# Patient Record
Sex: Female | Born: 1955 | Race: White | Hispanic: No | Marital: Single | Smoking: Never smoker
Health system: Southern US, Community
[De-identification: ages and names within clinical notes are randomized; demographics above are authoritative.]

## PROBLEM LIST (undated history)

## (undated) DIAGNOSIS — E785 Hyperlipidemia, unspecified: Secondary | ICD-10-CM

## (undated) DIAGNOSIS — F419 Anxiety disorder, unspecified: Secondary | ICD-10-CM

## (undated) DIAGNOSIS — E0590 Thyrotoxicosis, unspecified without thyrotoxic crisis or storm: Secondary | ICD-10-CM

## (undated) DIAGNOSIS — G4733 Obstructive sleep apnea (adult) (pediatric): Secondary | ICD-10-CM

## (undated) DIAGNOSIS — N183 Chronic kidney disease, stage 3 unspecified: Secondary | ICD-10-CM

## (undated) DIAGNOSIS — G4730 Sleep apnea, unspecified: Secondary | ICD-10-CM

## (undated) DIAGNOSIS — I4510 Unspecified right bundle-branch block: Secondary | ICD-10-CM

## (undated) DIAGNOSIS — F329 Major depressive disorder, single episode, unspecified: Secondary | ICD-10-CM

## (undated) DIAGNOSIS — E7800 Pure hypercholesterolemia, unspecified: Secondary | ICD-10-CM

## (undated) DIAGNOSIS — E079 Disorder of thyroid, unspecified: Secondary | ICD-10-CM

## (undated) DIAGNOSIS — Z9889 Other specified postprocedural states: Secondary | ICD-10-CM

## (undated) DIAGNOSIS — F32A Depression, unspecified: Secondary | ICD-10-CM

## (undated) DIAGNOSIS — J9621 Acute and chronic respiratory failure with hypoxia: Secondary | ICD-10-CM

## (undated) DIAGNOSIS — I517 Cardiomegaly: Secondary | ICD-10-CM

## (undated) DIAGNOSIS — J189 Pneumonia, unspecified organism: Secondary | ICD-10-CM

## (undated) DIAGNOSIS — I10 Essential (primary) hypertension: Secondary | ICD-10-CM

## (undated) DIAGNOSIS — K219 Gastro-esophageal reflux disease without esophagitis: Secondary | ICD-10-CM

## (undated) DIAGNOSIS — D860 Sarcoidosis of lung: Secondary | ICD-10-CM

## (undated) DIAGNOSIS — G4731 Primary central sleep apnea: Secondary | ICD-10-CM

## (undated) HISTORY — DX: Other specified postprocedural states: Z98.890

## (undated) HISTORY — PX: RHINOPLASTY: SUR1284

## (undated) HISTORY — DX: Hyperlipidemia, unspecified: E78.5

## (undated) HISTORY — PX: CARPAL TUNNEL RELEASE: SHX101

## (undated) HISTORY — PX: TONSILLECTOMY: SUR1361

## (undated) HISTORY — DX: Anxiety disorder, unspecified: F41.9

## (undated) HISTORY — DX: Major depressive disorder, single episode, unspecified: F32.9

## (undated) HISTORY — PX: APPENDECTOMY: SHX54

## (undated) HISTORY — PX: ELBOW FRACTURE SURGERY: SHX616

## (undated) HISTORY — DX: Unspecified right bundle-branch block: I45.10

## (undated) HISTORY — DX: Chronic kidney disease, stage 3 unspecified: N18.30

## (undated) HISTORY — DX: Essential (primary) hypertension: I10

## (undated) HISTORY — DX: Sleep apnea, unspecified: G47.30

## (undated) HISTORY — DX: Gastro-esophageal reflux disease without esophagitis: K21.9

## (undated) HISTORY — PX: RETROPERITONEAL LYMPH NODE EXCISION: SUR551

## (undated) HISTORY — DX: Disorder of thyroid, unspecified: E07.9

## (undated) HISTORY — DX: Pneumonia, unspecified organism: J18.9

## (undated) HISTORY — DX: Depression, unspecified: F32.A

## (undated) HISTORY — DX: Sarcoidosis of lung: D86.0

## (undated) HISTORY — DX: Chronic kidney disease, stage 3 (moderate): N18.3

## (undated) HISTORY — DX: Cardiomegaly: I51.7

## (undated) HISTORY — PX: CHOLECYSTECTOMY: SHX55

## (undated) HISTORY — DX: Obstructive sleep apnea (adult) (pediatric): G47.33

## (undated) HISTORY — DX: Pure hypercholesterolemia, unspecified: E78.00

## (undated) HISTORY — PX: OTHER SURGICAL HISTORY: SHX169

## (undated) HISTORY — DX: Acute and chronic respiratory failure with hypoxia: J96.21

## (undated) HISTORY — DX: Primary central sleep apnea: G47.31

## (undated) HISTORY — DX: Thyrotoxicosis, unspecified without thyrotoxic crisis or storm: E05.90

---
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
## 1983-08-02 HISTORY — PX: RHINOPLASTY: SUR1284

## 1984-08-01 HISTORY — PX: OTHER SURGICAL HISTORY: SHX169

## 1995-08-02 HISTORY — PX: OTHER SURGICAL HISTORY: SHX169

## 1996-08-01 HISTORY — PX: CHOLECYSTECTOMY: SHX55

## 1996-08-01 HISTORY — PX: DG GALL BLADDER: HXRAD326

## 1998-07-13 ENCOUNTER — Encounter: Admission: RE | Admit: 1998-07-13 | Discharge: 1998-09-11 | Payer: Self-pay | Admitting: Internal Medicine

## 1998-09-11 ENCOUNTER — Encounter: Admission: RE | Admit: 1998-09-11 | Discharge: 1998-10-15 | Payer: Self-pay

## 1999-01-04 ENCOUNTER — Other Ambulatory Visit: Admission: RE | Admit: 1999-01-04 | Discharge: 1999-01-04 | Payer: Self-pay | Admitting: Gynecology

## 1999-02-08 ENCOUNTER — Encounter: Admission: RE | Admit: 1999-02-08 | Discharge: 1999-02-08 | Payer: Self-pay | Admitting: Family Medicine

## 1999-04-20 ENCOUNTER — Encounter: Admission: RE | Admit: 1999-04-20 | Discharge: 1999-04-20 | Payer: Self-pay | Admitting: Family Medicine

## 1999-08-02 HISTORY — PX: OTHER SURGICAL HISTORY: SHX169

## 1999-10-11 ENCOUNTER — Ambulatory Visit (HOSPITAL_COMMUNITY): Admission: RE | Admit: 1999-10-11 | Discharge: 1999-10-11 | Payer: Self-pay | Admitting: Neurosurgery

## 1999-10-11 ENCOUNTER — Encounter: Payer: Self-pay | Admitting: Neurosurgery

## 1999-11-03 ENCOUNTER — Encounter: Payer: Self-pay | Admitting: Neurosurgery

## 1999-11-05 ENCOUNTER — Inpatient Hospital Stay (HOSPITAL_COMMUNITY): Admission: RE | Admit: 1999-11-05 | Discharge: 1999-11-06 | Payer: Self-pay | Admitting: Neurosurgery

## 1999-11-05 ENCOUNTER — Encounter: Payer: Self-pay | Admitting: Neurosurgery

## 1999-12-02 ENCOUNTER — Encounter: Admission: RE | Admit: 1999-12-02 | Discharge: 1999-12-02 | Payer: Self-pay | Admitting: Family Medicine

## 1999-12-06 ENCOUNTER — Encounter: Admission: RE | Admit: 1999-12-06 | Discharge: 1999-12-06 | Payer: Self-pay | Admitting: Neurosurgery

## 1999-12-06 ENCOUNTER — Encounter: Payer: Self-pay | Admitting: Neurosurgery

## 1999-12-16 ENCOUNTER — Encounter: Admission: RE | Admit: 1999-12-16 | Discharge: 1999-12-16 | Payer: Self-pay | Admitting: Family Medicine

## 2000-02-03 ENCOUNTER — Encounter: Admission: RE | Admit: 2000-02-03 | Discharge: 2000-02-03 | Payer: Self-pay | Admitting: Neurosurgery

## 2000-02-03 ENCOUNTER — Encounter: Payer: Self-pay | Admitting: Neurosurgery

## 2000-02-10 ENCOUNTER — Encounter: Admission: RE | Admit: 2000-02-10 | Discharge: 2000-02-10 | Payer: Self-pay | Admitting: Family Medicine

## 2000-03-17 ENCOUNTER — Other Ambulatory Visit: Admission: RE | Admit: 2000-03-17 | Discharge: 2000-03-17 | Payer: Self-pay | Admitting: Gynecology

## 2000-04-26 ENCOUNTER — Encounter: Admission: RE | Admit: 2000-04-26 | Discharge: 2000-04-26 | Payer: Self-pay | Admitting: Family Medicine

## 2000-09-07 ENCOUNTER — Encounter: Admission: RE | Admit: 2000-09-07 | Discharge: 2000-09-07 | Payer: Self-pay | Admitting: Neurosurgery

## 2000-09-07 ENCOUNTER — Encounter: Payer: Self-pay | Admitting: Neurosurgery

## 2000-10-24 ENCOUNTER — Encounter: Admission: RE | Admit: 2000-10-24 | Discharge: 2000-10-24 | Payer: Self-pay | Admitting: Family Medicine

## 2000-11-06 ENCOUNTER — Encounter: Admission: RE | Admit: 2000-11-06 | Discharge: 2000-11-06 | Payer: Self-pay | Admitting: Family Medicine

## 2001-01-15 ENCOUNTER — Encounter: Admission: RE | Admit: 2001-01-15 | Discharge: 2001-04-15 | Payer: Self-pay | Admitting: *Deleted

## 2001-01-18 ENCOUNTER — Encounter: Admission: RE | Admit: 2001-01-18 | Discharge: 2001-01-18 | Payer: Self-pay | Admitting: Family Medicine

## 2001-02-07 ENCOUNTER — Encounter: Admission: RE | Admit: 2001-02-07 | Discharge: 2001-02-07 | Payer: Self-pay | Admitting: Family Medicine

## 2001-03-08 ENCOUNTER — Encounter: Admission: RE | Admit: 2001-03-08 | Discharge: 2001-03-08 | Payer: Self-pay | Admitting: Family Medicine

## 2001-04-05 ENCOUNTER — Encounter: Admission: RE | Admit: 2001-04-05 | Discharge: 2001-04-05 | Payer: Self-pay | Admitting: Family Medicine

## 2001-08-22 ENCOUNTER — Encounter: Admission: RE | Admit: 2001-08-22 | Discharge: 2001-08-22 | Payer: Self-pay | Admitting: Family Medicine

## 2001-09-24 ENCOUNTER — Encounter: Admission: RE | Admit: 2001-09-24 | Discharge: 2001-09-24 | Payer: Self-pay | Admitting: Family Medicine

## 2002-01-02 ENCOUNTER — Encounter: Admission: RE | Admit: 2002-01-02 | Discharge: 2002-01-02 | Payer: Self-pay | Admitting: Family Medicine

## 2002-04-12 ENCOUNTER — Encounter: Admission: RE | Admit: 2002-04-12 | Discharge: 2002-04-12 | Payer: Self-pay | Admitting: Family Medicine

## 2002-04-13 ENCOUNTER — Encounter (INDEPENDENT_AMBULATORY_CARE_PROVIDER_SITE_OTHER): Payer: Self-pay | Admitting: *Deleted

## 2002-04-13 ENCOUNTER — Inpatient Hospital Stay (HOSPITAL_COMMUNITY): Admission: EM | Admit: 2002-04-13 | Discharge: 2002-04-19 | Payer: Self-pay | Admitting: Emergency Medicine

## 2002-04-14 ENCOUNTER — Encounter: Payer: Self-pay | Admitting: Sports Medicine

## 2002-04-15 ENCOUNTER — Encounter: Payer: Self-pay | Admitting: Sports Medicine

## 2002-04-16 ENCOUNTER — Encounter: Payer: Self-pay | Admitting: Sports Medicine

## 2002-04-23 ENCOUNTER — Encounter: Admission: RE | Admit: 2002-04-23 | Discharge: 2002-04-23 | Payer: Self-pay | Admitting: Family Medicine

## 2002-04-24 ENCOUNTER — Encounter: Admission: RE | Admit: 2002-04-24 | Discharge: 2002-04-24 | Payer: Self-pay | Admitting: Sports Medicine

## 2002-04-30 ENCOUNTER — Encounter: Admission: RE | Admit: 2002-04-30 | Discharge: 2002-04-30 | Payer: Self-pay | Admitting: Sports Medicine

## 2002-05-08 ENCOUNTER — Encounter: Admission: RE | Admit: 2002-05-08 | Discharge: 2002-05-08 | Payer: Self-pay | Admitting: Family Medicine

## 2002-05-24 ENCOUNTER — Encounter: Admission: RE | Admit: 2002-05-24 | Discharge: 2002-05-24 | Payer: Self-pay | Admitting: Family Medicine

## 2002-09-06 ENCOUNTER — Encounter: Admission: RE | Admit: 2002-09-06 | Discharge: 2002-09-06 | Payer: Self-pay | Admitting: Family Medicine

## 2002-09-09 ENCOUNTER — Encounter: Admission: RE | Admit: 2002-09-09 | Discharge: 2002-09-09 | Payer: Self-pay | Admitting: Family Medicine

## 2002-09-18 ENCOUNTER — Encounter: Admission: RE | Admit: 2002-09-18 | Discharge: 2002-09-18 | Payer: Self-pay | Admitting: Family Medicine

## 2002-10-02 ENCOUNTER — Encounter: Admission: RE | Admit: 2002-10-02 | Discharge: 2002-10-02 | Payer: Self-pay | Admitting: Family Medicine

## 2002-10-16 ENCOUNTER — Encounter: Payer: Self-pay | Admitting: Family Medicine

## 2002-10-16 ENCOUNTER — Encounter: Admission: RE | Admit: 2002-10-16 | Discharge: 2002-10-16 | Payer: Self-pay | Admitting: Family Medicine

## 2002-10-31 ENCOUNTER — Encounter (INDEPENDENT_AMBULATORY_CARE_PROVIDER_SITE_OTHER): Payer: Self-pay | Admitting: *Deleted

## 2002-10-31 DIAGNOSIS — J189 Pneumonia, unspecified organism: Secondary | ICD-10-CM

## 2002-10-31 HISTORY — DX: Pneumonia, unspecified organism: J18.9

## 2002-11-04 ENCOUNTER — Encounter: Admission: RE | Admit: 2002-11-04 | Discharge: 2002-11-04 | Payer: Self-pay | Admitting: Family Medicine

## 2002-11-04 ENCOUNTER — Encounter: Admission: RE | Admit: 2002-11-04 | Discharge: 2002-11-04 | Payer: Self-pay | Admitting: Sports Medicine

## 2002-11-04 ENCOUNTER — Encounter: Payer: Self-pay | Admitting: Sports Medicine

## 2002-11-08 ENCOUNTER — Emergency Department (HOSPITAL_COMMUNITY): Admission: EM | Admit: 2002-11-08 | Discharge: 2002-11-08 | Payer: Self-pay | Admitting: Emergency Medicine

## 2002-11-12 ENCOUNTER — Encounter (INDEPENDENT_AMBULATORY_CARE_PROVIDER_SITE_OTHER): Payer: Self-pay | Admitting: *Deleted

## 2002-11-12 ENCOUNTER — Encounter: Admission: RE | Admit: 2002-11-12 | Discharge: 2002-11-12 | Payer: Self-pay | Admitting: Sports Medicine

## 2004-02-25 ENCOUNTER — Encounter: Admission: RE | Admit: 2004-02-25 | Discharge: 2004-02-25 | Payer: Self-pay | Admitting: Family Medicine

## 2004-02-25 ENCOUNTER — Ambulatory Visit (HOSPITAL_COMMUNITY): Admission: RE | Admit: 2004-02-25 | Discharge: 2004-02-25 | Payer: Self-pay | Admitting: *Deleted

## 2004-11-12 ENCOUNTER — Encounter: Admission: RE | Admit: 2004-11-12 | Discharge: 2004-11-12 | Payer: Self-pay | Admitting: Orthopedic Surgery

## 2006-02-23 ENCOUNTER — Other Ambulatory Visit: Admission: RE | Admit: 2006-02-23 | Discharge: 2006-02-23 | Payer: Self-pay | Admitting: Obstetrics & Gynecology

## 2006-04-04 ENCOUNTER — Encounter: Admission: RE | Admit: 2006-04-04 | Discharge: 2006-04-04 | Payer: Self-pay | Admitting: Obstetrics & Gynecology

## 2006-07-01 HISTORY — PX: ABDOMINAL HYSTERECTOMY: SHX81

## 2006-07-10 ENCOUNTER — Encounter (INDEPENDENT_AMBULATORY_CARE_PROVIDER_SITE_OTHER): Payer: Self-pay | Admitting: Specialist

## 2006-07-10 ENCOUNTER — Inpatient Hospital Stay (HOSPITAL_COMMUNITY): Admission: RE | Admit: 2006-07-10 | Discharge: 2006-07-12 | Payer: Self-pay | Admitting: Obstetrics & Gynecology

## 2006-07-14 ENCOUNTER — Inpatient Hospital Stay (HOSPITAL_COMMUNITY): Admission: EM | Admit: 2006-07-14 | Discharge: 2006-07-15 | Payer: Self-pay | Admitting: Emergency Medicine

## 2006-08-16 ENCOUNTER — Emergency Department (HOSPITAL_COMMUNITY): Admission: EM | Admit: 2006-08-16 | Discharge: 2006-08-16 | Payer: Self-pay | Admitting: Emergency Medicine

## 2006-08-23 ENCOUNTER — Ambulatory Visit: Payer: Self-pay | Admitting: Gastroenterology

## 2006-08-24 ENCOUNTER — Encounter (INDEPENDENT_AMBULATORY_CARE_PROVIDER_SITE_OTHER): Payer: Self-pay | Admitting: Specialist

## 2006-08-24 ENCOUNTER — Ambulatory Visit: Payer: Self-pay | Admitting: Gastroenterology

## 2006-09-07 ENCOUNTER — Ambulatory Visit: Payer: Self-pay | Admitting: Gastroenterology

## 2006-09-28 DIAGNOSIS — J309 Allergic rhinitis, unspecified: Secondary | ICD-10-CM | POA: Insufficient documentation

## 2006-09-28 DIAGNOSIS — G4730 Sleep apnea, unspecified: Secondary | ICD-10-CM | POA: Insufficient documentation

## 2006-09-28 DIAGNOSIS — E782 Mixed hyperlipidemia: Secondary | ICD-10-CM | POA: Insufficient documentation

## 2006-09-28 DIAGNOSIS — L989 Disorder of the skin and subcutaneous tissue, unspecified: Secondary | ICD-10-CM | POA: Insufficient documentation

## 2006-09-28 DIAGNOSIS — M5382 Other specified dorsopathies, cervical region: Secondary | ICD-10-CM | POA: Insufficient documentation

## 2006-09-28 DIAGNOSIS — D869 Sarcoidosis, unspecified: Secondary | ICD-10-CM | POA: Insufficient documentation

## 2006-09-28 DIAGNOSIS — G4700 Insomnia, unspecified: Secondary | ICD-10-CM | POA: Insufficient documentation

## 2006-09-28 DIAGNOSIS — E118 Type 2 diabetes mellitus with unspecified complications: Secondary | ICD-10-CM | POA: Insufficient documentation

## 2006-09-28 DIAGNOSIS — E039 Hypothyroidism, unspecified: Secondary | ICD-10-CM | POA: Insufficient documentation

## 2006-09-28 DIAGNOSIS — I10 Essential (primary) hypertension: Secondary | ICD-10-CM | POA: Insufficient documentation

## 2006-09-28 DIAGNOSIS — F339 Major depressive disorder, recurrent, unspecified: Secondary | ICD-10-CM | POA: Insufficient documentation

## 2006-09-29 ENCOUNTER — Encounter (INDEPENDENT_AMBULATORY_CARE_PROVIDER_SITE_OTHER): Payer: Self-pay | Admitting: *Deleted

## 2009-02-03 ENCOUNTER — Encounter: Admission: RE | Admit: 2009-02-03 | Discharge: 2009-02-03 | Payer: Self-pay | Admitting: Family Medicine

## 2009-05-15 ENCOUNTER — Encounter: Admission: RE | Admit: 2009-05-15 | Discharge: 2009-05-15 | Payer: Self-pay | Admitting: Psychiatry

## 2009-05-16 ENCOUNTER — Encounter: Admission: RE | Admit: 2009-05-16 | Discharge: 2009-05-16 | Payer: Self-pay | Admitting: Psychiatry

## 2010-02-10 DIAGNOSIS — Z9889 Other specified postprocedural states: Secondary | ICD-10-CM

## 2010-02-10 HISTORY — DX: Other specified postprocedural states: Z98.890

## 2010-12-17 NOTE — Consult Note (Signed)
NAMEMELANNY, WIRE LU                          ACCOUNT NO.:  [id number]   MEDICAL RECORD NO.:  [id number]                   PATIENT TYPE:  INP   LOCATION:  3033                                 FACILITY:  [facility]   PHYSICIAN:  Oley Balm. Sung Amabile, M.D. [facility]          DATE OF BIRTH:  03-24-1956   DATE OF CONSULTATION:  04/16/2002  DATE OF DISCHARGE:                                   CONSULTATION   PULMONARY CONSULTATION   REQUESTING PHYSICIAN:  Dr. Garnette Scheuermann, [facility]  [facility].   REASON FOR CONSULTATION:  Mediastinal adenopathy and left lower lobe  infiltrate and nodules.   HISTORY OF PRESENT ILLNESS:  The patient is a 55 year old woman who presents  with a several-month history of profound fatigue, weight loss (almost 100  pounds over the past year), chronic cough (better after discontinuation of  ACE inhibitor), nausea, vomiting, anorexia.  She had routine labs done on  April 12, 2002.  These demonstrated severe hypercalcemia and renal  insufficiency.  Consequently, she was contacted on April 13, 2002 and  arrangements were made for admission.  Since admission she has been treated  for the hypercalcemia with some improvement.  Renal function is also  improving.  She was noted on admission chest x-ray to have what appeared to  be bulky mediastinal adenopathy.  This was followed up with a CT scan, the  results of are discussed below.  Based on the findings on chest radiographs,  I am asked to evaluate for diagnostic intervention.   PAST MEDICAL HISTORY:  1. Diabetes.  2. Hypothyroidism.  3. Hypercholesterolemia.  4. Obstructive sleep apnea.  5. Cervical spondylosis.   CURRENT MEDICATIONS:  Prior to admission these were aspirin, Moduretic,  Synthroid.   SOCIAL HISTORY:  She never smoked and has no history of significant  occupational exposures.   FAMILY HISTORY:  Noncontributory.  No history of malignancies.   PHYSICAL EXAMINATION:  GENERAL:  Very pleasant woman in no acute cardiac or  respiratory distress.  VITAL SIGNS: She is afebrile with normal vital signs.  Room air oxygen  saturation is 97%.  HEENT: No acute abnormalities.  NECK: Supple, without palpable adenopathy.  CHEST: Normal percussion throughout.  There are some faint left basilar  crackles otherwise, no other adventitious sounds.  CARDIAC: Regular rate and rhythm, with no murmurs.  ABDOMEN: Moderately obese and otherwise benign.  EXTREMITIES: Without clubbing, cyanosis, and edema.  NEUROLOGICAL: No focal deficits.  LYMPHATICS: Lymph node survey demonstrates no palpable lymphadenopathy.   DATA:  Chemistries have been reviewed and are normal except a BUN of 24 and  a creatinine of 2.6.  Ionized calcium is elevated at 1.86.  Her calcium  level today is coming down and is presently at 11.9.  On admission it was  13.6.  Erythrocyte sedimentation rate was modestly elevated at 32 mm/hr.   X-RAY DATA:  CT scan of the chest demonstrates bulky  mediastinal adenopathy.  There is airspace density in the left lower lobe medially at the base.  There are also nodular densities seen predominantly in the left lower lobe.  Mild splenomegaly is noted.   IMPRESSION:  1. Symptom complex of fatigue, weight loss, anorexia for several months.  2. Severe hypercalcemia of unclear etiology.  3. Acute renal failure - most likely secondary to the hypercalcemia.  4. Bulky mediastinal adenopathy with no palpable peripheral adenopathy.  5. Left lower lobe airspace disease and pulmonary nodules.   DISCUSSION:  Differential diagnosis is metastatic carcinoma versus lymphoma  versus sarcoidosis, versus any other granulomatous process including  tuberculosis.  I think sarcoidosis is the most likely explanation for her  constellation of symptoms and laboratory findings.   PLAN/RECOMMENDATIONS:  Bronchoscopy has been scheduled for this afternoon  and I intend to perform transbronchial needle  aspirations as well as left  lower lobe transbronchial biopsies.  If bronchoscopy is nondiagnostic,  mediastinoscopy would render a diagnosis with a biopsy of the mediastinal  lymph nodes.                                               Oley Balm Sung Amabile, M.D. [facility]    DBS/MEDQ  D:  04/16/2002  T:  04/17/2002  Job:  [id number]   cc:   Sibyl Parr. Fields, M.D.  [address] [place]  [place] [address]  Fax: [phone]

## 2010-12-17 NOTE — Discharge Summary (Signed)
Rachel Kim, Rachel Kim                 ACCOUNT NO.:  [id number]   MEDICAL RECORD NO.:  [id number]          PATIENT TYPE:  INP   LOCATION:  9317                          FACILITY:  [facility]   PHYSICIAN:  M. Leda Quail, MD  DATE OF BIRTH:  [birth year]   DATE OF ADMISSION:  07/10/2006  DATE OF DISCHARGE:  07/12/2006                               DISCHARGE SUMMARY   ADMISSION DIAGNOSIS:  15. 55 year old white female with menorrhagia.  2. Adenomyosis on ultrasound.  3. Failed conservative management with progestin therapy.  4. Obesity.  5. Diabetes.  6. Sleep apnea.  7. Hypertension.   DISCHARGE DIAGNOSIS:  88. 55 year old white female with menorrhagia.  2. Adenomyosis on ultrasound.  3. Failed conservative management with progestin therapy.  4. Obesity.  5. Diabetes.  6. Sleep apnea.  7. Hypertension.   PROCEDURE:  Total abdominal hysterectomy with bilateral salpingo-  oophorectomy.   HISTORY OF PRESENT ILLNESS:  Written H&P is in the chart.  Briefly, Ms.  Violante is a 55 year old white female with multiple medical problems  including hypertension, sleep apnea, diabetes, and obesity, who has had  significant menorrhagia with resulting anemia.  She has been worked up  with transvaginal ultrasound which has showed adenomyosis.  She did  undergo an endometrial biopsy and was started on simply progestin  therapy which did not improve her symptoms, at all.  We had talked about  conservative management but because of the findings of adenomyosis on  ultrasound, I was afraid that ablation may not work for her.  She has  decided to proceed with definitive management.  She has been cleared by  cardiology.   HOSPITAL COURSE:  The patient was admitted through same day surgery and  taken to the operating room where a TAH and BSO was performed.  The  surgery was difficult due to the patient's obesity, but was performed  without complications.  After the surgery, the patient was taken to the  recovery room and to the third floor for the remainder of her hospital  stay.  Her hospital course was uncomplicated.  On the morning of  postoperative day one, she was afebrile with stable vital signs.  She  had good pain control.  The Foley catheter was discontinued and she was  able to void without difficulty.  Postop hemoglobin was 11.2, white  count 12.7, electrolytes were normal with BUN 18 and creatinine 1.8.  This was a bump from her baseline of 18 and 1.3.  I did call the  pharmacy to call with them about any medication that she might be on  that could have contributed to this and discovered that Toradol was an  ACE inhibitor and can cause a bump in creatinine.  As a result, we  stopped her Toradol on the morning of postoperative day one.  She was on  Glucometer for 12 hours postop but had excellent blood sugar control.  From there, she was changed to an ADA diet and started on q.a.c. and  q.h.s. finger stick blood sugars with sliding scale insulin as  necessary.  She did have  good blood sugars throughout her  hospitalization.   By the evening of postoperative day one, she was voiding without  difficulty, ambulating well, tolerating a regular diet, and had  excellent pain control with oral pain medicines.  Her IV was completely  discontinued at this time.  She is off antibiotics and IV pain  medicines.  In the morning of postop day two, the patient was doing very  well.  Again, she was afebrile with stable vital signs.  Blood sugars  had been well controlled.  She had no trouble in the evening.  Repeat  BUN and creatinine were 19 and 1.6.  Because it was coming down, I felt  more reassured.  She was encouraged not to use Motrin at home and just  use a narcotic for pain medication.  I did talk with her primary care  physician.  We decided that we would recheck her BUN and creatinine in  one week to insure that it had normalized.   At this point, the patient had met all criteria  for discharge and was  discharged to home with friends.   Pathology showed proliferative endometrium with a benign endometrial  polyp, no hyperplasia or carcinoma identified, a submucosal leiomyoma  was noted, the cervix was unremarkable.  Right and left ovaries showed  hemorrhagic corpus luteal cyst without endometriosis or evidence of  malignancy and the fallopian tubes were unremarkable.      Lum Keas, MD  Electronically Signed     MSM/MEDQ  D:  08/09/2006  T:  08/09/2006  Job:  [id number]

## 2010-12-17 NOTE — Assessment & Plan Note (Signed)
[facility]                         GASTROENTEROLOGY OFFICE NOTE   Rachel Kim, MOLCHAN                        MRN:          [id number]  DATE:09/07/2006                            DOB:          [birth year]    This is a return office visit for a presumed infectious gastroenteritis.  Her symptoms resolved completely after a 7-day course of Cipro.  Gastric  biopsies of areas with mild erythema showed normal mucosa.  She has no  gastrointestinal complaints whatsoever, and feels well.  She has several  questions, which I attempted to answer for her.   CURRENT MEDICATIONS:  Listed on the chart - updated reviewed.   MEDIC ATON ALLERGIES:  CLEOCIN LEADING TO RASH AND ITCHING.   EXAMINATION:  Obese, white female, in no acute distress.  Weight 246 pounds.  Blood pressure is 120/78.  Pulse 72 and regular.  She is not reexamined.   ASSESSMENT AND PLAN:  1. Resolved infectious gastroenteritis.  2. Gastroesophageal reflux disease.  Symptoms well controlled on      Protonix.  May return to famotidine 20 mg daily once she completes      her current prescription of Protonix.  Maintain longterm and      antireflux measures.  3. Abnormal CT scan of jejunum.  Enteroscopy was negative.  No further      workup planned.  The jejunal findings were likely related to her      acute gastroenteritis.  4. Fatty infiltration of the liver.  She is advised to maintain      optimal control of her diabetes mellitus and begin a longterm      weight loss program with a low-fat diet to be supervised by her      primary physician.  Liver function tests obtained in January were      normal.  5. Colorectal cancer screening.  Colonoscopy is recommended in June      2008.     Venita Lick. Russella Dar, MD, [facility]  Electronically Signed    MTS/MedQ  DD: 09/07/2006  DT: 09/07/2006  Job #: [id number]   cc:   Brett Canales A. Cleta Alberts, M.D.

## 2010-12-17 NOTE — H&P (Signed)
Rachel Kim, Rachel Kim                          ACCOUNT NO.:  [id number]   MEDICAL RECORD NO.:  [id number]                   PATIENT TYPE:  INP   LOCATION:  3033                                 FACILITY:  [facility]   PHYSICIAN:  Sibyl Parr. Fields, M.D.                DATE OF BIRTH:  26-Mar-1956   DATE OF ADMISSION:  04/13/2002  DATE OF DISCHARGE:                                HISTORY & PHYSICAL   CHIEF COMPLAINT:  Hypercalcemia.   HISTORY OF PRESENT ILLNESS:  The patient is a 55 year-old white female with  hypothyroidism, diet controlled diabetes mellitus and obstructive sleep  apnea who presented with abdominal pain, vomiting and diarrhea for four  months to Dr. Rob Bunting at the [facility] on September 12.  Her  electrolytes were checked and calcium was reported at 14.1.  A call was  placed to the patient to come to the emergency room for evaluation.  In the  emergency room her calcium is 15.0 and her creatinine is 3.9.  The patient  seems quite accepting of her symptoms and does not appear overly concerned  at this point.  She states she only keeps down fluids.  She denies any  hematemesis or melena.   REVIEW OF SYMPTOMS:  No fevers.  No headaches.  No chest pain.  No shortness  of breath.  She does complain of pruritus.  No complaints of joint or muscle  pain.  No complaints of depression or hallucinations.  No dysuria or  hematuria.  She does complain of a weight loss of 80 pounds over the last  one and a half years and otherwise her review of systems is as above.   PAST MEDICAL HISTORY:  1. Hypothyroidism.  2. Diabetes mellitus.  3. Hypercholesterolemia.  4. Obesity.  5. History of depression.  6. Hypertension.  7. Allergic rhinitis.  8. Obstructive sleep apnea.  She uses CPAP at night.   MEDICATIONS:  1. Aspirin 81 q.d.  2. Moduretic 5/50 mg q.d.  3. Synthroid 150 mcg q.d.  4. Benadryl p.r.n.  5. Dramamine p.r.n. nausea.  6. Melatonin one q.h.s.  The patient  recently stopped that.   ALLERGIES:  CLEOCIN.   SOCIAL HISTORY:  She lives alone.  She is single.  She lives in [place]  and has two cats. She denies any alcohol, smoking or drugs.   FAMILY HISTORY:  Her father has coronary artery disease, hypothyroidism and  is status post a CABG and also has laryngeal cancer.  Mother has  hypothyroidism.  All of her siblings have hypothyroidism.   OBJECTIVE:  VITAL SIGNS:  Temperature 97.5, heart rate 117, blood pressure  115/81, respirations 18.  GENERAL:  She is alert, pleasant and in no acute distress.  HEENT:  Pupils equal, round and reactive to light.  Extraocular muscle are  intact.  Tympanic membranes are clear bilaterally.  Oropharynx has moist  mucous membranes and is clear.  Neck:  No lymphadenopathy. No JVD.  She does  have an enlarged thyroid bilaterally.  LUNGS: Lungs are clear to auscultation bilaterally with good breath sounds.  CARDIOVASCULAR:  Tachycardia but regular rhythm.  No murmurs, rubs or  gallops.  ABDOMINAL EXAM:  Soft with positive bowel sounds. There is some slight  tenderness to deep palpation suprapubically.  There is no hepatosplenomegaly  and no masses.  LOWER EXTREMITIES:  No cyanosis, clubbing or edema.  She has no joint  swelling or erythema.  NEUROLOGIC: Cranial nerves were grossly intact.  She has good strength  bilaterally and no clonus.   LABORATORY DATA:  Sodium 134, potassium 4.2, chloride 95, bicarbonate 26,  BUN 44, creatinine 3.9, glucose 87. Calcium 15.4.   EKG showed normal sinus rhythm with a right bundle branch block.  There are  no comparisons.   ASSESSMENT AND PLAN:  This is a 55 year-old with hypercalcemia and acute  renal failure.  1. Hypercalcemia.  The etiologies include increased ingestion of calcium,     hyperparathyroidism, malignancy, hydrochlorothiazide use, sarcoidosis,     hyperthyroidism, adrenal insufficiency and pheochromocytoma.  I suspect     either increase  hyperparathyroidism versus hydrochlorothiazide use versus     sarcoidosis.  I feel that malignancy is the least likely of the four.  I     also feel sarcoid is unlikely in an asymptomatic white female.  She does     have a history of hypothyroidism, so thyrotoxicosis is unlikely. Adrenal     insufficiency is a possibility in a patient with fatigue, vomiting and     low blood pressure, but I would expect significant hyponatremia and     hyperkalemia.  Pheochromocytoma would also cause unstable vital signs     which she does not have.  The plan is to check vitamin B level, ionized     calcium, magnesium, phosphorus and PTH.  We will check a sedimentation     rate to evaluate for malignancy.  We will stop her hydrochlorothiazide     and give IV fluid hydration.  When her creatinine trends downward we will     also add Lasix to help remove the calcium.  We will follow strict I's and     O's and monitor her BMP's and we will also check a chest x-ray to     evaluate for sarcoidosis.  2. Acute renal failure.  Her last creatinine was 1.5 in August.  Her     creatinine one year ago was 1.0.  It is now 3.9.  I do not expect even a     creatinine of 1.5 in this patient.  We will check a urinalysis for     protein and infection. We will check a renal ultrasound to rule out     stones.  We will check an FENA and hold her aspirin.  I hope hydration     will improve her creatinine because I suspect this is pre-renal azotemia,     however, she may have asymptomatic nephrolithiasis in which case the IV     fluids may not help bring the creatinine down. If she does have     proteinuria, I recommend a 24 hour urine in the setting of diabetes and     hypertension.  3. Diabetes mellitus.  This is diet controlled.  She does have a history of     Glucophage use.  4. Hypertension.  This is  currently controlled.  Since her blood pressure is    low we will hold her Moduretic in the face of hypercalcemia.  We will  not     cover her blood pressure for now since it is low.  5. Pruritus.  She does complain of pruritus for the last three days.  We     will cover her with Compazine since she did not tolerate Phenergan.  6. Hypercholesterolemia.  This is not being medically treated. We can follow     this up as an outpatient.     Lisa A. Rodman Pickle, MD                       Sibyl Parr Darrick Penna, M.D.   LAC/MEDQ  D:  04/17/2002  T:  04/17/2002  Job:  [id number]   cc:   Billey Gosling, M.D.

## 2010-12-17 NOTE — H&P (Signed)
[facility]  Patient:    Rachel Kim, Rachel Kim [facility]                       MRN: [id number] Adm. Date:  [id number] Attending:  Danella Penton                         History and Physical  HISTORY OF PRESENT ILLNESS:  Rachel Kim is a 55 year old right-handed lady who as seen in 1996 and had been complaining of neck pain with radiation to the left shoulder, going to the left arm, associated with numbness and tingling sensation. Patient underwent carpal tunnel surgery bilaterally.  She is having good days and bad days but now, the pain is getting worse.  She was seen by me about a month go and the diagnosis of cervical spondylosis producing the radiculopathy was done.  Patient was advised to have conservative treatment including epidural injections, but she declined.  PAST MEDICAL HISTORY:  Tonsillectomy, appendectomy, infected lymph node from the right axilla, broken elbow, rhinoplasty, L5-S1 laminectomy for a disc with numbness of the right leg and then cholecystectomy and surgery of the knee.  ALLERGIES:  She is allergic to CLEOCIN.  SOCIAL HISTORY:  Patient does not smoke and does not drink.  She is 5 feet 5 inches and she weighs 252 pounds.  MEDICATION:  Patient is taking thyroid medication.  FAMILY HISTORY:  Mother is 66 with high blood pressure and father is in good condition.  REVIEW OF SYSTEMS:  Positive for arm weakness, high blood pressure and thyroid disease.  PHYSICAL EXAMINATION:  HEENT:  Normal.  NECK:  There are no bruits.  She is able to flex but extension and lateralization produce discomfort.  LUNGS:  Clear.  HEART:  Heart sounds normal.  ABDOMEN:  Normal.  EXTREMITIES:  Normal pulses.  NEUROLOGIC:  She is oriented x 3.  Cranial nerves normal.  Strength in the right upper extremity is normal.  In the left upper extremity, I can find weakness of the left biceps and left wrist extensor.  Thenar and hypothenar  muscles are normal.  Reflexes are 2+.  There is normal sensation with a decrease of the L5-S1 nerve oot in the right side from previous surgery.  BACK:  There is a lumbar scar from previous surgery.  IMAGING STUDIES:  The MRI show a C5-6 spondylosis, central and to the left; also, she has an incidental spondylosis at the level of 4-5 and 3-4.  CLINICAL IMPRESSION: 1. C6 radiculopathy, chronic, secondary to cervical spondylosis. 2. Incidental spondylosis at the level of 3-4, 4-5.  RECOMMENDATION:  I talked to Rachel Kim at length.  She declined more conservative treatment including epidural injections.  She wanted to go ahead with surgery. We talked about doing surgery at one level versus three levels; at the end, she agreed only to go ahead with the one level; we fully agreed because she is asymptomatic in the other ones.  She knows that in the future, she has a 10% that C4-5 and 3-4 ill get worse.  The risks with the surgery itself are the possibility of worsening he pain, paralysis, need for further surgery, damage to vocal cords, damage to the  esophagus, damage to the trachea, the possibility of stroke and all the risks associated with her obesity. DD:  11/05/99 TD:  11/06/99 Job: [id number]

## 2010-12-17 NOTE — Op Note (Signed)
NAMESRAH, AKE LU                          ACCOUNT NO.:  [id number]   MEDICAL RECORD NO.:  [id number]                   PATIENT TYPE:  INP   LOCATION:  3033                                 FACILITY:  [facility]   PHYSICIAN:  Oley Balm. Sung Amabile, M.D. [facility]          DATE OF BIRTH:  1955-09-21   DATE OF PROCEDURE:  04/16/2002  DATE OF DISCHARGE:                                 OPERATIVE REPORT   INDICATIONS:  1. Bulky mediastinal adenopathy.  2. Left lower lobe infiltrate.  3. Pulmonary nodules.  4. Rule out sarcoidosis.   SEDATION:  Fentanyl 100 mcg, Versed 8 mg.   ANESTHESIA:  Topical anesthesia was applied to the nose and throat and 30 cc  of 1% lidocaine were used during the course of the procedure.   PROCEDURE:  After adequate sedation and anesthesia, the bronchoscope was  introduced via the right nares and advanced into the posterior pharynx.  This demonstrated normal upper airway anatomy.  The vocal cords moved  symmetrically.  There was a cystic-looking structure in the left piriform  sinus.  After completion of the upper airway examination, further anesthesia  was achieved with 1% lidocaine and the scope was advanced into the trachea.  Complete airway anesthesia was achieved with 1% lidocaine and an airway  examination was performed.  This demonstrated normal segmental anatomy.  The  airways were diffusely edematous.  There were no endobronchial tumors,  masses or foreign bodies.  There was no endobronchial obstruction.  There  were minimal secretions.   After completion of the airway examination, the bronchoscope was advanced in  the right middle lobe where bronchioalveolar lavage was performed.  The  effluent was clear.  Approximately 30 cc returned.  Subsequently to this the  bronchoscope was retracted to the main carina where transbronchial needle  aspirations were performed.  Two passes of the needle were performed.  Subsequent to this, transbronchial needle aspiration  was also performed at  the secondary carina on the left side with two needle passes.  Subsequent to  this the bronchoscope was advanced into the left lower lobe airways and  transbronchial biopsies were performed under fluoroscopic guidance in  multiple left basilar segments.  The patient tolerated the procedure well  with moderate bleeding.  This subsided by the end of  the procedure.  The procedure was also complicated by a moderate cough.  Her  chest was scanned with fluoroscopy after completion of the procedure and  there was no evidence of pneumothorax.  The above specimens were sent for  AFB studies, cytology and surgical pathology.                                               Oley Balm Sung Amabile, M.D. [facility]    DBS/MEDQ  D:  04/16/2002  T:  04/17/2002  Job:  [id number]   cc:   Sibyl Parr. Fields, M.D.  [address] [place]  [place] [address]  Fax: [phone]

## 2010-12-17 NOTE — Op Note (Signed)
[name], [name]                 ACCOUNT NO.:  [id number]   MEDICAL RECORD NO.:  [id number]          PATIENT TYPE:  INP   LOCATION:  9317                          FACILITY:  [facility]   PHYSICIAN:  M. Leda Quail, MD  DATE OF BIRTH:  [birth year]   DATE OF PROCEDURE:  07/10/2006  DATE OF DISCHARGE:                               OPERATIVE REPORT   PREOPERATIVE DIAGNOSES:  58. A 55 year old G0 white female with menorrhagia.  2. Adenomyosis and polyp on ultrasound.  3. Hypertension.  4. Diabetes.  5. Obesity.  6. Sarcoidosis.  7. Hypothyroidism.   POSTOPERATIVE DIAGNOSES:  47. A 55 year old G0 white female with menorrhagia.  2. Adenomyosis and polyp on ultrasound.  3. Hypertension.  4. Diabetes.  5. Obesity.  6. Sarcoidosis.  7. Hypothyroidism.   PROCEDURE:  Total abdominal hysterectomy and bilateral salpingo-  oophorectomy.   SURGEON:  M. Leda Quail, MD.   ASSISTANTAram Beecham P. Romine, M.D.   ANESTHESIA:  General endotracheal anesthesia.   SPECIMENS:  Uterus, cervix, tubes and ovaries to pathology.   ESTIMATED BLOOD LOSS:  300 mL.   URINE OUTPUT:  500 mL of clear urine in the Foley catheter.   FLUIDS:  2700 mL of LR.   COMPLICATIONS:  None.   INDICATIONS FOR PROCEDURE:  Rachel Kim is a 55 year old G0, single white  female with history of menorrhagia which has resulted in history of  anemia.  She has undergone work-up with an endometrial biopsy which  showed proliferative endometrioma as well as an ultrasound that showed  diffuse adenomyosis and a very small 4 mm endometrial polyp.  She was  initially hoping for an HTA, but after being counseled that adenomyosis  is one of the common reasons of failure, she has opted to proceed with  more definitive management, specifically hysterectomy.  She tried cyclic  progesterone to hope that this would improve the bleeding before she  decided to do anything surgical and after three months of continued  heavy bleeding, she  opted for definitive management.   DESCRIPTION OF PROCEDURE:  Patient is taken to the operating room.  She  is placed in the supine position.  General endotracheal anesthesia is  administered by the anesthesia staff.  Legs are positioned in the  froglegged position.  Abdomen, perineum, inner thighs and vagina were  prepped in normal sterile fashion.  A Montgomery strap was placed above  the umbilicus and attached to an ether screen to lift the abdomen  slightly.  Foley catheter was inserted under sterile conditions.  Legs  are straightened and patient was draped in normal sterile fashion.   After the operative assistant had scrubbed sterilely and gowned and  gloved, a Pfannenstiel skin incision was made and carried down through  subcutaneous fat and tissue.  Fascia was identified and nicked in the  midline.  Fascial incision was extended laterally using curved Mayo  scissors.  Kocher clamps were applied the suppress the edge of the  fascial incision and the rectus muscles were dissected off sharply and  bluntly.  Cautery was used to obtain excellent hemostasis.  In a similar  fashion, Kocher clamps were applied to the inferior aspect of the  fascial incision and the underlying rectus muscles dissected off  sharply.  No bleeding was noted.  There was a small muscle diastasis in  the middle.  The muscles were divided completely in the midline then  superiorly.  The preperitoneal fat was divided and then peritoneum was  identified, elevated with hemostats and entered sharply.  Peritoneal  incision was then incised superiorly and inferiorly with care taken  inferiorly to note the location of the bladder.  At this point, several  attempts were made to gain the best visualization possible with  different retractors.  Ultimately, a Balfour retractor was used with  short blades.  Care was taken to make sure the blades were not pressing  on the psoas muscles.  A upper arm extender was then  applied to the  Balfour retractor.  Bladder blade was placed inferiorly and the bowel  was packed with four moistened laparotomy sponges.  Then the malleable  retractor was attached to the upper blade to help the bowel out of the  way.  There was a small amount of adhesions from the bowel to the left  fallopian tube and side wall.  These were taken down sharply.   Kelly clamps were applied to either side of the uterus and the uterus  was elevated.  The round ligaments were suture ligated bilaterally.  The  anterior leaf of the broad ligament was opened and carried to the  midline of the cervix at the level of the internal os.  In a similar  fashion, this was done on the patient's left side.  The posterior leaf  of the broad ligament was then opened on the right side.  The ureter was  identified.  The IP ligament was isolated.  Curved Heaney was placed  across the right IP ligament.  This pedicle was transected.  The IP  ligament was suture ligated first with a free tie of 0 Vicryl and second  with stich of 0 Vicryl.  Then, in a similar fashion, attempt was made to  isolate the left IP ligament, however, the ovary felt adhesed to the  sidewall and because of the difficulty with visualization, decision was  made to go ahead and cross the left utero-ovarian ligament.  The left  ureter was identified before doing this.  The utero-ovarian ligament was  clamped, transected and suture ligated first with free tie of 0 Vicryl  and second stitched with a stich of 0 Vicryl.  Beneath this, the bladder  flap was created by sharp dissection of the filmy tissue between the  bladder and the cervix.  Using a sponge stick, the bladder was  progressed down the cervix with the glistening white tissue of the  cervix well visualized.  Uterine arteries were identified bilaterally,  clamped with curved Heaney clamp, transected and suture ligated with 0 Vicryl.  Further dissection of the bladder flap was  performed as  necessary.  Cardinal ligaments were then serially clamped, cut, and  suture ligated with 0 Vicryl.  At least 8-10 clamps on each side of the  cervix were necessary as visualization was difficult to the amount of  fat that was present in the pelvis.  Curved Deaver retractors were also  used to help visualize the pedicles better.  Care was taken with each  clamp to ensure that the bladder was progressed further down the cervix.  Once the uterosacral ligaments  were clamped bilaterally, transected and  Heaney sutured with 0 Vicryl, a curved Heaney clip could be placed  around the right angle of the cervix.  This was transected and the  vagina was entered.  This pedicle on the right side was suture ligated  with Heaney suture of 0 Vicryl.  It was as well left long.  Using  Satinsky scissors and hugging the cervix, vaginal mucosa was  circumscribed sharply.  Then the cervix, uterus, right ovary and  fallopian tube were handed off to be sent to pathology.  The corners of  the vaginal cuff were sutured to ensure that the angles were above the  level of the sutures.  These were transfixed to the lateral uterosacral  ligaments.  The remainder of the vaginal cuff was closed with four  additional sutures of 0 Vicryl.   Pelvis was irrigated with copious amounts of warm normal saline.  Small  amount of bleeding on the posterior aspect of the vaginal cuff and this  was made hemostatic with figure-of-eight suture of 0 Vicryl.  At this  point, the left ovary could be freed from the pelvic sidewall without  difficulty.  Again, the ureter was identified and then the IP ligament  on the left side was clamped with a curved Heaney clamp.  The pedicle  was transected, suture ligated with a free tie of 0 Vicryl and second  with a stitch tie of 0 Vicryl.  Again, the cuff was irrigated with warm  normal saline.  No bleeding was noted.  A Gelfoam was placed across the  vaginal cuff.  The IP ligaments  were visualized, no bleeding was noted.  All pedicle suture were cut short.  At this point, the retractor,  bladder blade and upper blade were removed from the abdomen.  The four  moistened laparotomy sponges were removed.  The bowel was placed back in  normal anatomic position.  The peritoneum was closed with a running  stitch of 0 Vicryl.  The subfascial tissue and rectus muscles were  visualized.  No bleeding was noted.  A subfascial on-Q pump catheter was  placed on the patient's right side.  Then the fascia was closed with a 0  Vicryl starting at the midline and running to the midline bilaterally.  The subcutaneous fat and tissue was irrigated and cleared of clot and  debris and no bleeding was noted.  A second on-Q pump tubing was placed  on the left side above the incision and this was placed subcutaneously.  The on-Q pump tubing was attached to the skin using Steri-Strips.  Then the skin was closed with a subcuticular stitch of 3-0 Vicryl.  Skin was  cleansed.  Benzoin and Steri-Strips were applied.  We instilled 5 mL of  1% lidocaine subcutaneously and 10 mL of 1% lidocaine was instilled  subfascially.   Sponge, lap, needle and instrument counts correct x2.  The patient  tolerated the procedure very well.  She was extubated without difficulty  and taken to the recovery room in stable condition.  Although the  procedure went well, this TAH/BSO required 3 1/2 hours to complete due  to the required to achieve adequate visualization while beginning the  procure.  This difficulty was present as a result of the patient's  obesity.  Additional curved Dever retractors were needed during the  procedure to retract fatty tissue in the pelvis for better  visuazliation.  A difficulty factor is added to the procedure.  Lum Keas, MD  Electronically Signed     MSM/MEDQ  D:  07/10/2006  T:  07/10/2006  Job:  [id number]

## 2010-12-17 NOTE — Discharge Summary (Signed)
Rachel Kim, Rachel Kim                 ACCOUNT NO.:  [id number]   MEDICAL RECORD NO.:  [id number]          PATIENT TYPE:  INP   LOCATION:  3702                         FACILITY:  [facility]   PHYSICIAN:  Darlin Priestly, MD  DATE OF BIRTH:  04/19/1956   DATE OF ADMISSION:  07/14/2006  DATE OF DISCHARGE:  07/15/2006                               DISCHARGE SUMMARY   DISCHARGE DIAGNOSES:  1. Elevated CK, negative MB relative index and negative troponin this      admission.  2. Recent negative Cardiolite on June 23, 2006.  3. Chronic right bundle branch block.  4. Non-insulin diabetes.  5. Treated hypertension.  6. Sarcoidosis.  7. Status post recent hysterectomy with transient renal insufficiency      postoperatively; creatinine 1.3 on this admission.   HOSPITAL COURSE:  Rachel Kim is a 55 year old female who was seen in our  office for a preoperative Cardiolite study on June 23, 2006.  She  had probable breast attenuation with good LV function.  It was a low-  risk study.  The patient has a chronic right bundle branch block.  The  patient underwent hysterectomy and then afterwards had a bump in her  creatinine to 1.8 postoperatively.  She was referred to her family  doctor for further evaluation.  She was sent to [facility] because  her EKG was abnormal.  CK in the emergency room was elevated, although  her MB fraction was negative for ischemia or MI.  The patient was  admitted for overnight observation, and subsequent CK MBs were negative.  Troponins were negative x2.  The patient has never had chest pain or  dyspnea.  We feel the patient can be discharged on July 15, 2006.  She will follow up with Dr. Jenne Campus as an outpatient, as well as her  family doctor.   DISCHARGE MEDICATIONS:  Similar to her admission medicines which are:  1. Synthroid 0.137 mg a day.  2. Metformin 1 gram b.i.d.  3. Aspirin 81 mg a day.  4. Lasix 20 mg twice a day.   LABORATORY DATA:  White  count 6.9, hemoglobin 12, hematocrit 36.1,  platelets 306.  Sodium 134, potassium 3.1, BUN 11, creatinine 1.3.  Liver functions were normal.  CKs are 221 and 297 with an MB of 3.1 and  4.3, which were negative up for an MI.  Troponins were 0.03 and 0.02.  Hemoglobin A1c is 7.1.  EKG shows sinus rhythm with a right bundle  branch block which is consistent with previous EKGs.   PLAN:  The patient is discharged stable condition.  She will follow up  with her primary care doctor and Dr. Jenne Campus as an outpatient.      Abelino Derrick, P.A.      Darlin Priestly, MD  Electronically Signed    LKK/MEDQ  D:  07/15/2006  T:  07/15/2006  Job:  [id number]

## 2010-12-17 NOTE — Assessment & Plan Note (Signed)
[facility]                         GASTROENTEROLOGY OFFICE NOTE   MEIRA, WAHBA                        MRN:          [id number]  DATE:08/23/2006                            DOB:          Mar 24, 1956    OFFICE CONSULTATION:   REASON FOR REFERRAL:  Epigastric pain, nausea, vomiting, hematemesis and  diarrhea.   HISTORY OF PRESENT ILLNESS:  Ms. Witthuhn is a 55 year old white female  referred through the courtesy of Dr. Cleta Alberts.  She was previously evaluated  by Dr. Kinnie Scales for a history of protracted vomiting associated with right-  sided abdominal pain in 1998.  She ultimately underwent cholecystectomy  for chronic acalculous cholecystitis.  She had a prior upper endoscopy  by Dr. Bernette Redbird in January 1998 that showed a small hiatal hernia.  She underwent ERCP by Dr. Sharrell Ku in May 1998, which was normal.  She is status post a total abdominal hysterectomy with bilateral  salpingo-oophorectomy by Dr. Corrie Mckusick on July 10, 2006.  She  states she was in the hospital 2 nights and has had an excellent  recovery.  On January 14 she had the sudden onset of epigastric and  periumbilical abdominal pain associated with nausea, vomiting and  diarrhea.  She was seen at an urgent care center on January 15 and then  [facility] emergency room on January 16.  CBC, coagulation studies and  BMET were unremarkable except for a minimally elevated creatinine of  1.22 and an elevated glucose at 170.  CT scan of the abdomen and pelvis  on August 16, 2006, showed multifocal wall thickening in the jejunum,  which was felt most likely to represent peristaltic activity but in  light of scattered lymph nodes, enteritis was considered a possibility  with the less likely possibility of a small bowel mass.  Diffuse fatty  infiltration of the liver, lower lumbar spondylosis at L5-S1, mild  sigmoid colon diverticulosis and a prior hysterectomy were noted.  She  has been tried on Protonix with no significant improvement in symptoms.  She can tolerate liquids but generally has significant epigastric pain  and vomiting following solid food.  She has had loose, watery stool with  one episode of small amounts of bright red blood with a bowel movement.  She has noted hematemesis of a small volume on one occasion.  She has no  dysphagia or odynophagia.  Her father was diagnosed with ulcerative  colitis at age 46 and a maternal grandmother was diagnosed with colon  cancer in her late 39s.  She has not used NSAIDs or anti-inflammatories  for the past 6-8 weeks as recommended by her gynecologist and she did  not use anti-inflammatories regularly prior to that.   PAST MEDICAL HISTORY:  1. Hypertension.  2. Diabetes mellitus.  3. Hypothyroidism.  4. Depression.  5. Sleep apnea.  6. Status post cholecystectomy in 1998.  7. Status post appendectomy in 1970.  8. Status post total abdominal hysterectomy with bilateral salpingo-      oophorectomy 2007.   CURRENT MEDICATIONS:  Listed on the chart, updated and reviewed.  MEDICATION ALLERGIES:  CLEOCIN, leading to a rash and itching.   SOCIAL HISTORY:  Per the handwritten form.   REVIEW OF SYSTEMS:  Per the handwritten form.   PHYSICAL EXAMINATION:  GENERAL:  An obese white female in no acute  distress.  VITAL SIGNS:  Height 5 feet 5 inches, weight 240 pounds.  Blood pressure  is 120/84, pulse 76 and regular.  HEENT:  Anicteric sclerae.  Oropharynx clear.  CHEST:  Clear to auscultation bilaterally.  CARDIAC:  Regular rate and rhythm without murmurs appreciated.  ABDOMEN:  Soft with mild epigastric and periumbilical tenderness to deep  palpation.  No rebound or guarding.  No palpable organomegaly or mass.  No hernias.  Normoactive bowel sounds.  EXTREMITIES:  Without clubbing, cyanosis, or edema.  NEUROLOGIC:  Alert and oriented x3.  Grossly nonfocal.   ASSESSMENT AND PLAN:  Nausea, vomiting,  diarrhea, midabdominal pain and  an abnormal CT scan of the jejunum, question disease process versus  peristalsis.  Rule out infectious enteritis.  Rule out ulcer disease.  Continue Protonix 40 mg daily and begin Cipro 500 mg b.i.d. for 10 days.  Schedule upper endoscopy.  Risks, benefits, and alternatives to upper  endoscopy with possible biopsy discussed with the patient, and she  consents to proceed.  This will be scheduled electively.  If no  diagnosis is established, will consider proceeding with a small bowel  follow-through and/or capsule endoscopy to further evaluate.     Venita Lick. Russella Dar, MD, [facility]  Electronically Signed    MTS/MedQ  DD: 08/23/2006  DT: 08/23/2006  Job #: [id number]   cc:   Brett Canales A. Cleta Alberts, M.D.

## 2010-12-17 NOTE — Discharge Summary (Signed)
Rachel Kim, Rachel Kim                          ACCOUNT NO.:  [id number]   MEDICAL RECORD NO.:  [id number]                   PATIENT TYPE:  INP   LOCATION:  3033                                 FACILITY:  [facility]   PHYSICIAN:  Leighton Roach McDiarmid, M.D.             DATE OF BIRTH:  June 15, 1956   DATE OF ADMISSION:  04/13/2002  DATE OF DISCHARGE:  04/19/2002                                 DISCHARGE SUMMARY   ADMISSION DIAGNOSES:  1. Hypercalcemia.  2. Acute renal failure.  3. Diabetes mellitus.  4. Hypertension.   DISCHARGE DIAGNOSES:  1. Sarcoidosis.  2. Diabetes mellitus.  3. Hypertension.   CONSULTATIONS:  Pulmonology, Dr. Sung Amabile and Dr. Jayme Cloud on April 16, 2002.   PROCEDURE:  Bronchoscopy with bronchial washing and fine-needle aspiration  with multiple biopsies on April 16, 2002.   HISTORY OF PRESENT ILLNESS:  The patient is a 55 year old female who was  seen by her primary care physician on September 12, complaining of abdominal  pain, nausea and vomiting x4 months.  Electrolytes were checked and the  potassium was noted to be 14.1.  The patient was notified when the results  came to the clinic and was requested to report to the emergency department.  On recheck there, calcium was 15, creatinine 3.9.  History was also  significant for pruritus and an 80 pound weight loss over 1-1/2 years that  was unintentional.   PHYSICAL EXAMINATION:  Physical exam at the time of admission was benign,  except for that the patient was mildly tachycardic at 117.   LABORATORY DATA AND X-RAY FINDINGS:  EKG showed a normal sinus rhythm and a  right bundle branch block.   HOSPITAL COURSE:  The patient was admitted for treatment of hypocalcemia and  acute renal failure.  While the etiology of the hypercalcemia was being  explored, the patient received IV hydration and Lasix to control the  hypercalcemia.  A chest x-ray on September 14, showed bilateral hilar  adenopathy suggestive  of sarcoidosis or lymphoma.  In lieu of the patient's  significant weight loss, cancer as well as sarcoidosis were being strongly  considered.  A pulmonary consult was placed.  Dr. Sung Amabile saw the patient  and recommended that a bronchoscopy with washings and biopsy is appropriate  to be performed.  Several biopsies were taken on September 16, and bronchial  washings were collected.  No malignancy was noted on either the washing or  the pathology of the biopsies.  Stains were run to evaluate for tuberculosis  and the first report was negative.  Biopsies showed granuloma which was  consistent with sarcoidosis.  It was decided to begin treatment of the  sarcoidosis empirically before all the tests were finalized as the patient  continued to be hypercalcemic which did qualify her for therapy.  The  patient was started on 20 mg of prednisone p.o. q.d.  The patient's acute  renal insufficiency improved with steady hydration.  A renal ultrasound was  obtained that did not show any pathology.  It would be likely that the  insufficiency was due to either the hypercalcemia or potentially from  sarcoidosis infiltration to the kidney.  This is something that can be  further explored as an outpatient.  Of note, multiple myeloma was safely  ruled out with urine and serum protein electrophoresis.  Both of these  examinations were negative.  Also, a CT of the thorax had further concerns  of the mediastinal lymph nodes and hilar nodules as well as multiple  bilateral pulmonary nodules.   The patient's hypertension, although initially well controlled, became  elevated later on her admission.  As the Dyazide diuretic that she had  previously been treated with was held due to her hypercalcemia, the patient  was subsequently restarted on metoprolol 12.5 mg b.i.d.  The patient's  thyroid function was also checked and her TSH was low suggesting an  iatrogenic hypothyroidism.  The patient's Synthroid was  decreased to 125  mcg.  At the time of discharge, the patient was comfortable.  Previous  symptoms of nausea and vomiting had long subsided.  The renal function was  much improved and therapy for sarcoidosis had been initiated with  prednisone.   CONDITION ON DISCHARGE:  Stable.   DISPOSITION:  The patient was discharged to home.   DISCHARGE MEDICATIONS:  1. Prednisone 20 mg one tablet p.o. q.d. with food.  2. Metoprolol 12.5 mg one tablet p.o. q.d.  3. Synthroid 125 mcg one tablet p.o. q.d.   SPECIAL INSTRUCTIONS:  The patient was instructed to drink plenty of water.  She is to check fasting blood sugars and record in a log q.a.m.  The patient  is to have her TSH rechecked in four to six weeks.   FOLLOW UP:  On September 23, the patient is to report to the [facility] for a lab draw.  On September 24, the patient is to see Dr. Rob Bunting at  1:30 p.m.  On October 16, at 4 p.m., the patient is to see Dr. Jayme Cloud.     Barney Drain, M.D.                       Rachel Kim, M.D.    SS/MEDQ  D:  04/21/2002  T:  04/24/2002  Job:  [id number]   cc:   Billey Gosling, M.D.  [address] [place], [place] [address]  Fax: [phone]   Danice Goltz, M.D. [facility]  [address] [place], [place] [address]  Fax: 1

## 2010-12-17 NOTE — Discharge Summary (Signed)
[place]  Patient:    Rachel Kim, Rachel Kim [facility]                       MRN: [id number] Adm. Date:  [id number] Disc. Date: [id number] Attending:  Danella Penton                           Discharge Summary  ADMITTING DIAGNOSES:  1. Displaced disk, C5-6.  2. Left C6 radiculopathy  POSTOPERATIVE DIAGNOSES:  1. Displaced disk, C5-6.  2. Left C6 radiculopathy.  PROCEDURES: Anterior cervical diskectomy, C5-6, with simple allograft and anterior plating.  COMPLICATIONS: None.  DISCHARGE STATUS: Alive and well.  HISTORY OF PRESENT ILLNESS: Rachel Kim is a 55 year old woman who presented to Dr. Jeral Fruit with pain in the left upper extremity secondary to a herniated disk at C5-6 on the left side.  After conservative therapy failed it was recommended that she undergo an anterior cervical diskectomy and fusion.  HOSPITAL COURSE: Rachel Kim was therefore admitted to the hospital on November 05, 1999 and had an uncomplicated procedure.  Postoperatively she had 5/5 strength in the upper extremities.  The incision was clean and dry and without signs of infection.  Prior to discharge she was voiding, ambulating, and tolerating a regular diet without difficulty.  Pain was controlled with p.o. medications.  DISPOSITION: She will be discharged home.  DISCHARGE ACTIVITY: Instructions were given with regards to no bending, lifting, or twisting.  She is not to drive for two weeks.  FOLLOW-UP: She is to see Dr. Jeral Fruit in two to three weeks.  DISCHARGE MEDICATIONS: Given Vicodin for pain control. DD:  11/06/99 TD:  11/08/99 Job: [id number]

## 2010-12-17 NOTE — Op Note (Signed)
[facility]  Patient:    Rachel Kim, Rachel Kim [facility]                       MRN: [id number] Proc. Date: 11/05/99 Adm. Date:  [id number] Disc. 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  PROCEDURE:  Anterior C5 and C6 diskectomy, partial corpectomy of 5-6, decompression of the spinal cord, plus bilateral C6 nerve root, fibular graft of 11 mm height, Synthes plate from C5 to C6.  MICROSCOPE:  Midas Rex.  SURGEON:  Tanya Nones. Jeral Fruit, M.D.  ASSISTANT:  Alanson Aly. Roxan Hockey, M.D.  ANESTHESIA:  CLINICAL HISTORY:  The patient is a ______ 55 year old female complaining of neck and left upper extremity pain for a long time.  The patient has failed conservative treatment.  She declined further treatment including epidural injections.  MRI shows spondylosis at the level of 5-6, central and to the left, and incidental t the level of 3-4 and 4-5.  Surgery was advised.  The patient knew about the risks such as the need for further surgery, especially at the level of 4-5, collapse f the bone graft, CSF leak, worsening of pain, damage to the vocal cords, damage o the esophagus, infection, and need for further surgery.  DESCRIPTION OF PROCEDURE:  The patient was taken to the OR and she was positioned in a supine manner.  The left side of the neck was prepped.  Traction was applied to the head and also we put a roll between the shoulder.  Still, we had almost o neck whatsoever to do our procedure.  An incision was done.  We were unable to eel the tract here because of the size of the patient.  A transverse incision was done through the skin and platysma.   Retraction was done.  Indeed, we were able to get into the cervical spine, and we used two needles to find out where we were. One was at the level of 3-4, the other one at the level of 4-5.  From then on, we went down and we found 5-6.  Indeed, there was a calcification in the anterior ligament and it was difficult to get into the disk space.  We brought the microscope into the area and the only way we were able to get into the disk space was doing partial corpectomy of 5 and 6.  Then, with the help of the microscope, as well as the microcuret, we were able to do a diskectomy.  Our dissection was carried down until we found the posterior ligament which was opened.  Indeed, we found quite a bit of stenosis and decompression of both C6 nerve roots was achieved.  Also, decompression of the spinal cord was done.  This part of the procedure took almost 45 minutes because of the difficulty of getting into the area.  Nevertheless, at the end, we were unable to use our standard graft, and we had to do one of approximately 11 mm using fibular.  This was tailored to the patients size. The graft was inserted without any problem.  After that,  retraction was removed.  A  Synthes plate from C5 to C6 using four screws was done.  We attempted to do a lateral C-spine after the procedure, but we were unable to see only the top of 5. Because we knew the area corresponding to the area of surgery, as well as the calcification of the ligament, _________ was done to do x-rays.  We investigated the trachea, the esophagus, and the carotid, and both of them were clear. Hemostasis was done with bipolar.  The area was irrigated and closed with Vicryl and Steri-Strips. DD:  11/05/99 TD:  11/08/99 Job: [id number]

## 2011-01-14 ENCOUNTER — Encounter: Payer: Self-pay | Admitting: Gastroenterology

## 2011-01-31 ENCOUNTER — Encounter: Payer: Self-pay | Admitting: Gastroenterology

## 2011-01-31 ENCOUNTER — Ambulatory Visit (AMBULATORY_SURGERY_CENTER): Payer: BC Managed Care – PPO | Admitting: *Deleted

## 2011-01-31 VITALS — Ht 65.0 in | Wt 271.2 lb

## 2011-01-31 DIAGNOSIS — Z1211 Encounter for screening for malignant neoplasm of colon: Secondary | ICD-10-CM

## 2011-01-31 MED ORDER — PEG-KCL-NACL-NASULF-NA ASC-C 100 G PO SOLR: ORAL | Status: DC

## 2011-02-15 ENCOUNTER — Ambulatory Visit (AMBULATORY_SURGERY_CENTER): Payer: BC Managed Care – PPO | Admitting: Gastroenterology

## 2011-02-15 ENCOUNTER — Encounter: Payer: Self-pay | Admitting: Gastroenterology

## 2011-02-15 VITALS — BP 160/85 | HR 78 | Temp 98.5°F | Resp 20 | Ht 65.0 in | Wt 271.0 lb

## 2011-02-15 DIAGNOSIS — D126 Benign neoplasm of colon, unspecified: Secondary | ICD-10-CM

## 2011-02-15 DIAGNOSIS — Z1211 Encounter for screening for malignant neoplasm of colon: Secondary | ICD-10-CM

## 2011-02-15 DIAGNOSIS — K5730 Diverticulosis of large intestine without perforation or abscess without bleeding: Secondary | ICD-10-CM

## 2011-02-15 LAB — GLUCOSE, CAPILLARY

## 2011-02-15 MED ORDER — SODIUM CHLORIDE 0.9 % IV SOLN: 500.0000 mL | INTRAVENOUS | Status: DC

## 2011-02-15 NOTE — Progress Notes (Signed)
No complaints noted in the recovery room or on discharge. MAW 

## 2011-02-15 NOTE — Patient Instructions (Signed)
See the picture page for the findings of your colon exam.  Please follow the blue and green discharge instruction sheets the rest of the day.  Resume your prior medications today.  Call if any questions or concerns.

## 2011-02-16 ENCOUNTER — Telehealth: Payer: Self-pay

## 2011-02-16 NOTE — Telephone Encounter (Signed)
No id on answering machine  

## 2011-02-21 ENCOUNTER — Encounter: Payer: Self-pay | Admitting: Gastroenterology

## 2011-07-04 DIAGNOSIS — L280 Lichen simplex chronicus: Secondary | ICD-10-CM | POA: Insufficient documentation

## 2011-07-04 DIAGNOSIS — L810 Postinflammatory hyperpigmentation: Secondary | ICD-10-CM | POA: Insufficient documentation

## 2011-07-04 DIAGNOSIS — L281 Prurigo nodularis: Secondary | ICD-10-CM | POA: Insufficient documentation

## 2011-07-04 DIAGNOSIS — L709 Acne, unspecified: Secondary | ICD-10-CM | POA: Insufficient documentation

## 2011-07-04 DIAGNOSIS — Z872 Personal history of diseases of the skin and subcutaneous tissue: Secondary | ICD-10-CM | POA: Insufficient documentation

## 2011-07-21 ENCOUNTER — Ambulatory Visit (INDEPENDENT_AMBULATORY_CARE_PROVIDER_SITE_OTHER): Payer: BC Managed Care – PPO

## 2011-07-21 DIAGNOSIS — J018 Other acute sinusitis: Secondary | ICD-10-CM

## 2011-07-21 DIAGNOSIS — J209 Acute bronchitis, unspecified: Secondary | ICD-10-CM

## 2011-12-01 ENCOUNTER — Other Ambulatory Visit: Payer: Self-pay | Admitting: Chiropractic Medicine

## 2011-12-01 ENCOUNTER — Ambulatory Visit
Admission: RE | Admit: 2011-12-01 | Discharge: 2011-12-01 | Disposition: A | Discharge status: Home / Self Care | Payer: BC Managed Care – PPO | Source: Ambulatory Visit | Attending: Chiropractic Medicine | Admitting: Chiropractic Medicine

## 2011-12-01 DIAGNOSIS — M545 Low back pain, unspecified: Secondary | ICD-10-CM

## 2011-12-01 DIAGNOSIS — Z981 Arthrodesis status: Secondary | ICD-10-CM

## 2011-12-01 DIAGNOSIS — M5416 Radiculopathy, lumbar region: Secondary | ICD-10-CM

## 2011-12-02 ENCOUNTER — Other Ambulatory Visit: Payer: Self-pay | Admitting: Family Medicine

## 2011-12-02 MED ORDER — BENAZEPRIL HCL 20 MG PO TABS: 20.0000 mg | ORAL_TABLET | Freq: Two times a day (BID) | ORAL | Status: DC

## 2011-12-22 ENCOUNTER — Other Ambulatory Visit: Payer: Self-pay | Admitting: Chiropractic Medicine

## 2011-12-22 ENCOUNTER — Ambulatory Visit
Admission: RE | Admit: 2011-12-22 | Discharge: 2011-12-22 | Disposition: A | Discharge status: Home / Self Care | Payer: BC Managed Care – PPO | Source: Ambulatory Visit | Attending: Chiropractic Medicine | Admitting: Chiropractic Medicine

## 2011-12-22 DIAGNOSIS — M5412 Radiculopathy, cervical region: Secondary | ICD-10-CM

## 2011-12-22 DIAGNOSIS — M542 Cervicalgia: Secondary | ICD-10-CM

## 2011-12-22 DIAGNOSIS — R51 Headache: Secondary | ICD-10-CM

## 2012-01-25 ENCOUNTER — Other Ambulatory Visit: Payer: Self-pay | Admitting: Physician Assistant

## 2012-02-14 ENCOUNTER — Encounter: Payer: Self-pay | Admitting: Family Medicine

## 2012-03-22 ENCOUNTER — Ambulatory Visit (INDEPENDENT_AMBULATORY_CARE_PROVIDER_SITE_OTHER): Payer: BC Managed Care – PPO | Admitting: Family Medicine

## 2012-03-22 VITALS — BP 178/110 | HR 81 | Temp 98.2°F | Resp 18 | Ht 64.5 in | Wt 273.8 lb

## 2012-03-22 DIAGNOSIS — E119 Type 2 diabetes mellitus without complications: Secondary | ICD-10-CM

## 2012-03-22 DIAGNOSIS — I10 Essential (primary) hypertension: Secondary | ICD-10-CM

## 2012-03-22 MED ORDER — LABETALOL HCL 300 MG PO TABS: ORAL_TABLET | ORAL | Status: DC

## 2012-03-22 MED ORDER — BENAZEPRIL HCL 20 MG PO TABS: 20.0000 mg | ORAL_TABLET | Freq: Two times a day (BID) | ORAL | Status: DC

## 2012-03-22 NOTE — Patient Instructions (Addendum)
Monitor blood pressure carefully at home. Greater than 140/90 after 1-2 weeks you should return. Work hard on weight loss and regular exercise

## 2012-03-22 NOTE — Progress Notes (Signed)
Subjective: Patient is here for a recheck. She feels well. Her headaches have gone away since she started getting chiropractory. She is to come in for a lot of shots for pain but she has not had any. She sees a number of specialists. She sees an endocrinologist, a nephrologist, a dermatologist, a chiropractor, and several others. The only medication she gets drive from here in hours her blood pressure medication. She says her blood pressures usually been running well, though she's not been checking much lately. She understands that her weight is an issue.  Objective: Overweight white female in no acute distress this time. Soft no thyromegaly. No carotid bruits. Chest clear. Heart regular without murmurs. No edema he has a lot of dermatitis it looks like bites on her legs. 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  Assessment: Uncontrolled hypertension Diabetes Migraines controlled, Polypharmacy  Plan: Increase the labetalol to 1200 mg daily, taking 600 mg twice a day. Maximum allowable is 2400 mg a day, though 800 mg is more standard.  She is to monitor blood pressures and let us know for not coming down

## 2012-03-23 LAB — COMPREHENSIVE METABOLIC PANEL
ALT: 18 U/L (ref 0–35)
AST: 20 U/L (ref 0–37)
Albumin: 4.3 g/dL (ref 3.5–5.2)
Alkaline Phosphatase: 96 U/L (ref 39–117)
Glucose, Bld: 103 mg/dL — ABNORMAL HIGH (ref 70–99)
Potassium: 4.2 mEq/L (ref 3.5–5.3)
Sodium: 141 mEq/L (ref 135–145)
Total Bilirubin: 0.4 mg/dL (ref 0.3–1.2)
Total Protein: 7.2 g/dL (ref 6.0–8.3)

## 2012-03-25 ENCOUNTER — Encounter: Payer: Self-pay | Admitting: Family Medicine

## 2012-03-28 ENCOUNTER — Encounter: Payer: Self-pay | Admitting: Physician Assistant

## 2012-03-28 ENCOUNTER — Other Ambulatory Visit: Payer: Self-pay | Admitting: Family Medicine

## 2012-03-28 DIAGNOSIS — Z9289 Personal history of other medical treatment: Secondary | ICD-10-CM | POA: Insufficient documentation

## 2012-03-28 NOTE — Telephone Encounter (Signed)
Ok x 6 months 

## 2012-06-25 ENCOUNTER — Other Ambulatory Visit: Payer: Self-pay | Admitting: Physician Assistant

## 2012-09-19 ENCOUNTER — Other Ambulatory Visit: Payer: Self-pay | Admitting: Family Medicine

## 2012-10-09 ENCOUNTER — Ambulatory Visit (INDEPENDENT_AMBULATORY_CARE_PROVIDER_SITE_OTHER): Payer: BC Managed Care – PPO | Admitting: Family Medicine

## 2012-10-09 VITALS — BP 150/100 | HR 88 | Temp 98.0°F | Resp 16 | Ht 65.5 in | Wt 271.0 lb

## 2012-10-09 DIAGNOSIS — E039 Hypothyroidism, unspecified: Secondary | ICD-10-CM

## 2012-10-09 DIAGNOSIS — R0989 Other specified symptoms and signs involving the circulatory and respiratory systems: Secondary | ICD-10-CM

## 2012-10-09 DIAGNOSIS — R0609 Other forms of dyspnea: Secondary | ICD-10-CM

## 2012-10-09 DIAGNOSIS — E119 Type 2 diabetes mellitus without complications: Secondary | ICD-10-CM

## 2012-10-09 DIAGNOSIS — R5381 Other malaise: Secondary | ICD-10-CM

## 2012-10-09 DIAGNOSIS — R0600 Dyspnea, unspecified: Secondary | ICD-10-CM

## 2012-10-09 DIAGNOSIS — R5383 Other fatigue: Secondary | ICD-10-CM

## 2012-10-09 LAB — POCT CBC
MCH, POC: 28 pg (ref 27–31.2)
MCHC: 30.8 g/dL — AB (ref 31.8–35.4)
MID (cbc): 0.6 (ref 0–0.9)
MPV: 7.1 fL (ref 0–99.8)
POC MID %: 7.1 %M (ref 0–12)
Platelet Count, POC: 189 10*3/uL (ref 142–424)
RBC: 4.4 M/uL (ref 4.04–5.48)
WBC: 8.2 10*3/uL (ref 4.6–10.2)

## 2012-10-09 NOTE — Patient Instructions (Signed)
We will know the results of your labs  Start trying to overcome your deconditioning trying to get more regular exercise on an almost daily basis.

## 2012-10-09 NOTE — Progress Notes (Signed)
Subjective: 57 year old lady who is here with complaints of progressive fatigue over the past month or so. She had a week off in February and just did not get anything down because she just didn't have energy to do anything. Knows of no major specific change that brought this on. In fact she's had less stress since her boss changed at work a couple of months ago. She was told at a lab testing at the office that something was present with her white blood cells. She was told to get that rechecked. Dr. Lucianne Muss is her endocrinologist, and last fall changed her from Synthroid 137 to Armour Thyroid 90 mg daily except for one extra pill a week. She had her last A1c checked last summer and it was 5.9 at the end of the summer. She does not really check her sugars. She takes medicine for the diabetes, cholesterol, and blood pressure. Her blood pressure reportedly has been good elsewhere, but she had a high salt meal today. She does not get a lot of regular exercise. Her weight is is a very constant. No major headaches. No chest pains or palpitations. He is short of breath. The stomach is not bothering her particularly.  Objective: Pleasant alert overweight white female in no major acute distress today. However she was laying on the exam table when they came in the exam room. HEENT normal except for some wax in her years. Throat clear. Neck supple without significant nodes. No thyromegaly. Chest is clear to auscultation. Heart regular without murmurs gallops or arrhythmias. Abdomen soft without mass or tenderness. Ankle jerk is absent on the right but fairly normal on the left. Does not seem to have excessive delay.  Assessment: Unexplained fatigue Diabetes Hypertension Hyperlipidemia Hypothyroidism Shortness of breath Mild dizziness Obesity  Plan: Check basic labs and EKG on her. In a diabetic need to be certain there is not silent ischemia present. The change in her thyroid medications from synthetic thyroid  to desiccated thyroid could account for problems. He did make sure the status of her diabetes.  Results for orders placed in visit on 10/09/12  POCT CBC      Result Value Range   WBC 8.2  4.6 - 10.2 K/uL   Lymph, poc 2.0  0.6 - 3.4   POC LYMPH PERCENT 24.4  10 - 50 %L   MID (cbc) 0.6  0 - 0.9   POC MID % 7.1  0 - 12 %M   POC Granulocyte 5.6  2 - 6.9   Granulocyte percent 68.5  37 - 80 %G   RBC 4.40  4.04 - 5.48 M/uL   Hemoglobin 12.3  12.2 - 16.2 g/dL   HCT, POC 16.1  09.6 - 47.9 %   MCV 91.0  80 - 97 fL   MCH, POC 28.0  27 - 31.2 pg   MCHC 30.8 (*) 31.8 - 35.4 g/dL   RDW, POC 04.5     Platelet Count, POC 189  142 - 424 K/uL   MPV 7.1  0 - 99.8 fL  POCT GLYCOSYLATED HEMOGLOBIN (HGB A1C)      Result Value Range   Hemoglobin A1C 5.9    GLUCOSE, POCT (MANUAL RESULT ENTRY)      Result Value Range   POC Glucose 107 (*) 70 - 99 mg/dl   EKG right bundle branch block. I cannot find an old one to compare with. Stressed the need for regular exercise to overcome deconditioning. We will n results of the rest  of the labs. She can still follow up with her cardiologist and endocrinologist.

## 2012-10-10 LAB — TSH: TSH: 0.23 u[IU]/mL — ABNORMAL LOW (ref 0.350–4.500)

## 2012-10-10 LAB — COMPREHENSIVE METABOLIC PANEL
CO2: 30 mEq/L (ref 19–32)
Creat: 1.75 mg/dL — ABNORMAL HIGH (ref 0.50–1.10)
Glucose, Bld: 100 mg/dL — ABNORMAL HIGH (ref 70–99)
Total Bilirubin: 0.4 mg/dL (ref 0.3–1.2)

## 2012-10-11 ENCOUNTER — Encounter: Payer: Self-pay | Admitting: Family Medicine

## 2012-10-17 ENCOUNTER — Encounter: Payer: Self-pay | Admitting: Family Medicine

## 2012-10-17 ENCOUNTER — Encounter: Payer: Self-pay | Admitting: *Deleted

## 2012-11-03 ENCOUNTER — Other Ambulatory Visit: Payer: Self-pay | Admitting: Family Medicine

## 2012-11-06 ENCOUNTER — Other Ambulatory Visit: Payer: Self-pay | Admitting: Radiology

## 2012-11-06 NOTE — Telephone Encounter (Signed)
rc'd [facility] from [facility] for indapamide, please advise if okay to fill

## 2012-11-07 MED ORDER — INDAPAMIDE 1.25 MG PO TABS: 1.2500 mg | ORAL_TABLET | Freq: Every day | ORAL | Status: DC

## 2012-11-21 DIAGNOSIS — Z0289 Encounter for other administrative examinations: Secondary | ICD-10-CM

## 2012-11-27 ENCOUNTER — Other Ambulatory Visit: Payer: Self-pay | Admitting: Physician Assistant

## 2012-12-10 ENCOUNTER — Telehealth: Payer: Self-pay | Admitting: Neurology

## 2012-12-10 NOTE — Telephone Encounter (Signed)
Message copied by Melvyn Novas on Mon Dec 10, 2012  5:09 PM ------      Message from: [place], [place] T      Created: Mon Dec 10, 2012  3:08 PM      Regarding: FMLA Forms for Appointment       FYI:   Patient left FMLA forms in April 2014. She was advised to schedule an appointment. She has an appointment on 05/21. Her FMLA forms are in my space. I will complete them on 05/22 or 05/23. Su Ley, BS RN ------

## 2012-12-19 ENCOUNTER — Encounter: Payer: Self-pay | Admitting: Neurology

## 2012-12-19 ENCOUNTER — Ambulatory Visit (INDEPENDENT_AMBULATORY_CARE_PROVIDER_SITE_OTHER): Payer: BC Managed Care – PPO | Admitting: Neurology

## 2012-12-19 VITALS — BP 204/111 | HR 81 | Wt 278.0 lb

## 2012-12-19 DIAGNOSIS — I10 Essential (primary) hypertension: Secondary | ICD-10-CM

## 2012-12-19 DIAGNOSIS — G4710 Hypersomnia, unspecified: Secondary | ICD-10-CM | POA: Insufficient documentation

## 2012-12-19 DIAGNOSIS — G4733 Obstructive sleep apnea (adult) (pediatric): Secondary | ICD-10-CM | POA: Insufficient documentation

## 2012-12-19 NOTE — Patient Instructions (Addendum)
CPAP and BIPAP CPAP and BIPAP are methods of helping you breathe. CPAP stands for "continuous positive airway pressure." BIPAP stands for "bi-level positive airway pressure." Both CPAP and BIPAP are provided by a small machine with a flexible plastic tube that attaches to a plastic mask that goes over your nose or mouth. Air is blown into your air passages through your nose or mouth. This helps to keep your airways open and helps to keep you breathing well. The amount of pressure that is used to blow the air into your air passages can be set on the machine. The pressure setting is based on your needs. With CPAP, the amount of pressure stays the same while you breathe in and out. With BIPAP, the amount of pressure changes when you inhale and exhale. Your caregiver will recommend whether CPAP or BIPAP would be more helpful for you.  CPAP and BIPAP can be helpful for both adults and children with:  Sleep apnea.  Chronic Obstructive Pulmonary Disease (COPD), a condition like emphysema.  Diseases which weaken the muscles of the chest such as muscular dystrophy or neurological diseases.  Other problems that cause breathing to be weak or difficult. USE OF CPAP OR BIPAP The respiratory therapist or technician will help you get used to wearing the mask. Some people feel claustrophobic (a trapped or closed in feeling) at first, because the mask needs to be fairly snug on your face.   It may help you to get used to the mask gradually, by first holding the mask loosely over your nose or mouth using a low pressure setting on the machine. Gradually the mask can be applied more snugly with increased pressure. You can also gradually increase the amount of time the mask is used.  People with sleep apnea will use the mask and machine at night when they are sleeping. Others, like those with ALS or other breathing difficulties, may need the CPAP or BIPAP all the time.  If the first mask you try does not fit well, or  is uncomfortable, there are other types and sizes that can be tried.  If you tend to breathe through your mouth, a chin strap may be applied to help keep your mouth closed (if you are using a nasal mask).  The CPAP and BIPAP machines have alarms that may sound if the mask comes off or develops a leak.  You should not eat or drink while the CPAP or BIPAP is on. Food or fluids could get pushed into your lungs by the pressure of the CPAP or BIPAP. Sometimes CPAP or BIPAP machines are ordered for home use. If you are going to use the CPAP or BIPAP machine at home, follow these instructions  CPAP or BIPAP machines can be rented or purchased through home health care companies. There are many different brands of machines available. If you rent a machine before purchasing you may find which particular machine works well for you.  Ask questions if there is something you do not understand when picking out your machine.  Place your CPAP or BIPAP machine on a secure table or stand near an electrical outlet.  Know where the On/Off switch is.  Follow your doctor's instructions for how to set the pressure on your machine and when you should use it.  Do not smoke! Tobacco smoke residue can damage the machine. SEEK IMMEDIATE MEDICAL CARE IF:   You have redness or open areas around your nose or mouth.  You have trouble operating  the CPAP or BIPAP machine.  You cannot tolerate wearing the CPAP or BIPAP mask.  You have any questions or concerns. Document Released: 04/15/2004 Document Revised: 10/10/2011 Document Reviewed: 07/15/2008 [facility] Patient Information 2014 [place], [place]. Obesity Obesity is defined as having too much total body fat and a body mass index (BMI) of 30 or more. BMI is an estimate of body fat and is calculated from your height and weight. Obesity happens when you consume more calories than you can burn by exercising or performing daily physical tasks. Prolonged obesity can cause  major illnesses or emergencies, such as:   A stroke.  Heart disease.  Diabetes.  Cancer.  Arthritis.  High blood pressure (hypertension).  High cholesterol.  Sleep apnea.  Erectile dysfunction.  Infertility problems. CAUSES   Regularly eating unhealthy foods.  Physical inactivity.  Certain disorders, such as an underactive thyroid (hypothyroidism), Cushing's syndrome, and polycystic ovarian syndrome.  Certain medicines, such as steroids, some depression medicines, and antipsychotics.  Genetics.  Lack of sleep. DIAGNOSIS  A caregiver can diagnose obesity after calculating your BMI. Obesity will be diagnosed if your BMI is 30 or higher.  There are other methods of measuring obesity levels. Some other methods include measuring your skin fold thickness, your waist circumference, and comparing your hip circumference to your waist circumference. TREATMENT  A healthy treatment program includes some or all of the following:  Long-term dietary changes.  Exercise and physical activity.  Behavioral and lifestyle changes.  Medicine only under the supervision of your caregiver. Medicines may help, but only if they are used with diet and exercise programs. An unhealthy treatment program includes:  Fasting.  Fad diets.  Supplements and drugs. These choices do not succeed in long-term weight control.  HOME CARE INSTRUCTIONS   Exercise and perform physical activity as directed by your caregiver. To increase physical activity, try the following:  Use stairs instead of elevators.  Park farther away from store entrances.  Garden, bike, or walk instead of watching television or using the computer.  Eat healthy, low-calorie foods and drinks on a regular basis. Eat more fruits and vegetables. Use low-calorie cookbooks or take healthy cooking classes.  Limit fast food, sweets, and processed snack foods.  Eat smaller portions.  Keep a daily journal of everything you eat.  There are many free websites to help you with this. It may be helpful to measure your foods so you can determine if you are eating the correct portion sizes.  Avoid drinking alcohol. Drink more water and drinks without calories.  Take vitamins and supplements only as recommended by your caregiver.  Weight-loss support groups, Optometrist, counselors, and stress reduction education can also be very helpful. SEEK IMMEDIATE MEDICAL CARE IF:  You have chest pain or tightness.  You have trouble breathing or feel short of breath.  You have weakness or leg numbness.  You feel confused or have trouble talking.  You have sudden changes in your vision. MAKE SURE YOU:  Understand these instructions.  Will watch your condition.  Will get help right away if you are not doing well or get worse. Document Released: 08/25/2004 Document Revised: 01/17/2012 Document Reviewed: 08/24/2011 [facility] Patient Information 2014 [place], [place].

## 2012-12-19 NOTE — Assessment & Plan Note (Signed)
Uncontrolled  hypertension visit date 12/19/2012.

## 2012-12-19 NOTE — Progress Notes (Signed)
[facility]  Provider:  Dr Rachel Kim Referring Provider: [name], MD Primary Care Physician:  Rachel Kim., MD  Chief Complaint  Patient presents with  . Neurologic Problem    RM#11    HPI:  Rachel Kim is a 57 y.o. female here as a referral from Dr. Hal Kim for persistent hypersomnia. Patient had been seen in this practice in 2010 and 2011 when hypersomnia was to be first evaluated the patient tested positive N/A sleep study in 2011 for O. sleep apnea with an AHI of 56.4, and returned for CPAP titration to 12 cm water. At that time her BMI was 44 neck circumference 16.5, CPAP date the download from 2011 showed a residual AHI of 3.1 and 3.7 -a very good response in comparison to the baseline. This  30 days download 12/19/2012 documents a residual apnea hypopnea index of 3.8. She is 100% compliant over the last 30 days with an average use a time of 8 hours and 8 minutes nightly. She has regular  Sleep habits.    There are no large air leaks. In spite of being optimally treated for a to sleep apnea on CPAP, the patient continues to present with excessive daytime sleepiness.  Again today is sleepiness score is 16 points. In spite: .  In the past she used to the bathroom at night every 2 hours, but she has been treated now for nocturia and it is only one bathroom break at night left. Her endocrinologist increased upon her pleading with him her Synthroid dose 250 mcg daily. The patient is a known history of thyroid dysfunction  She is also a known diabetic and has changed from Glucophage to insulin with an injection of Lantus at night but she is using apparently a very stable doors and her sugars have been well controlled she is also on victor also.her lad st  HbA1c  Has been 5.9.  She has less daytime fatigue and sleepiness she reports when she doesn't take the nighttime dose of antihypertensives and statins.  Today her blood pressure was excessively high  systolically over 200 pounds this may not be a healthy step to her forgo  the night time dose. She is seeing Dr .Rachel Kim at [facility] cardio.  She has less headaches after chiropractic adjustment.  Her diet has been healthier , she went on a vegan diet - just over the last 8 month. BMI  remains morbidly obese.  Last November she also changed her job to a less stressful 1 which may have a big impact on blood pressure and blood sugar in the future.  She failed wellbutrin and nuvigil in the past.        Review of Systems: Out of a complete 14 system review, the patient complains of only the following symptoms, and all other reviewed systems are negative. High Epworth score on CPAP .   History   Social History  . Marital Status: Single    Spouse Name: N/A    Number of Children: N/A  . Years of Education: N/A   Occupational History  . Not on file.   Social History Main Topics  . Smoking status: Never Smoker   . Smokeless tobacco: Not on file  . Alcohol Use: No  . Drug Use: No  . Sexually Active: No   Other Topics Concern  . Not on file   Social History Narrative   09/14/10-he uses 12 cm water pressure and feels she has a good mask fit.  Initial download : Residual AHI was 3.4 - excellent at 8 hours nightly use. We have still to receive a new 6 month download from [place]. Refer to [facility] for download and pressure adjustment based on her desire to go on a flex or EPR setting. Epworth 10 points down from 20 last visit .   GERD is treated well, nocturia remains 2-4 nightly .   She did not tolerate Nuvigil when tried with another physician years ago.              Family History  Problem Relation Age of Onset  . Colitis Father   . Colon cancer Maternal Grandmother   . Hypertension Mother   . Stroke Mother     Past Medical History  Diagnosis Date  . Anxiety   . Depression   . Diabetes mellitus   . GERD (gastroesophageal reflux disease)   . Hyperlipidemia   .  Hypertension   . Thyroid disease     hypothyroidism  . Sleep apnea     uses CPAP  . Sarcoidosis of lung   . Chronic kidney disease (CKD), stage III (moderate)   . RBBB   . LVH (left ventricular hypertrophy)     echo 02/25/10 EF >55%  . High cholesterol   . Hyperthyroidism     diagnosed 1989  . H/O breast biopsy 02/10/10    calcification , no tumors  . OSA (obstructive sleep apnea)     severe complex apnea  . CSA (central sleep apnea)   . Pneumonia 04/04    Past Surgical History  Procedure Laterality Date  . Tonsillectomy    . Appendectomy    . Retroperitoneal lymph node excision      right underarm  . Elbow fracture surgery      with pins and pins removed  . Rhinoplasty      has had several revisions  . Tubinates trimmed    . Laminectomy l5, s1    . Carpal tunnel release      right and left  . Rt knee arthroscopy    . Cholecystectomy    . Laminectomy c5-c6    . Abdominal hysterectomy      Current Outpatient Prescriptions  Medication Sig Dispense Refill  . aspirin 81 MG tablet Take 81 mg by mouth daily.        . benazepril (LOTENSIN) 20 MG tablet TAKE 1 TABLET TWICE A DAY  180 tablet  0  . Cholecalciferol (VITAMIN D) 2000 UNITS CAPS Take by mouth daily.        . clonazePAM (KLONOPIN) 0.5 MG tablet Take 0.5 mg by mouth 2 (two) times daily as needed. Take 1/4 tablet as needed      . indapamide (LOZOL) 1.25 MG tablet Take 1 tablet (1.25 mg total) by mouth daily.  90 tablet  0  . labetalol (NORMODYNE) 300 MG tablet TAKE 2 TABLETS TWICE A DAY FOR BLOOD PRESSURE  360 tablet  0  . LANTUS SOLOSTAR 100 UNIT/ML injection 22 Units daily.       Marland Kitchen LEXAPRO 20 MG tablet Take 30 mg by mouth daily.       . mirabegron ER (MYRBETRIQ) 50 MG TB24 Take by mouth daily.      . Multiple Vitamin (MULTIVITAMIN PO) Take by mouth daily.        Marland Kitchen NEXIUM 40 MG capsule TAKE 1 CAPSULE DAILY  90 capsule  1  . niacin (NIASPAN) 1000 MG CR tablet Take 1,000 mg by  mouth at bedtime.      . Omega-3  Fatty Acids (FISH OIL) 1000 MG CAPS Take by mouth daily.      . Probiotic Product (PROBIOTIC FORMULA PO) Take by mouth daily.        Marland Kitchen SYNTHROID 137 MCG tablet Take 137 mcg by mouth daily.       Marland Kitchen VICTOZA 18 MG/3ML SOLN Inject 1.8 mg into the skin daily.       . B-D ULTRAFINE III SHORT PEN 31G X 8 MM MISC        Current Facility-Administered Medications  Medication Dose Route Frequency Provider Last Rate Last Dose  . 0.9 %  sodium chloride infusion  500 mL Intravenous Continuous Meryl Dare, MD        Allergies as of 12/19/2012 - Review Complete 12/19/2012  Allergen Reaction Noted  . Actos (pioglitazone hydrochloride) Swelling 01/31/2011  . Amlodipine Swelling 10/09/2012  . Glipizide Swelling 01/31/2011  . Clindamycin hcl Rash 01/31/2011    Vitals: BP 204/111  Pulse 81  Wt 278 lb (126.1 kg)  BMI 44.89 kg/m2 Last Weight:  Wt Readings from Last 1 Encounters:  12/19/12 278 lb (126.1 kg)   Last Height:   Ht Readings from Last 1 Encounters:  12/19/12 5\' 6"  (1.676 m)   Vision Screening:  Left eye with correction .  Right eye with correction vitals .  Physical exam:  General: The patient is awake, alert and appears not in acute distress. The patient is well groomed. Head: Normocephalic, atraumatic. Neck is supple. Mallampati 3 , neck circumference 18 Cardiovascular:  Regular rate and rhyth, without  murmurs or carotid bruit, and without distended neck veins. Respiratory: Lungs are clear to auscultation. Skin:  Without evidence of edema, or rash Trunk: BMI is highly  elevated and patient  has normal posture.  Neurologic exam : The patient is awake and alert, oriented to place and time.  Memory subjective  described as intact. There is a normal attention span & concentration ability. Speech is fluent without  dysarthria, dysphonia or aphasia. Mood and affect are appropriate. She has a history of chronic  Depression.   Cranial nerves: Pupils are equal and briskly reactive to  light. Funduscopic exam without  evidence of edema. Extraocular movements  in vertical and horizontal planes intact and without nystagmus. Visual fields by finger perimetry are intact. Hearing to finger rub intact.  Facial sensation intact to fine touch. Facial motor strength is symmetric and tongue and uvula move midline.  Motor exam:   Normal tone and normal muscle bulk and symmetric normal strength in all extremities.  Sensory:  Fine touch, pinprick and vibration were tested in all extremities. Proprioception is tested in the upper extremities only. This was  normal.  Coordination: Rapid alternating movements in the fingers/hands is tested and normal. Finger-to-nose maneuver tested and normal without evidence of ataxia, dysmetria or tremor.  Gait and station: Patient walks without assistive device and is able and assisted stool climb up to the exam table. Strength within normal limits. Stance is stable and normal.  Gait is wide based due to obesity.  Deep tendon reflexes: in the  upper and lower extremities are symmetric and intact. Babinskidowngoing.   Assessment:  After physical and neurologic examination, review of laboratory studies, imaging, neurophysiology testing and pre-existing records, assessment will be reviewed on the problem list.  Plan:  Treatment plan and additional workup will be reviewed under Problem List.  In spite of firm little benefits in her  daytime sleepiness she remains excessively sleepy. She is sleepy and in spite of using the OSA treatments and from off CPAP compliantly off being treated for diabetes hypertension and thyroid disease.  She as not tolerated stimulants in the past. Due to hypertension, I would be resistance to try another stimulant unless a cardiologist would approve. I will order an overnight pulse oximetry for the patient checking her oxygen levels at night by L. on her CPAP. Been on it there is a very little residual AH I left, but I cannot be sure  that the patient could still have significant oxygen desaturations or hypo-ventilation at night.

## 2012-12-26 DIAGNOSIS — L219 Seborrheic dermatitis, unspecified: Secondary | ICD-10-CM | POA: Insufficient documentation

## 2012-12-27 ENCOUNTER — Encounter: Payer: Self-pay | Admitting: Neurology

## 2013-01-03 ENCOUNTER — Ambulatory Visit (INDEPENDENT_AMBULATORY_CARE_PROVIDER_SITE_OTHER): Payer: BC Managed Care – PPO | Admitting: Cardiovascular Disease

## 2013-01-03 ENCOUNTER — Encounter: Payer: Self-pay | Admitting: Cardiovascular Disease

## 2013-01-03 VITALS — BP 160/90 | HR 78 | Ht 65.0 in | Wt 277.0 lb

## 2013-01-03 DIAGNOSIS — E1165 Type 2 diabetes mellitus with hyperglycemia: Secondary | ICD-10-CM

## 2013-01-03 DIAGNOSIS — E118 Type 2 diabetes mellitus with unspecified complications: Secondary | ICD-10-CM

## 2013-01-03 DIAGNOSIS — G4730 Sleep apnea, unspecified: Secondary | ICD-10-CM

## 2013-01-03 DIAGNOSIS — I10 Essential (primary) hypertension: Secondary | ICD-10-CM

## 2013-01-03 DIAGNOSIS — IMO0002 Reserved for concepts with insufficient information to code with codable children: Secondary | ICD-10-CM

## 2013-01-03 DIAGNOSIS — E7800 Pure hypercholesterolemia, unspecified: Secondary | ICD-10-CM

## 2013-01-03 DIAGNOSIS — G4733 Obstructive sleep apnea (adult) (pediatric): Secondary | ICD-10-CM

## 2013-01-03 MED ORDER — BENAZEPRIL HCL 20 MG PO TABS: 40.0000 mg | ORAL_TABLET | Freq: Every day | ORAL | Status: DC

## 2013-01-03 MED ORDER — METOPROLOL SUCCINATE ER 50 MG PO TB24: 50.0000 mg | ORAL_TABLET | Freq: Every day | ORAL | Status: DC

## 2013-01-03 MED ORDER — PRAVASTATIN SODIUM 40 MG PO TABS: 40.0000 mg | ORAL_TABLET | Freq: Every evening | ORAL | Status: DC

## 2013-01-03 NOTE — Patient Instructions (Addendum)
Your physician has recommended you make the following change in your medication: Stop niacin, stop labetalol, start metoprolol succinate 50 mg once daily in the morning, restart pravastatin 40 mg once daily in the evening, increase benazepril to 40 mg once daily in the morning. Your physician recommends that you schedule a follow-up appointment in: 3 months. Please bring your blood pressure monitor with you.

## 2013-01-03 NOTE — Progress Notes (Signed)
Patient ID: Rachel Kim, female   DOB: November 25, 1955, 57 y.o.   MRN: [id number] Only takes labetalol qd Change to toprol xl Increase benaz to 40 Not sleeping well so stopped all pm meds Stop niacin restart prava   Reason for office visit Hypertension and hyperlipidemia  In a pattern that has become characteristic, Rachel Kim has made further adjustments to her medications without consulting with [place]. She noticed that she couldn't rest well at night because her sleep was repeatedly disturbed. She decided to stop all of the medications that she takes in the evening. She slept better and no longer feels tired all day. The medications that she discontinue her pravastatin, niacin, evening dose of labetalol.  As at all her visits her blood pressure today is quite high and she blames on the fact that she has not taken her medications yet this morning. She never takes her medicines without first eating something.  Other than fatigue she denies any somatic complaints and specifically denies chest pain or shortness of breath.  Management of blood pressure has been difficult because of intolerance to numerous agents. Carvedilol cause intolerable fatigue. Amlodipine  and diltiazem cause severe swelling. States that she was advised not to take valsartan because of her kidney function but she does take an ACE inhibitor.  Allergies  Allergen Reactions  . Actos (Pioglitazone Hydrochloride) Swelling  . Amlodipine Swelling  . Glipizide Swelling  . Clindamycin Hcl Rash    Current Outpatient Prescriptions  Medication Sig Dispense Refill  . B-D ULTRAFINE III SHORT PEN 31G X 8 MM MISC       . benazepril (LOTENSIN) 20 MG tablet Take 2 tablets (40 mg total) by mouth daily.  90 tablet  3  . Cholecalciferol (VITAMIN D) 2000 UNITS CAPS Take by mouth daily.        . clonazePAM (KLONOPIN) 0.5 MG tablet Take 0.5 mg by mouth 2 (two) times daily as needed. Take 1/4 tablet as needed      . indapamide (LOZOL) 1.25 MG  tablet Take 1 tablet (1.25 mg total) by mouth daily.  90 tablet  0  . LANTUS SOLOSTAR 100 UNIT/ML injection 22 Units daily.       Marland Kitchen levothyroxine (SYNTHROID, LEVOTHROID) 150 MCG tablet Take 150 mcg by mouth daily.      Marland Kitchen LEXAPRO 20 MG tablet Take 30 mg by mouth daily.       . mirabegron ER (MYRBETRIQ) 50 MG TB24 Take by mouth daily.      Marland Kitchen NEXIUM 40 MG capsule TAKE 1 CAPSULE DAILY  90 capsule  1  . Probiotic Product (PROBIOTIC FORMULA PO) Take by mouth daily.        Marland Kitchen VICTOZA 18 MG/3ML SOLN Inject 1.8 mg into the skin daily.       Marland Kitchen aspirin 81 MG tablet Take 81 mg by mouth daily.        . fluocinonide (LIDEX) 0.05 % external solution       . hydrocortisone 2.5 % cream       . ketoconazole (NIZORAL) 2 % shampoo       . metoprolol succinate (TOPROL-XL) 50 MG 24 hr tablet Take 1 tablet (50 mg total) by mouth daily. Take with or immediately following a meal.  90 tablet  3  . Multiple Vitamin (MULTIVITAMIN PO) Take by mouth daily.        . Omega-3 Fatty Acids (FISH OIL) 1000 MG CAPS Take by mouth daily.      Marland Kitchen  ONETOUCH DELICA LANCETS 33G MISC       . ONETOUCH VERIO test strip       . pravastatin (PRAVACHOL) 40 MG tablet Take 1 tablet (40 mg total) by mouth every evening.  90 tablet  3   Current Facility-Administered Medications  Medication Dose Route Frequency Provider Last Rate Last Dose  . 0.9 %  sodium chloride infusion  500 mL Intravenous Continuous Meryl Dare, MD        Past Medical History  Diagnosis Date  . Anxiety   . Depression   . Diabetes mellitus   . GERD (gastroesophageal reflux disease)   . Hyperlipidemia   . Hypertension   . Thyroid disease     hypothyroidism  . Sleep apnea     uses CPAP  . Sarcoidosis of lung   . Chronic kidney disease (CKD), stage III (moderate)   . RBBB   . LVH (left ventricular hypertrophy)     echo 02/25/10 EF >55%  . High cholesterol   . Hyperthyroidism     diagnosed 1989  . H/O breast biopsy 02/10/10    calcification , no tumors  .  OSA (obstructive sleep apnea)     severe complex apnea  . CSA (central sleep apnea)   . Pneumonia 04/04    Past Surgical History  Procedure Laterality Date  . Tonsillectomy    . Appendectomy    . Retroperitoneal lymph node excision      right underarm  . Elbow fracture surgery      with pins and pins removed  . Rhinoplasty      has had several revisions  . Tubinates trimmed    . Laminectomy l5, s1    . Carpal tunnel release      right and left  . Rt knee arthroscopy    . Cholecystectomy    . Laminectomy c5-c6    . Abdominal hysterectomy      Family History  Problem Relation Age of Onset  . Colitis Father   . Colon cancer Maternal Grandmother   . Hypertension Mother   . Stroke Mother     History   Social History  . Marital Status: Single    Spouse Name: N/A    Number of Children: N/A  . Years of Education: N/A   Occupational History  . Not on file.   Social History Main Topics  . Smoking status: Never Smoker   . Smokeless tobacco: Not on file  . Alcohol Use: No  . Drug Use: No  . Sexually Active: No   Other Topics Concern  . Not on file   Social History Narrative   09/14/10-he uses 12 cm water pressure and feels she has a good mask fit.   Initial download : Residual AHI was 3.4 - excellent at 8 hours nightly use. We have still to receive a new 6 month download from [place]. Refer to [facility] for download and pressure adjustment based on her desire to go on a flex or EPR setting. Epworth 10 points down from 20 last visit .   GERD is treated well, nocturia remains 2-4 nightly .   She did not tolerate Nuvigil when tried with another physician years ago.              Review of systems: The patient specifically denies any chest pain at rest exertion, dyspnea at rest or with exertion, orthopnea, paroxysmal nocturnal dyspnea, syncope, palpitations, focal neurological deficits, intermittent claudication, lower extremity edema, unexplained  weight gain, cough,  hemoptysis or wheezing.   PHYSICAL EXAM BP 160/90  Pulse 78  Ht 5\' 5"  (1.651 m)  Wt 125.646 kg (277 lb)  BMI 46.1 kg/m2 Her blood pressure was similar when retested 15 minutes later General: Alert, oriented x3, no distress; morbidly obese Head: no evidence of trauma, PERRL, EOMI, no exophtalmos or lid lag, no myxedema, no xanthelasma; normal ears, nose and oropharynx Neck: normal jugular venous pulsations and no hepatojugular reflux; brisk carotid pulses without delay and no carotid bruits Chest: clear to auscultation, no signs of consolidation by percussion or palpation, normal fremitus, symmetrical and full respiratory excursions Cardiovascular: normal position and quality of the apical impulse, regular rhythm, normal first and second heart sounds, no murmurs, rubs or gallops Abdomen: no tenderness or distention, no masses by palpation, no abnormal pulsatility or arterial bruits, normal bowel sounds, no hepatosplenomegaly Extremities: no clubbing, cyanosis or edema; 2+ radial, ulnar and brachial pulses bilaterally; 2+ right femoral, posterior tibial and dorsalis pedis pulses; 2+ left femoral, posterior tibial and dorsalis pedis pulses; no subclavian or femoral bruits Neurological: grossly nonfocal   EKG: Sinus rhythm, chronic right bundle branch block, borderline left axis deviation, unchanged  Lipid Panel   01/29/2012  total cholesterol 161 triglycerides 193 HDL 39 LDL 83 but with elevated LDL particle number 1247 (desirable less than 1000) and small LDL particles of 789 (desirable <527)  BMET    Component Value Date/Time   NA 143 10/09/2012 1508   K 4.4 10/09/2012 1508   CL 105 10/09/2012 1508   CO2 30 10/09/2012 1508   GLUCOSE 100* 10/09/2012 1508   BUN 33* 10/09/2012 1508   CREATININE 1.75* 10/09/2012 1508   CALCIUM 9.0 10/09/2012 1508     ASSESSMENT AND PLAN No problem-specific assessment & plan notes found for this encounter.  Orders Placed This Encounter  Procedures  .  EKG 12-Lead   Meds ordered this encounter  Medications  . DISCONTD: benazepril (LOTENSIN) 20 MG tablet    Sig: Take 20 mg by mouth daily.  Marland Kitchen DISCONTD: labetalol (NORMODYNE) 300 MG tablet    Sig: Take 300 mg by mouth daily. 2 TABLETS  . levothyroxine (SYNTHROID, LEVOTHROID) 150 MCG tablet    Sig: Take 150 mcg by mouth daily.  . fluocinonide (LIDEX) 0.05 % external solution    Sig:   . ONETOUCH VERIO test strip    Sig:   . hydrocortisone 2.5 % cream    Sig:   . ketoconazole (NIZORAL) 2 % shampoo    Sig:   . ONETOUCH DELICA LANCETS 33G MISC    Sig:   . metoprolol succinate (TOPROL-XL) 50 MG 24 hr tablet    Sig: Take 1 tablet (50 mg total) by mouth daily. Take with or immediately following a meal.    Dispense:  90 tablet    Refill:  3  . pravastatin (PRAVACHOL) 40 MG tablet    Sig: Take 1 tablet (40 mg total) by mouth every evening.    Dispense:  90 tablet    Refill:  3  . benazepril (LOTENSIN) 20 MG tablet    Sig: Take 2 tablets (40 mg total) by mouth daily.    Dispense:  90 tablet    Refill:  3    Giavonni Fonder  Thurmon Fair, MD, [facility] [phone] office [phone] pager

## 2013-01-06 NOTE — Assessment & Plan Note (Addendum)
She has significant diabetic nephropathy with proteinuria and stage 3-4 chronic kidney disease. Strict glycemic control and strict blood pressure control are very important to prevent progression to end-stage renal disease other complication

## 2013-01-06 NOTE — Assessment & Plan Note (Signed)
She reports 100% compliant with CPAP

## 2013-01-06 NOTE — Assessment & Plan Note (Signed)
The patient reports 100% compliance with CPAP therapy

## 2013-01-06 NOTE — Assessment & Plan Note (Signed)
She continues to struggle with her weight and has really not made any progress

## 2013-01-06 NOTE — Assessment & Plan Note (Addendum)
Told her that I doubt that the pravastatin as the cause for her restless nights. I think it is more likely the niacin causing flushing in the middle of the night. She offered to resume taking the pravastatin in the morning but I told her that this wouldn't work. The pravastatin is only effective if taken in the evening. She agrees to do this. At least for the time being she'll stop taking niacin

## 2013-01-06 NOTE — Assessment & Plan Note (Addendum)
Very concerned that her blood pressure is poorly controlled. She states that she has a new blood pressure monitor and will monitor blood pressure more frequently. I told her that when one has diabetic nephropathy with abnormal renal function the risk of progression to renal failure and need for hemodialysis is very high. I told her that blood pressure control with a target in the 120s/70s range is critically important. As always she nods her head enthusiastically and states that she wants to achieve this. I told her that taking once a day labetalol to be ineffective due to the short duration of the activity of this drug. I have given her prescription for metoprolol succinate 50 mg to take once daily. She has also missed evening doses of benazepril, but I think this medication to be taken once daily. Given a new prescription to take 2 tablets ( 40 mg) every day in the morning.

## 2013-01-19 ENCOUNTER — Other Ambulatory Visit: Payer: Self-pay | Admitting: Physician Assistant

## 2013-01-19 NOTE — Telephone Encounter (Signed)
Looks like from last cards note that pt was changed from labetalol to metoprolol.  Please clarify this with patient

## 2013-01-21 NOTE — Telephone Encounter (Signed)
Pt Cb and verified that she no longer needs the labetalol and will also notify [facility].

## 2013-01-21 NOTE — Telephone Encounter (Signed)
LMOM to CB. 

## 2013-01-26 ENCOUNTER — Ambulatory Visit (INDEPENDENT_AMBULATORY_CARE_PROVIDER_SITE_OTHER): Payer: BC Managed Care – PPO | Admitting: Emergency Medicine

## 2013-01-26 VITALS — BP 158/84 | HR 81 | Temp 98.8°F | Resp 18 | Ht 64.8 in | Wt 278.0 lb

## 2013-01-26 DIAGNOSIS — R070 Pain in throat: Secondary | ICD-10-CM

## 2013-01-26 DIAGNOSIS — J209 Acute bronchitis, unspecified: Secondary | ICD-10-CM

## 2013-01-26 LAB — POCT RAPID STREP A (OFFICE): Rapid Strep A Screen: NEGATIVE

## 2013-01-26 MED ORDER — AZITHROMYCIN 250 MG PO TABS: ORAL_TABLET | ORAL | Status: DC

## 2013-01-26 MED ORDER — HYDROCODONE-HOMATROPINE 5-1.5 MG/5ML PO SYRP: 5.0000 mL | ORAL_SOLUTION | Freq: Three times a day (TID) | ORAL | Status: DC | PRN

## 2013-01-26 NOTE — Progress Notes (Signed)
  Subjective:    Patient ID: Rachel Kim, female    DOB: 1955/09/22, 57 y.o.   MRN: [id number]  HPI 57 year old woman presents to clinic this morning with cough, congestion, headache, sore throat, and fatigue. Symptoms started Thursday morning with some nausea. The nausea has subsided since then and Pt states appetite has been ok. Pt states she now has a dry, hacking cough. Pt is sneezing and did have yellow drainage. Pt states she has not had any fever, she's just been hot and sweaty. Pt has tried DayQuil and Ziacam.    Review of Systems     Objective:   Physical Exam HEENT exam is unremarkable TMs are normal she has wax in the right external auditory canal. Nose is congested. The throat is slightly red. Chest exam is clear to auscultation and percussion.  Results for orders placed in visit on 01/26/13  POCT RAPID STREP A (OFFICE)      Result Value Range   Rapid Strep A Screen Negative  Negative        Assessment & Plan:  Patient will be treated with a Z-Pak and hydrocodone cough syrup.

## 2013-01-26 NOTE — Patient Instructions (Addendum)

## 2013-01-30 ENCOUNTER — Other Ambulatory Visit: Payer: Self-pay | Admitting: Emergency Medicine

## 2013-01-30 NOTE — Telephone Encounter (Signed)
PT CALLED ALSO TO SAY SHE LOST SOME OF HER Z-PACK AND WOULD LIKE TO HAVE MORE CALLED IN FOR HER PLEASE CALL [phone] OR [phone]    [facility] [address] [facility] [place]

## 2013-01-31 ENCOUNTER — Telehealth: Payer: Self-pay

## 2013-01-31 MED ORDER — AZITHROMYCIN 250 MG PO TABS: ORAL_TABLET | ORAL | Status: DC

## 2013-01-31 NOTE — Telephone Encounter (Signed)
Sorry, she lost last 2 pills in the pack please advise.

## 2013-01-31 NOTE — Telephone Encounter (Signed)
[facility] Maralyn Sago) is calling with questions about patient losing some of her azithromycin please call sarah at [phone]

## 2013-01-31 NOTE — Telephone Encounter (Signed)
Please advise, patient states she has lost azithromycin/ wants more sent in.

## 2013-01-31 NOTE — Telephone Encounter (Signed)
Replacement pills sent to complete course.  Meds ordered this encounter  Medications  . azithromycin (ZITHROMAX) 250 MG tablet    Sig: Take 1 pill QD x 2 doses to complete course.    Dispense:  2 tablet    Refill:  0    Order Specific Question:  Supervising Provider    Answer:  DOOLITTLE, ROBERT P [[id number]]

## 2013-01-31 NOTE — Telephone Encounter (Signed)
Please clarify: did she lose some pills?  How many? Or, did she lose the printed prescription?

## 2013-02-01 NOTE — Telephone Encounter (Signed)
Lm for pt that script is waiting for her at her pharmacy.

## 2013-02-20 ENCOUNTER — Telehealth: Payer: Self-pay | Admitting: Neurology

## 2013-02-20 NOTE — Telephone Encounter (Signed)
Pt called concerning her FMLA papers, pt states she has been speaking with Vikki Ports pertaining to her paperwork. Pt would like for Vikki Ports to call her back concerning this matter. Thanks

## 2013-02-20 NOTE — Telephone Encounter (Signed)
Spoke to patient. Advised will forward message to RN-Valerie. Agreed.

## 2013-02-22 ENCOUNTER — Telehealth: Payer: Self-pay

## 2013-03-01 ENCOUNTER — Telehealth: Payer: Self-pay

## 2013-03-01 DIAGNOSIS — E1165 Type 2 diabetes mellitus with hyperglycemia: Secondary | ICD-10-CM

## 2013-03-01 DIAGNOSIS — IMO0002 Reserved for concepts with insufficient information to code with codable children: Secondary | ICD-10-CM

## 2013-03-01 NOTE — Telephone Encounter (Signed)
Letter requests referral to [facility], Glorianne Manchester, MD that will be more convenient to her work. OKd referral w/Dr Cleta Alberts and I put in the referral order.

## 2013-03-01 NOTE — Telephone Encounter (Addendum)
Patient has a request for a referral to a new endocrinologist. Patient brought in a letter stating specifically what she would like. Left letter with Britta Mccreedy.   [phone]

## 2013-03-06 ENCOUNTER — Telehealth: Payer: Self-pay | Admitting: Neurology

## 2013-03-06 NOTE — Telephone Encounter (Signed)
Called patient, forms completed and picked up by patient.

## 2013-03-31 ENCOUNTER — Other Ambulatory Visit: Payer: Self-pay | Admitting: Physician Assistant

## 2013-04-04 ENCOUNTER — Telehealth: Payer: Self-pay | Admitting: Endocrinology

## 2013-04-04 NOTE — Telephone Encounter (Signed)
Pt is no longer seeing Dr. Lucianne Muss

## 2013-04-09 ENCOUNTER — Telehealth: Payer: Self-pay | Admitting: Endocrinology

## 2013-04-09 MED ORDER — INSULIN PEN NEEDLE 31G X 8 MM MISC: 1.0000 | Freq: Two times a day (BID) | Status: DC

## 2013-04-09 NOTE — Telephone Encounter (Signed)
See 04/04/13 message. Pt called again for needles. She says she is running low. Please call the patient - CB# [phone] / Sherri S.

## 2013-04-11 ENCOUNTER — Other Ambulatory Visit: Payer: Self-pay | Admitting: Family Medicine

## 2013-04-12 ENCOUNTER — Encounter: Payer: Self-pay | Admitting: Cardiovascular Disease

## 2013-04-12 ENCOUNTER — Ambulatory Visit (INDEPENDENT_AMBULATORY_CARE_PROVIDER_SITE_OTHER): Payer: BC Managed Care – PPO | Admitting: Cardiovascular Disease

## 2013-04-12 VITALS — BP 174/96 | HR 76 | Resp 20 | Ht 65.0 in | Wt 286.8 lb

## 2013-04-12 DIAGNOSIS — I10 Essential (primary) hypertension: Secondary | ICD-10-CM

## 2013-04-12 DIAGNOSIS — E1165 Type 2 diabetes mellitus with hyperglycemia: Secondary | ICD-10-CM

## 2013-04-12 DIAGNOSIS — IMO0002 Reserved for concepts with insufficient information to code with codable children: Secondary | ICD-10-CM

## 2013-04-12 DIAGNOSIS — E782 Mixed hyperlipidemia: Secondary | ICD-10-CM

## 2013-04-12 DIAGNOSIS — G4733 Obstructive sleep apnea (adult) (pediatric): Secondary | ICD-10-CM

## 2013-04-12 MED ORDER — NEBIVOLOL HCL 20 MG PO TABS: 20.0000 mg | ORAL_TABLET | Freq: Every day | ORAL | Status: DC

## 2013-04-12 NOTE — Patient Instructions (Addendum)
Stop Metoprolol.  Start Bystolic 20mg  daily.  Your physician recommends that you schedule a follow-up appointment in: 3-4 weeks with a PA or NP.

## 2013-04-14 ENCOUNTER — Encounter: Payer: Self-pay | Admitting: Cardiovascular Disease

## 2013-04-14 NOTE — Progress Notes (Signed)
Patient ID: Rachel Kim, female   DOB: [birth year], 57 y.o.   MRN: [id number]     Reason for office visit Hypertension, hyperlipidemia, obesity, sleep apnea  Rachel Kim is morbidly obese and has severe systemic hypertension, diabetes mellitus that is well controlled, chronic kidney disease stage 2-3 and treated obstructive sleep apnea.  We have had difficulty with blood pressure control since she has had side effects with several medications. She has also shown occasional problems with medication noncompliance. She is using a blood pressure cuff for home monitoring but the cuff is too small to fit her upper arm and seems to give unreliable readings when used on her forearm. It clearly overestimates her blood pressure when used in the clinic.  She reports a lot of stress at work and the fact that this forces her to eat junk food. She is really not doing a great job with sodium restriction. On the other hand her glycemic control seems to be excellent.  She denies overt problems with shortness of breath but is very inactive. She has chronic lower showed edema.    Allergies  Allergen Reactions  . Actos [Pioglitazone Hydrochloride] Swelling  . Amlodipine Swelling  . Glipizide Swelling  . Clindamycin Hcl Rash    Current Outpatient Prescriptions  Medication Sig Dispense Refill  . benazepril (LOTENSIN) 20 MG tablet Take 2 tablets (40 mg total) by mouth daily.  90 tablet  3  . Cholecalciferol (VITAMIN D) 2000 UNITS CAPS Take by mouth daily.        . clonazePAM (KLONOPIN) 0.5 MG tablet Take 0.5 mg by mouth 2 (two) times daily as needed. Take 1/4 tablet as needed      . esomeprazole (NEXIUM) 40 MG capsule Take 1 capsule (40 mg total) by mouth daily before breakfast. PATIENT NEEDS OFFICE VISIT FOR ADDITIONAL REFILLS  90 capsule  0  . HYDROcodone-homatropine (HYCODAN) 5-1.5 MG/5ML syrup Take 5 mLs by mouth every 8 (eight) hours as needed for cough.  240 mL  0  . indapamide (LOZOL) 1.25 MG tablet  Take 1 tablet (1.25 mg total) by mouth daily. PATIENT NEEDS OFFICE VISIT FOR ADDITIONAL REFILLS  30 tablet  0  . Insulin Pen Needle (B-D ULTRAFINE III SHORT PEN) 31G X 8 MM MISC Inject 1 each into the skin 2 (two) times daily.  30 each  0  . Insulin Pen Needle (B-D ULTRAFINE III SHORT PEN) 31G X 8 MM MISC Inject 1 each into the skin 2 (two) times daily.  200 each  1  . ketoconazole (NIZORAL) 2 % shampoo       . LANTUS SOLOSTAR 100 UNIT/ML injection 22 Units daily.       Marland Kitchen levothyroxine (SYNTHROID, LEVOTHROID) 150 MCG tablet Take 150 mcg by mouth daily.      Marland Kitchen LEXAPRO 20 MG tablet Take 30 mg by mouth daily.       . mirabegron ER (MYRBETRIQ) 50 MG TB24 Take by mouth daily.      Rachel Kim DELICA LANCETS 33G MISC       . ONETOUCH VERIO test strip       . pravastatin (PRAVACHOL) 40 MG tablet Take 1 tablet (40 mg total) by mouth every evening.  90 tablet  3  . Probiotic Product (PROBIOTIC FORMULA PO) Take by mouth daily.        Marland Kitchen VICTOZA 18 MG/3ML SOLN Inject 1.8 mg into the skin daily.       Marland Kitchen aspirin 81 MG tablet Take 81  mg by mouth daily.        . Nebivolol HCl 20 MG TABS Take 1 tablet (20 mg total) by mouth daily.  90 tablet  3   Current Facility-Administered Medications  Medication Dose Route Frequency Provider Last Rate Last Dose  . 0.9 %  sodium chloride infusion  500 mL Intravenous Continuous Meryl Dare, MD        Past Medical History  Diagnosis Date  . Anxiety   . Depression   . Diabetes mellitus   . GERD (gastroesophageal reflux disease)   . Hyperlipidemia   . Hypertension   . Thyroid disease     hypothyroidism  . Sleep apnea     uses CPAP  . Sarcoidosis of lung   . Chronic kidney disease (CKD), stage III (moderate)   . RBBB   . LVH (left ventricular hypertrophy)     echo 02/25/10 EF >55%  . High cholesterol   . Hyperthyroidism     diagnosed 1989  . H/O breast biopsy 02/10/10    calcification , no tumors  . OSA (obstructive sleep apnea)     severe complex apnea    . CSA (central sleep apnea)   . Pneumonia 04/04    Past Surgical History  Procedure Laterality Date  . Tonsillectomy    . Appendectomy    . Retroperitoneal lymph node excision      right underarm  . Elbow fracture surgery      with pins and pins removed  . Rhinoplasty      has had several revisions  . Tubinates trimmed    . Laminectomy l5, s1    . Carpal tunnel release      right and left  . Rt knee arthroscopy    . Cholecystectomy    . Laminectomy c5-c6    . Abdominal hysterectomy      Family History  Problem Relation Age of Onset  . Colitis Father   . Colon cancer Maternal Grandmother   . Hypertension Mother   . Stroke Mother     History   Social History  . Marital Status: Single    Spouse Name: N/A    Number of Children: N/A  . Years of Education: N/A   Occupational History  . Not on file.   Social History Main Topics  . Smoking status: Never Smoker   . Smokeless tobacco: Never Used  . Alcohol Use: No  . Drug Use: No  . Sexual Activity: No   Other Topics Concern  . Not on file   Social History Narrative   09/14/10-he uses 12 cm water pressure and feels she has a good mask fit.   Initial download : Residual AHI was 3.4 - excellent at 8 hours nightly use. We have still to receive a new 6 month download from Rachel Kim. Refer to [facility] for download and pressure adjustment based on her desire to go on a flex or EPR setting. Epworth 10 points down from 20 last visit .   GERD is treated well, nocturia remains 2-4 nightly .   She did not tolerate Nuvigil when tried with another physician years ago.              Review of systems: The patient specifically denies any chest pain at rest exertion, dyspnea at rest or with exertion, orthopnea, paroxysmal nocturnal dyspnea, syncope, palpitations, focal neurological deficits, intermittent claudication,unexplained weight gain, cough, hemoptysis or wheezing.   PHYSICAL EXAM BP 174/96  Pulse 76  Resp 20  Ht 5\' 5"   (1.651 m)  Wt 286 lb 12.8 oz (130.092 kg)  BMI 47.73 kg/m2 General: Alert, oriented x3, no distress; morbidly obese  Head: no evidence of trauma, PERRL, EOMI, no exophtalmos or lid lag, no myxedema, no xanthelasma; normal ears, nose and oropharynx  Neck: normal jugular venous pulsations and no hepatojugular reflux; brisk carotid pulses without delay and no carotid bruits  Chest: clear to auscultation, no signs of consolidation by percussion or palpation, normal fremitus, symmetrical and full respiratory excursions  Cardiovascular: normal position and quality of the apical impulse, regular rhythm, normal first and second heart sounds, no murmurs, rubs or gallops  Abdomen: no tenderness or distention, no masses by palpation, no abnormal pulsatility or arterial bruits, normal bowel sounds, no hepatosplenomegaly  Extremities: no clubbing, cyanosis; 2+ symmetrical nonpitting ankle edema; 2+ radial, ulnar and brachial pulses bilaterally; 2+ right femoral, posterior tibial and dorsalis pedis pulses; 2+ left femoral, posterior tibial and dorsalis pedis pulses; no subclavian or femoral bruits  Neurological: grossly nonfocal   Lipid Panel  No results found for this basename: chol, trig, hdl, cholhdl, vldl, ldlcalc    BMET    Component Value Date/Time   NA 143 10/09/2012 1508   K 4.4 10/09/2012 1508   CL 105 10/09/2012 1508   CO2 30 10/09/2012 1508   GLUCOSE 100* 10/09/2012 1508   BUN 33* 10/09/2012 1508   CREATININE 1.75* 10/09/2012 1508   CALCIUM 9.0 10/09/2012 1508     ASSESSMENT AND PLAN Controlled diabetes mellitus type 2 with complications She has diabetic nephropathy and sta worsening obesity puts her in a vicious cycle of insulin resistance. ge 3-4 chronic kidney disease with proteinuria. She is at high risk of progression to hemodialysis. Her blood pressure control is poor which is a high-risk feature.  Morbid obesity    OSA on CPAP He states that she is compliant with  CPAP  HYPERTENSION, BENIGN SYSTEMIC She has severe systemic hypertension that persists despite the fact that she is taking a full dose of ACE inhibitor, beta blockers, thiazide diuretics. She was poorly tolerant of calcium channel blockers due to severe worsening of ankle edema. She has expressed side effects with numerous other medications as well. Attempts at treatment with carvedilol or high doses of metoprolol or associate with intolerable fatigue that prevented her from working. We'll try to see if she tolerates bystolic better: samples and a prescription for Bystolic 20 mg daily were given. Reinforced important role of sodium restriction, daily exercise and weight loss.  Mixed hyperlipidemia She did not have a lipid profile that I ordered to be done just before this visit    Meds ordered this encounter  Medications  . Nebivolol HCl 20 MG TABS    Sig: Take 1 tablet (20 mg total) by mouth daily.    Dispense:  90 tablet    Refill:  3    Eon Zunker  Thurmon Fair, MD, [facility] [phone] office [phone] pager

## 2013-04-14 NOTE — Assessment & Plan Note (Addendum)
She has severe systemic hypertension that persists despite the fact that she is taking a full dose of ACE inhibitor, beta blockers, thiazide diuretics. She was poorly tolerant of calcium channel blockers due to severe worsening of ankle edema. She has expressed side effects with numerous other medications as well. Attempts at treatment with carvedilol or high doses of metoprolol or associate with intolerable fatigue that prevented her from working. We'll try to see if she tolerates bystolic better: samples and a prescription for Bystolic 20 mg daily were given. Reinforced important role of sodium restriction, daily exercise and weight loss.

## 2013-04-14 NOTE — Assessment & Plan Note (Signed)
He states that she is compliant with CPAP

## 2013-04-14 NOTE — Assessment & Plan Note (Signed)
She did not have a lipid profile that I ordered to be done just before this visit

## 2013-04-14 NOTE — Assessment & Plan Note (Signed)
She has diabetic nephropathy and sta worsening obesity puts her in a vicious cycle of insulin resistance. ge 3-4 chronic kidney disease with proteinuria. She is at high risk of progression to hemodialysis. Her blood pressure control is poor which is a high-risk feature.

## 2013-04-15 ENCOUNTER — Telehealth: Payer: Self-pay | Admitting: Cardiovascular Disease

## 2013-04-15 ENCOUNTER — Telehealth: Payer: Self-pay | Admitting: *Deleted

## 2013-04-15 DIAGNOSIS — E782 Mixed hyperlipidemia: Secondary | ICD-10-CM

## 2013-04-15 DIAGNOSIS — Z79899 Other long term (current) drug therapy: Secondary | ICD-10-CM

## 2013-04-15 NOTE — Telephone Encounter (Signed)
Message copied by Vita Barley on Mon Apr 15, 2013  1:08 PM ------      Message from: [place], [place]      Created: Sun Apr 14, 2013  8:41 AM       She was supposed to have a lipid profile before this visit. Need to order if she didn't do it. ------

## 2013-04-15 NOTE — Telephone Encounter (Signed)
Lipid Profile and CMP ordered and mailed to patient.  LM telling pt to expect the order in the mail and have done FASTING at her convenience.

## 2013-04-15 NOTE — Telephone Encounter (Signed)
Pt was returning your call.

## 2013-04-16 NOTE — Telephone Encounter (Signed)
Will have Lipid Profile and CMET done when she receives the order.

## 2013-04-23 ENCOUNTER — Ambulatory Visit (INDEPENDENT_AMBULATORY_CARE_PROVIDER_SITE_OTHER): Payer: BC Managed Care – PPO | Admitting: Family Medicine

## 2013-04-23 VITALS — BP 140/100 | HR 84 | Temp 99.3°F | Resp 18 | Ht 64.5 in | Wt 284.0 lb

## 2013-04-23 DIAGNOSIS — R05 Cough: Secondary | ICD-10-CM

## 2013-04-23 DIAGNOSIS — R509 Fever, unspecified: Secondary | ICD-10-CM

## 2013-04-23 DIAGNOSIS — R059 Cough, unspecified: Secondary | ICD-10-CM

## 2013-04-23 DIAGNOSIS — J209 Acute bronchitis, unspecified: Secondary | ICD-10-CM

## 2013-04-23 LAB — POCT INFLUENZA A/B
Influenza A, POC: NEGATIVE
Influenza B, POC: NEGATIVE

## 2013-04-23 MED ORDER — AZITHROMYCIN 250 MG PO TABS: ORAL_TABLET | ORAL | Status: DC

## 2013-04-23 MED ORDER — HYDROCODONE-HOMATROPINE 5-1.5 MG/5ML PO SYRP: 5.0000 mL | ORAL_SOLUTION | Freq: Three times a day (TID) | ORAL | Status: DC | PRN

## 2013-04-23 NOTE — Patient Instructions (Addendum)

## 2013-04-23 NOTE — Progress Notes (Signed)
[facility]:  Office Visit  Chief Complaint:  Chief Complaint  Patient presents with  . Generalized Body Aches    fever symptoms since Sunday night  . Sore Throat  . Cough    HPI: Rachel Kim is a 57 y.o. female who complains of Sunday night, x 3 days, body aches, T max 100.4 last night, sore throat and cough, lots of congestion, has had HA behind eyes and on top of her head. Has missed work x 2 days.  She has tried Mucinex DM , Aleve. Usually Z pack and hydrocone kicks it out in 24 hrs. Last HbA1c 6.2 Nonsmoker, no allergies Dx with sarcoidosis in 2003, no meds for this, she only gets bronchitis once a year.  She has had some wheezing and SOB with heavy exertion  Past Medical History  Diagnosis Date  . Anxiety   . Depression   . Diabetes mellitus   . GERD (gastroesophageal reflux disease)   . Hyperlipidemia   . Hypertension   . Thyroid disease     hypothyroidism  . Sleep apnea     uses CPAP  . Sarcoidosis of lung   . Chronic kidney disease (CKD), stage III (moderate)   . RBBB   . LVH (left ventricular hypertrophy)     echo 02/25/10 EF >55%  . High cholesterol   . Hyperthyroidism     diagnosed 1989  . H/O breast biopsy 02/10/10    calcification , no tumors  . OSA (obstructive sleep apnea)     severe complex apnea  . CSA (central sleep apnea)   . Pneumonia 04/04   Past Surgical History  Procedure Laterality Date  . Tonsillectomy    . Appendectomy    . Retroperitoneal lymph node excision      right underarm  . Elbow fracture surgery      with pins and pins removed  . Rhinoplasty      has had several revisions  . Tubinates trimmed    . Laminectomy l5, s1    . Carpal tunnel release      right and left  . Rt knee arthroscopy    . Cholecystectomy    . Laminectomy c5-c6    . Abdominal hysterectomy     History   Social History  . Marital Status: Single    Spouse Name: N/A    Number of Children: N/A  . Years of Education: N/A    Social History Main Topics  . Smoking status: Never Smoker   . Smokeless tobacco: Never Used  . Alcohol Use: No  . Drug Use: No  . Sexual Activity: No   Other Topics Concern  . Not on file   Social History Narrative   09/14/10-he uses 12 cm water pressure and feels she has a good mask fit.   Initial download : Residual AHI was 3.4 - excellent at 8 hours nightly use. We have still to receive a new 6 month download from [facility]. Refer to [facility] for download and pressure adjustment based on her desire to go on a flex or EPR setting. Epworth 10 points down from 20 last visit .   GERD is treated well, nocturia remains 2-4 nightly .   She did not tolerate Nuvigil when tried with another physician years ago.             Family History  Problem Relation Age of Onset  . Colitis Father   . Colon cancer Maternal Grandmother   .  Hypertension Mother   . Stroke Mother    Allergies  Allergen Reactions  . Actos [Pioglitazone Hydrochloride] Swelling  . Amlodipine Swelling  . Glipizide Swelling  . Clindamycin Hcl Rash   Prior to Admission medications   Medication Sig Start Date End Date Taking? Authorizing Provider  benazepril (LOTENSIN) 20 MG tablet Take 2 tablets (40 mg total) by mouth daily. 01/03/13  Yes Mihai Croitoru, MD  Cholecalciferol (VITAMIN D) 2000 UNITS CAPS Take by mouth daily.     Yes Historical Provider, MD  clonazePAM (KLONOPIN) 0.5 MG tablet Take 0.5 mg by mouth 2 (two) times daily as needed. Take 1/4 tablet as needed   Yes Historical Provider, MD  esomeprazole (NEXIUM) 40 MG capsule Take 1 capsule (40 mg total) by mouth daily before breakfast. PATIENT NEEDS OFFICE VISIT FOR ADDITIONAL REFILLS 04/11/13  Yes Heather M Marte, PA-C  indapamide (LOZOL) 1.25 MG tablet Take 1 tablet (1.25 mg total) by mouth daily. PATIENT NEEDS OFFICE VISIT FOR ADDITIONAL REFILLS 03/31/13  Yes Eleanore Delia Chimes, PA-C  Insulin Pen Needle (B-D ULTRAFINE III SHORT PEN) 31G X 8 MM MISC Inject 1 each into the  skin 2 (two) times daily. 04/09/13  Yes Reather Littler, MD  Insulin Pen Needle (B-D ULTRAFINE III SHORT PEN) 31G X 8 MM MISC Inject 1 each into the skin 2 (two) times daily. 04/09/13  Yes Reather Littler, MD  ketoconazole (NIZORAL) 2 % shampoo  12/26/12  Yes Historical Provider, MD  LANTUS SOLOSTAR 100 UNIT/ML injection 22 Units daily.  01/26/11  Yes Historical Provider, MD  levothyroxine (SYNTHROID, LEVOTHROID) 150 MCG tablet Take 150 mcg by mouth daily.   Yes Historical Provider, MD  LEXAPRO 20 MG tablet Take 30 mg by mouth daily.  12/17/10  Yes Historical Provider, MD  mirabegron ER (MYRBETRIQ) 50 MG TB24 Take by mouth daily.   Yes Historical Provider, MD  Nebivolol HCl 20 MG TABS Take 1 tablet (20 mg total) by mouth daily. 04/12/13  Yes Thurmon Fair, MD  ONETOUCH DELICA LANCETS 33G MISC  01/02/13  Yes Historical Provider, MD  Letta Pate VERIO test strip  01/02/13  Yes Historical Provider, MD  pravastatin (PRAVACHOL) 40 MG tablet Take 1 tablet (40 mg total) by mouth every evening. 01/03/13  Yes Mihai Croitoru, MD  VICTOZA 18 MG/3ML SOLN Inject 1.8 mg into the skin daily.  01/26/11  Yes Historical Provider, MD  aspirin 81 MG tablet Take 81 mg by mouth daily.      Historical Provider, MD  HYDROcodone-homatropine (HYCODAN) 5-1.5 MG/5ML syrup Take 5 mLs by mouth every 8 (eight) hours as needed for cough. 01/26/13   Collene Gobble, MD  Probiotic Product (PROBIOTIC FORMULA PO) Take by mouth daily.      Historical Provider, MD     ROS: The patient denies night sweats, unintentional weight loss, chest pain, palpitations,  nausea, vomiting, abdominal pain, dysuria, hematuria, melena, numbness, weakness, or tingling.  All other systems have been reviewed and were otherwise negative with the exception of those mentioned in the HPI and as above.    PHYSICAL EXAM: Filed Vitals:   04/23/13 0819  BP: 140/100  Pulse: 84  Temp: 99.3 F (37.4 C)  Resp: 18   Filed Vitals:   04/23/13 0819  Height: 5' 4.5" (1.638 m)  Weight:  284 lb (128.822 kg)   Body mass index is 48.01 kg/(m^2).  General: Alert, no acute distress, obese white female HEENT:  Normocephalic, atraumatic, oropharynx patent. EOMI, PERRLA. NO exudates, TM nl, No  sinus tenderenss, boggy nares, erythematous throat, no tonsils Cardiovascular:  Regular rate and rhythm, no rubs murmurs or gallops.  No Carotid bruits, radial pulse intact. No pedal edema.  Respiratory: Clear to auscultation bilaterally.  No wheezes, rales, or rhonchi.  No cyanosis, no use of accessory musculature GI: No organomegaly, abdomen is soft and non-tender, positive bowel sounds.  No masses. Skin: No rashes. Neurologic: Facial musculature symmetric. Psychiatric: Patient is appropriate throughout our interaction. Lymphatic: No cervical lymphadenopathy Musculoskeletal: Gait intact.   LABS: Results for orders placed in visit on 04/23/13  POCT INFLUENZA A/B      Result Value Range   Influenza A, POC Negative     Influenza B, POC Negative       EKG/XRAY:   Primary read interpreted by Dr. Conley Rolls at [facility].   ASSESSMENT/PLAN: Encounter Diagnoses  Name Primary?  . Fever, unspecified Yes  . Acute bronchitis   . Cough    Rx Zpack Rx Hycodan OTC motrin/Tylenol prn fever F/u prrn Gross sideeffects, risk and benefits, and alternatives of medications d/w patient. Patient is aware that all medications have potential sideeffects and we are unable to predict every sideeffect or drug-drug interaction that may occur.  Hamilton Capri PHUONG, DO 04/23/2013 9:32 AM

## 2013-04-26 ENCOUNTER — Ambulatory Visit (INDEPENDENT_AMBULATORY_CARE_PROVIDER_SITE_OTHER): Payer: BC Managed Care – PPO | Admitting: Emergency Medicine

## 2013-04-26 ENCOUNTER — Ambulatory Visit: Payer: BC Managed Care – PPO

## 2013-04-26 VITALS — BP 136/88 | HR 74 | Temp 98.1°F | Resp 18 | Ht 64.5 in | Wt 283.6 lb

## 2013-04-26 DIAGNOSIS — H1089 Other conjunctivitis: Secondary | ICD-10-CM

## 2013-04-26 DIAGNOSIS — R05 Cough: Secondary | ICD-10-CM

## 2013-04-26 DIAGNOSIS — E1165 Type 2 diabetes mellitus with hyperglycemia: Secondary | ICD-10-CM

## 2013-04-26 DIAGNOSIS — R0981 Nasal congestion: Secondary | ICD-10-CM

## 2013-04-26 DIAGNOSIS — H109 Unspecified conjunctivitis: Secondary | ICD-10-CM

## 2013-04-26 DIAGNOSIS — R059 Cough, unspecified: Secondary | ICD-10-CM

## 2013-04-26 DIAGNOSIS — IMO0002 Reserved for concepts with insufficient information to code with codable children: Secondary | ICD-10-CM

## 2013-04-26 DIAGNOSIS — J3489 Other specified disorders of nose and nasal sinuses: Secondary | ICD-10-CM

## 2013-04-26 DIAGNOSIS — H9203 Otalgia, bilateral: Secondary | ICD-10-CM

## 2013-04-26 DIAGNOSIS — H9209 Otalgia, unspecified ear: Secondary | ICD-10-CM

## 2013-04-26 LAB — GLUCOSE, POCT (MANUAL RESULT ENTRY): POC Glucose: 93 mg/dl (ref 70–99)

## 2013-04-26 LAB — POCT CBC
Granulocyte percent: 72.1 %G (ref 37–80)
HCT, POC: 39.6 % (ref 37.7–47.9)
Hemoglobin: 12.1 g/dL — AB (ref 12.2–16.2)
Lymph, poc: 1.8 (ref 0.6–3.4)
MCH, POC: 26.6 pg — AB (ref 27–31.2)
MCHC: 30.6 g/dL — AB (ref 31.8–35.4)
MCV: 87 fL (ref 80–97)
MID (cbc): 0.7 (ref 0–0.9)
MPV: 7.5 fL (ref 0–99.8)
POC Granulocyte: 6.6 (ref 2–6.9)
POC LYMPH PERCENT: 20.3 %L (ref 10–50)
POC MID %: 7.6 %M (ref 0–12)
Platelet Count, POC: 210 10*3/uL (ref 142–424)
RBC: 4.55 M/uL (ref 4.04–5.48)
RDW, POC: 14.4 %
WBC: 9.1 10*3/uL (ref 4.6–10.2)

## 2013-04-26 MED ORDER — CIPROFLOXACIN HCL 0.3 % OP SOLN: 1.0000 [drp] | OPHTHALMIC | Status: DC

## 2013-04-26 MED ORDER — HYDROCODONE-ACETAMINOPHEN 10-325 MG PO TABS: 1.0000 | ORAL_TABLET | Freq: Three times a day (TID) | ORAL | Status: DC | PRN

## 2013-04-26 MED ORDER — AMOXICILLIN-POT CLAVULANATE 875-125 MG PO TABS: 1.0000 | ORAL_TABLET | Freq: Two times a day (BID) | ORAL | Status: DC

## 2013-04-26 NOTE — Progress Notes (Signed)
  Subjective:    Patient ID: Rachel Kim, female    DOB: 1955-08-07, 57 y.o.   MRN: [id number]  HPI pt here for a follow up of left ear otalgia,  sore throat, drainage from both eyes productive Cough (green) and congestion. Denies wheezing, sob, or fever  Worse  Symptom today left ear pain and sore throat.  rx z pack and codeine syrup with some relieve.    Review of Systems     Objective:   Physical Exam        Assessment & Plan:

## 2013-04-26 NOTE — Progress Notes (Signed)
Subjective:    Patient ID: Rachel Kim, female    DOB: Jan 14, 1956, 57 y.o.   MRN: [id number]  Sore Throat  Associated symptoms include congestion, coughing, ear discharge (right) and ear pain (right). Pertinent negatives include no headaches, shortness of breath or vomiting.  Cough Associated symptoms include chills, ear pain (right), eye redness, a fever, postnasal drip and rhinorrhea. Pertinent negatives include no chest pain, headaches, sore throat, shortness of breath or wheezing.  Ear Drainage  Associated symptoms include coughing, ear discharge (right) and rhinorrhea. Pertinent negatives include no headaches, sore throat or vomiting.    57 year old female presents for recheck of illness. Seen here 3 days ago and diagnosed with bronchitis and placed on a Zpack.  Admits cough has continued and does not seem to be improving much. She has now developed bilateral ear pain with left > right.  States it has been excruciating at times and now she has noticed drainage from her left ear.  Also has developed bilateral eye redness and copious purulent drainage especially from her left eye. Vision has been blurry due to drainage.  Denies headache or dizziness.  Has had fever at home of 100.0 - has been taking tylenol which has helped with the pain as well. No sinus pain, SOB, wheezing or hemoptysis.  She has had cough productive of yellow sputum.     Review of Systems  Constitutional: Positive for fever and chills.  HENT: Positive for ear pain (right), congestion, rhinorrhea, postnasal drip and ear discharge (right). Negative for sore throat and sinus pressure.   Eyes: Positive for discharge, redness and visual disturbance (blurry from driainage). Negative for pain.  Respiratory: Positive for cough. Negative for chest tightness, shortness of breath and wheezing.   Cardiovascular: Negative for chest pain.  Gastrointestinal: Negative for nausea and vomiting.  Neurological: Negative for dizziness and  headaches.       Objective:   Physical Exam  Constitutional: She is oriented to person, place, and time. She appears well-developed and well-nourished.  HENT:  Head: Normocephalic and atraumatic.  Right Ear: Hearing and ear canal normal. Tympanic membrane is bulging (dull).  Left Ear: Hearing normal. There is drainage. Tympanic membrane is perforated.  Mouth/Throat: Uvula is midline, oropharynx is clear and moist and mucous membranes are normal. No oropharyngeal exudate.  Eyes: EOM are normal. Pupils are equal, round, and reactive to light. Right eye exhibits discharge. Left eye exhibits discharge. Right conjunctiva is injected. Left conjunctiva is injected.  Copious purulent drainage from bilateral eyes   Neck: Normal range of motion. Neck supple.  Cardiovascular: Normal rate, regular rhythm and normal heart sounds.   Pulmonary/Chest: Effort normal and breath sounds normal.  Lymphadenopathy:    She has no cervical adenopathy.  Neurological: She is alert and oriented to person, place, and time.  Psychiatric: She has a normal mood and affect. Her behavior is normal. Judgment and thought content normal.      Results for orders placed in visit on 04/26/13  POCT CBC      Result Value Range   WBC 9.1  4.6 - 10.2 K/uL   Lymph, poc 1.8  0.6 - 3.4   POC LYMPH PERCENT 20.3  10 - 50 %L   MID (cbc) 0.7  0 - 0.9   POC MID % 7.6  0 - 12 %M   POC Granulocyte 6.6  2 - 6.9   Granulocyte percent 72.1  37 - 80 %G   RBC 4.55  4.04 -  5.48 M/uL   Hemoglobin 12.1 (*) 12.2 - 16.2 g/dL   HCT, POC 14.7  82.9 - 47.9 %   MCV 87.0  80 - 97 fL   MCH, POC 26.6 (*) 27 - 31.2 pg   MCHC 30.6 (*) 31.8 - 35.4 g/dL   RDW, POC 56.2     Platelet Count, POC 210  142 - 424 K/uL   MPV 7.5  0 - 99.8 fL  GLUCOSE, POCT (MANUAL RESULT ENTRY)      Result Value Range   POC Glucose 93  70 - 99 mg/dl   [facility] reading (PRIMARY) by  Dr. Cleta Alberts there are increased markings in the right middle lobe      Assessment &  Plan:  Cough - Plan: POCT CBC, DG Chest 2 View  Otalgia, bilateral - Plan: Wound culture  Bacterial conjunctivitis - Plan: Wound culture  Nasal congestion  Type II or unspecified type diabetes mellitus with unspecified complication, uncontrolled - Plan: POCT glucose (manual entry)

## 2013-04-28 ENCOUNTER — Ambulatory Visit (INDEPENDENT_AMBULATORY_CARE_PROVIDER_SITE_OTHER): Payer: BC Managed Care – PPO | Admitting: Emergency Medicine

## 2013-04-28 VITALS — BP 130/90 | HR 72 | Temp 99.0°F | Resp 16 | Ht 65.0 in | Wt 279.4 lb

## 2013-04-28 DIAGNOSIS — E119 Type 2 diabetes mellitus without complications: Secondary | ICD-10-CM

## 2013-04-28 DIAGNOSIS — H9203 Otalgia, bilateral: Secondary | ICD-10-CM

## 2013-04-28 DIAGNOSIS — H9209 Otalgia, unspecified ear: Secondary | ICD-10-CM

## 2013-04-28 DIAGNOSIS — H7290 Unspecified perforation of tympanic membrane, unspecified ear: Secondary | ICD-10-CM

## 2013-04-28 DIAGNOSIS — H7292 Unspecified perforation of tympanic membrane, left ear: Secondary | ICD-10-CM

## 2013-04-28 LAB — COMPREHENSIVE METABOLIC PANEL
AST: 21 U/L (ref 0–37)
Alkaline Phosphatase: 126 U/L — ABNORMAL HIGH (ref 39–117)
BUN: 25 mg/dL — ABNORMAL HIGH (ref 6–23)
Calcium: 8.9 mg/dL (ref 8.4–10.5)
Chloride: 101 mEq/L (ref 96–112)
Creat: 1.78 mg/dL — ABNORMAL HIGH (ref 0.50–1.10)
Potassium: 4.4 mEq/L (ref 3.5–5.3)
Total Bilirubin: 0.5 mg/dL (ref 0.3–1.2)

## 2013-04-28 MED ORDER — HYDROCODONE-ACETAMINOPHEN 10-325 MG PO TABS: ORAL_TABLET | ORAL | Status: DC

## 2013-04-28 MED ORDER — OFLOXACIN 0.3 % OT SOLN: 5.0000 [drp] | Freq: Every day | OTIC | Status: DC

## 2013-04-28 NOTE — Progress Notes (Signed)
  Subjective:    Patient ID: Rachel Kim, female    DOB: April 21, 1956, 57 y.o.   MRN: [id number]  HPI Patient presents for recheck of ear and eyes. Her eyes are much better, but her ear continues to be very painful. Patient using Hydrocodone 10/325 for pain every 8 hours. She is having severe pain in both ears. Her eyes are significantly better on drops. Her uterus continued to be severely painful and she has lost hearing.   Review of Systems     Objective:   Physical Exam her eyes are much improved. There is decreased drainage from both eyes. The right TM reveals distention of the drum. There is no perforation seen. Left TM is not well visualized. It is covered with a purulent anterior wall and there is an obvious perforation. Chest was clear to auscultation and percussion .        Assessment & Plan:  Will call with culture reports ENT referral made Meds ordered this encounter  Medications  . ofloxacin (FLOXIN) 0.3 % otic solution    Sig: Place 5 drops into the left ear daily.    Dispense:  10 mL    Refill:  0  Hydrocodone 10/325 increased to every 6 hours. Patient was up she couldn't not take the medication more frequently than every 6 hours because of the Tylenol in the medication and risk of liver injury. I did go ahead and take back her prescription for hydrocodone. I told her she could take the hydrocodone every 4 hours that she already has.

## 2013-04-28 NOTE — Patient Instructions (Signed)
Increase hydrocodone to one every six hours We will call you with a report of the culture Use the ear drops We will call you with the ENT appt

## 2013-04-29 LAB — WOUND CULTURE
Gram Stain: NONE SEEN
Gram Stain: NONE SEEN
Gram Stain: NONE SEEN
Gram Stain: NONE SEEN
Organism ID, Bacteria: NO GROWTH
Organism ID, Bacteria: NO GROWTH

## 2013-04-30 LAB — RESPIRATORY CULTURE OR RESPIRATORY AND SPUTUM CULTURE
Gram Stain: NONE SEEN
Organism ID, Bacteria: NORMAL

## 2013-05-01 ENCOUNTER — Telehealth: Payer: Self-pay

## 2013-05-01 NOTE — Telephone Encounter (Signed)
PT STATES SOMEONE CALLED REGARDING HER LABS. PLEASE CALL [phone]

## 2013-05-02 NOTE — Telephone Encounter (Signed)
Wound culture was negative, how is she? Called her left message.

## 2013-05-02 NOTE — Telephone Encounter (Signed)
She indicates she is no better. She states she can not hear, she sees the ENT tomorrow. She indicates the pain medications help

## 2013-05-10 ENCOUNTER — Encounter: Payer: Self-pay | Admitting: Certified Nurse Midwife

## 2013-05-13 ENCOUNTER — Ambulatory Visit: Payer: BC Managed Care – PPO | Admitting: Certified Nurse Midwife

## 2013-05-13 ENCOUNTER — Encounter: Payer: BC Managed Care – PPO | Admitting: Pharmacist Clinician (PhC)/ Clinical Pharmacy Specialist

## 2013-05-13 ENCOUNTER — Encounter: Payer: Self-pay | Admitting: Certified Nurse Midwife

## 2013-05-15 ENCOUNTER — Ambulatory Visit (INDEPENDENT_AMBULATORY_CARE_PROVIDER_SITE_OTHER): Payer: BC Managed Care – PPO | Admitting: Family Medicine

## 2013-05-15 VITALS — BP 138/84 | HR 84 | Temp 99.0°F | Resp 20 | Ht 64.5 in | Wt 278.2 lb

## 2013-05-15 DIAGNOSIS — J069 Acute upper respiratory infection, unspecified: Secondary | ICD-10-CM

## 2013-05-15 DIAGNOSIS — J029 Acute pharyngitis, unspecified: Secondary | ICD-10-CM

## 2013-05-15 DIAGNOSIS — R059 Cough, unspecified: Secondary | ICD-10-CM

## 2013-05-15 DIAGNOSIS — R05 Cough: Secondary | ICD-10-CM

## 2013-05-15 MED ORDER — METHYLPREDNISOLONE (PAK) 4 MG PO TABS: ORAL_TABLET | ORAL | Status: DC

## 2013-05-15 MED ORDER — MAGIC MOUTHWASH W/LIDOCAINE: 5.0000 mL | Freq: Three times a day (TID) | ORAL | Status: DC | PRN

## 2013-05-15 MED ORDER — HYDROCODONE-HOMATROPINE 5-1.5 MG/5ML PO SYRP: 5.0000 mL | ORAL_SOLUTION | Freq: Three times a day (TID) | ORAL | Status: DC | PRN

## 2013-05-15 MED ORDER — BENZONATATE 100 MG PO CAPS: 200.0000 mg | ORAL_CAPSULE | Freq: Two times a day (BID) | ORAL | Status: DC | PRN

## 2013-05-15 NOTE — Progress Notes (Signed)
[facility]:  Office Visit  Chief Complaint:  Chief Complaint  Patient presents with  . Sore Throat    pt states it hurts to swallow, and very painful  . Cough    some production, started 1 month ago  . Nasal Congestion    HPI: Rachel BERKE is a 57 y.o. female who is here for 4 week history of URI sxs DM well controlled, Sarcoid well controlled. Xray on 9/26 showed bronchial thickening without PNA.  She has green and dry cough, throughout day, has been pn zpack and also amoxacillin, without releif.  NO SOB, CP, does not feel in chest Ears are still full, left ear drum ruptured but healing  Has tried hycodan with some relief.  Felt better when she did not go to work  Past Medical History  Diagnosis Date  . Anxiety   . Depression   . Diabetes mellitus   . GERD (gastroesophageal reflux disease)   . Hyperlipidemia   . Hypertension   . Thyroid disease     hypothyroidism  . Sleep apnea     uses CPAP  . Sarcoidosis of lung   . Chronic kidney disease (CKD), stage III (moderate)   . RBBB   . LVH (left ventricular hypertrophy)     echo 02/25/10 EF >55%  . High cholesterol   . Hyperthyroidism     diagnosed 1989  . H/O breast biopsy 02/10/10    calcification , no tumors  . OSA (obstructive sleep apnea)     severe complex apnea  . CSA (central sleep apnea)   . Pneumonia 04/04   Past Surgical History  Procedure Laterality Date  . Tonsillectomy    . Appendectomy    . Retroperitoneal lymph node excision      right underarm  . Elbow fracture surgery      with pins and pins removed  . Rhinoplasty      has had several revisions  . Tubinates trimmed    . Laminectomy l5, s1    . Carpal tunnel release      right and left  . Rt knee arthroscopy    . Cholecystectomy    . Laminectomy c5-c6    . Abdominal hysterectomy  12/07   History   Social History  . Marital Status: Single    Spouse Name: N/A    Number of Children: N/A  . Years of Education: N/A    Social History Main Topics  . Smoking status: Never Smoker   . Smokeless tobacco: Never Used  . Alcohol Use: No  . Drug Use: No  . Sexual Activity: No   Other Topics Concern  . None   Social History Narrative   09/14/10-he uses 12 cm water pressure and feels she has a good mask fit.   Initial download : Residual AHI was 3.4 - excellent at 8 hours nightly use. We have still to receive a new 6 month download from Rachel. Refer to [facility] for download and pressure adjustment based on her desire to go on a flex or EPR setting. Epworth 10 points down from 20 last visit .   GERD is treated well, nocturia remains 2-4 nightly .   She did not tolerate Nuvigil when tried with another physician years ago.             Family History  Problem Relation Age of Onset  . Colitis Father   . Colon cancer Maternal Grandmother   . Hypertension  Mother   . Stroke Mother    Allergies  Allergen Reactions  . Actos [Pioglitazone Hydrochloride] Swelling  . Amlodipine Swelling  . Glipizide Swelling  . Clindamycin Hcl Rash   Prior to Admission medications   Medication Sig Start Date End Date Taking? Authorizing Provider  aspirin 81 MG tablet Take 81 mg by mouth daily.     Yes Historical Provider, MD  benazepril (LOTENSIN) 20 MG tablet Take 2 tablets (40 mg total) by mouth daily. 01/03/13  Yes Mihai Croitoru, MD  Cholecalciferol (VITAMIN D) 2000 UNITS CAPS Take by mouth daily.     Yes Historical Provider, MD  clonazePAM (KLONOPIN) 0.5 MG tablet Take 0.5 mg by mouth 2 (two) times daily as needed. Take 1/4 tablet as needed   Yes Historical Provider, MD  esomeprazole (NEXIUM) 40 MG capsule Take 1 capsule (40 mg total) by mouth daily before breakfast. PATIENT NEEDS OFFICE VISIT FOR ADDITIONAL REFILLS 04/11/13  Yes Heather M Marte, PA-C  indapamide (LOZOL) 1.25 MG tablet Take 1 tablet (1.25 mg total) by mouth daily. PATIENT NEEDS OFFICE VISIT FOR ADDITIONAL REFILLS 03/31/13  Yes Eleanore Delia Chimes, PA-C  Insulin Pen  Needle (B-D ULTRAFINE III SHORT PEN) 31G X 8 MM MISC Inject 1 each into the skin 2 (two) times daily. 04/09/13  Yes Reather Littler, MD  Insulin Pen Needle (B-D ULTRAFINE III SHORT PEN) 31G X 8 MM MISC Inject 1 each into the skin 2 (two) times daily. 04/09/13  Yes Reather Littler, MD  ketoconazole (NIZORAL) 2 % shampoo  12/26/12  Yes Historical Provider, MD  LANTUS SOLOSTAR 100 UNIT/ML injection 22 Units daily.  01/26/11  Yes Historical Provider, MD  levothyroxine (SYNTHROID, LEVOTHROID) 150 MCG tablet Take 150 mcg by mouth daily.   Yes Historical Provider, MD  LEXAPRO 20 MG tablet Take 30 mg by mouth daily.  12/17/10  Yes Historical Provider, MD  mirabegron ER (MYRBETRIQ) 50 MG TB24 Take by mouth daily.   Yes Historical Provider, MD  Nebivolol HCl 20 MG TABS Take 1 tablet (20 mg total) by mouth daily. 04/12/13  Yes Mihai Croitoru, MD  ofloxacin (FLOXIN) 0.3 % otic solution Place 5 drops into the left ear daily. 04/28/13  Yes Collene Gobble, MD  [facility] DELICA LANCETS 33G MISC  01/02/13  Yes Historical Provider, MD  Letta Pate VERIO test strip  01/02/13  Yes Historical Provider, MD  pravastatin (PRAVACHOL) 40 MG tablet Take 1 tablet (40 mg total) by mouth every evening. 01/03/13  Yes Mihai Croitoru, MD  Probiotic Product (PROBIOTIC FORMULA PO) Take by mouth daily.     Yes Historical Provider, MD  VICTOZA 18 MG/3ML SOLN Inject 1.8 mg into the skin daily.  01/26/11  Yes Historical Provider, MD  amoxicillin-clavulanate (AUGMENTIN) 875-125 MG per tablet Take 1 tablet by mouth 2 (two) times daily. 04/26/13   Heather Jaquita Rector, PA-C  azithromycin (ZITHROMAX) 250 MG tablet Take 2 tabs po now then 1 tab po daily. 04/23/13   Azael Ragain P Warrick Llera, DO  ciprofloxacin (CILOXAN) 0.3 % ophthalmic solution Place 1 drop into both eyes every 2 (two) hours. Administer 1 drop, every 2 hours, while awake, for 2 days. Then 1 drop, every 4 hours, while awake, for the next 5 days. 04/26/13   Heather Jaquita Rector, PA-C  HYDROcodone-homatropine (HYCODAN) 5-1.5 MG/5ML syrup  Take 5 mLs by mouth every 8 (eight) hours as needed for cough. 04/23/13   Ranbir Chew P Emerita Berkemeier, DO     ROS: The patient denies  night sweats, unintentional  weight loss, chest pain, palpitations, wheezing, dyspnea on exertion, nausea, vomiting, abdominal pain, dysuria, hematuria, melena, numbness, weakness, or tingling.   All other systems have been reviewed and were otherwise negative with the exception of those mentioned in the HPI and as above.    PHYSICAL EXAM: Filed Vitals:   05/15/13 0821  BP: 138/84  Pulse: 84  Temp: 99 F (37.2 C)  Resp: 20   Filed Vitals:   05/15/13 0821  Height: 5' 4.5" (1.638 m)  Weight: 278 lb 3.2 oz (126.191 kg)   Body mass index is 47.03 kg/(m^2).  General: Alert, no acute distress HEENT:  Normocephalic, atraumatic, oropharynx patent. EOMI, PERRLA Cardiovascular:  Regular rate and rhythm, no rubs murmurs or gallops.  No Carotid bruits, radial pulse intact. No pedal edema.  Respiratory: Clear to auscultation bilaterally.  No wheezes, rales, or rhonchi.  No cyanosis, no use of accessory musculature GI: No organomegaly, abdomen is soft and non-tender, positive bowel sounds.  No masses. Skin: No rashes. Neurologic: Facial musculature symmetric. Psychiatric: Patient is appropriate throughout our interaction. Lymphatic: No cervical lymphadenopathy Musculoskeletal: Gait intact.   LABS: Results for orders placed in visit on 04/28/13  COMPREHENSIVE METABOLIC PANEL      Result Value Range   Sodium 141  135 - 145 mEq/L   Potassium 4.4  3.5 - 5.3 mEq/L   Chloride 101  96 - 112 mEq/L   CO2 30  19 - 32 mEq/L   Glucose, Bld 165 (*) 70 - 99 mg/dL   BUN 25 (*) 6 - 23 mg/dL   Creat 1.61 (*) 0.96 - 1.10 mg/dL   Total Bilirubin 0.5  0.3 - 1.2 mg/dL   Alkaline Phosphatase 126 (*) 39 - 117 U/L   AST 21  0 - 37 U/L   ALT 20  0 - 35 U/L   Total Protein 6.8  6.0 - 8.3 g/dL   Albumin 3.9  3.5 - 5.2 g/dL   Calcium 8.9  8.4 - 04.5 mg/dL     EKG/XRAY:   Primary read  interpreted by Dr. Conley Rolls at [facility].   ASSESSMENT/PLAN: Encounter Diagnoses  Name Primary?  . Viral URI with cough Yes  . Acute pharyngitis   . Cough    Rx Prednisone taper, tessalon perles, hycodan, and also magic mouthwash prn  F/u in 72 hrs if no improvement Work note given [employer] gargles Use flonase if she has it Gross sideeffects, risk and benefits, and alternatives of medications d/w patient. Patient is aware that all medications have potential sideeffects and we are unable to predict every sideeffect or drug-drug interaction that may occur.  Demontre Padin PHUONG, DO 05/15/2013 8:56 AM

## 2013-05-15 NOTE — Patient Instructions (Signed)
Viral Pharyngitis Viral pharyngitis is a viral infection that produces redness, pain, and swelling (inflammation) of the throat. It can spread from person to person (contagious). CAUSES Viral pharyngitis is caused by inhaling a large amount of certain germs called viruses. Many different viruses cause viral pharyngitis. SYMPTOMS Symptoms of viral pharyngitis include:  Sore throat.  Tiredness.  Stuffy nose.  Low-grade fever.  Congestion.  Cough. TREATMENT Treatment includes rest, drinking plenty of fluids, and the use of over-the-counter medication (approved by your caregiver). HOME CARE INSTRUCTIONS   Drink enough fluids to keep your urine clear or pale yellow.  Eat soft, cold foods such as ice cream, frozen ice pops, or gelatin dessert.  Gargle with warm salt water (1 tsp salt per 1 qt of water).  If over age 7, throat lozenges may be used safely.  Only take over-the-counter or prescription medicines for pain, discomfort, or fever as directed by your caregiver. Do not take aspirin. To help prevent spreading viral pharyngitis to others, avoid:  Mouth-to-mouth contact with others.  Sharing utensils for eating and drinking.  Coughing around others. SEEK MEDICAL CARE IF:   You are better in a few days, then become worse.  You have a fever or pain not helped by pain medicines.  There are any other changes that concern you. Document Released: 04/27/2005 Document Revised: 10/10/2011 Document Reviewed: 09/23/2010 [employer] Patient Information 2014 [employer].  

## 2013-05-19 ENCOUNTER — Ambulatory Visit (INDEPENDENT_AMBULATORY_CARE_PROVIDER_SITE_OTHER): Payer: BC Managed Care – PPO | Admitting: Family Medicine

## 2013-05-19 VITALS — BP 138/88 | HR 59 | Temp 98.0°F | Resp 16 | Ht 65.0 in | Wt 275.0 lb

## 2013-05-19 DIAGNOSIS — E119 Type 2 diabetes mellitus without complications: Secondary | ICD-10-CM

## 2013-05-19 DIAGNOSIS — J029 Acute pharyngitis, unspecified: Secondary | ICD-10-CM

## 2013-05-19 DIAGNOSIS — H109 Unspecified conjunctivitis: Secondary | ICD-10-CM

## 2013-05-19 DIAGNOSIS — J0100 Acute maxillary sinusitis, unspecified: Secondary | ICD-10-CM

## 2013-05-19 MED ORDER — LEVOFLOXACIN 500 MG PO TABS: 500.0000 mg | ORAL_TABLET | Freq: Every day | ORAL | Status: DC

## 2013-05-19 MED ORDER — CIPROFLOXACIN HCL 0.3 % OP SOLN: 2.0000 [drp] | Freq: Four times a day (QID) | OPHTHALMIC | Status: DC

## 2013-05-19 NOTE — Patient Instructions (Signed)
1. Start Zyrtec 10mg  daily. 2. Call in five days if not significantly improved.

## 2013-05-19 NOTE — Progress Notes (Signed)
History and physical exam obtained with Deboraha Sprang, NP.  Agree with assessment and plan.

## 2013-05-19 NOTE — Progress Notes (Signed)
Subjective:    Patient ID: Rachel Kim, female    DOB: 29-Oct-1955, 57 y.o.   MRN: [id number]  HPI Patient reports today in follow up of URI. Feels like she is the same without improvement. Saw Dr. Conley Rolls 4 days ago and was prescribed Medrol dose pak, tessalon pearles, hydrocodan cough syrup and magic mouthwash with lidocaine. Throat remains painful where nose and throat meet as well as further down. Cough with green sputum throughout day. Cough syrup helps some, but cough keeping her up at night. Having episodes of feeling hot and cold. Did not take temperature since Thursday when highest temp 99.9.  Saw ENT and was started on flonase qd for ear fullness. Has not used flonase in several days. Starting to hear better. Ear membranes had healed.   Feels like congestion in upper chest, base of throat. Hears wheezing anteriorly. Tried Mucinex a couple of weeks ago without relief.  Mild headache in temporal region.  No facial pain or pressure.  Left eye has recurrent redness and drainage. Finsihed course of Cipro eye ointment. Had near resolution of redness, now returned. Feels like an eyelash in eye for several days. No photophobia.  Has not been checking blood sugar recently.  Review of Systems No SOB, no CP.     Objective:   Physical Exam  Nursing note and vitals reviewed. Constitutional: She is oriented to person, place, and time. She appears well-developed and well-nourished. No distress.  HENT:  Right Ear: External ear and ear canal normal.  Left Ear: External ear and ear canal normal.  Nose: Rhinorrhea present. Right sinus exhibits no maxillary sinus tenderness and no frontal sinus tenderness. Left sinus exhibits no maxillary sinus tenderness and no frontal sinus tenderness.  Mouth/Throat: Uvula is midline. Oropharyngeal exudate present. No posterior oropharyngeal edema or posterior oropharyngeal erythema.  Small amount scar tissue bil. TMs. Right nare with erythema. Thick yellow  post nasal drainage.  Eyes: Pupils are equal, round, and reactive to light. Right eye exhibits no discharge. Left eye exhibits discharge.  Left eye with injected conjunctiva and small amount white discharge.  Neck: Normal range of motion. Neck supple.  Cardiovascular: Normal rate, regular rhythm and normal heart sounds.   Pulmonary/Chest: Effort normal and breath sounds normal. She has no wheezes. She has no rales.  Lymphadenopathy:    She has no cervical adenopathy.  Neurological: She is alert and oriented to person, place, and time.  Skin: Skin is warm and dry. She is not diaphoretic.  Psychiatric: She has a normal mood and affect. Her behavior is normal. Judgment and thought content normal.      Assessment & Plan:  Acute maxillary sinusitis  Conjunctivitis, left eye  Acute pharyngitis  Type II or unspecified type diabetes mellitus without mention of complication, not stated as uncontrolled  Meds ordered this encounter  Medications  . levofloxacin (LEVAQUIN) 500 MG tablet    Sig: Take 1 tablet (500 mg total) by mouth daily.    Dispense:  7 tablet    Refill:  0  . ciprofloxacin (CILOXAN) 0.3 % ophthalmic solution    Sig: Place 2 drops into the left eye 4 (four) times daily.    Dispense:  10 mL    Refill:  0   1- acute max sinusitis- patient with continued drainage. She is to finish Medrol dose pak. Start levoflxacin 500 mg po  Qd x 7 days. Start Zyrtec 10 mg po qd. 2- conjunctivitis- refil of Cipro opthalmic solution 3-pharyngitis- above measures,  can take NSAIDs for pain and continue previously prescribed magic mouthwash. 4- DM- encouraged patient to monitor blood sugars while ill and on prednisone.  Patient will call if not significantly improved in 5 days. May need additional prednisone.

## 2013-06-11 ENCOUNTER — Encounter: Payer: Self-pay | Admitting: Pharmacist Clinician (PhC)/ Clinical Pharmacy Specialist

## 2013-06-11 ENCOUNTER — Ambulatory Visit (INDEPENDENT_AMBULATORY_CARE_PROVIDER_SITE_OTHER): Payer: BC Managed Care – PPO | Admitting: Certified Nurse Midwife

## 2013-06-11 ENCOUNTER — Encounter: Payer: Self-pay | Admitting: Certified Nurse Midwife

## 2013-06-11 ENCOUNTER — Ambulatory Visit (INDEPENDENT_AMBULATORY_CARE_PROVIDER_SITE_OTHER): Payer: BC Managed Care – PPO | Admitting: Pharmacist Clinician (PhC)/ Clinical Pharmacy Specialist

## 2013-06-11 VITALS — BP 122/80 | HR 72 | Resp 16 | Ht 64.25 in | Wt 280.0 lb

## 2013-06-11 VITALS — BP 144/92 | Ht 65.0 in | Wt 274.5 lb

## 2013-06-11 DIAGNOSIS — Z0000 Encounter for general adult medical examination without abnormal findings: Secondary | ICD-10-CM

## 2013-06-11 DIAGNOSIS — I10 Essential (primary) hypertension: Secondary | ICD-10-CM

## 2013-06-11 DIAGNOSIS — Z01419 Encounter for gynecological examination (general) (routine) without abnormal findings: Secondary | ICD-10-CM

## 2013-06-11 LAB — COMPREHENSIVE METABOLIC PANEL
AST: 18 U/L (ref 0–37)
Albumin: 3.9 g/dL (ref 3.5–5.2)
Alkaline Phosphatase: 128 U/L — ABNORMAL HIGH (ref 39–117)
Calcium: 8.7 mg/dL (ref 8.4–10.5)
Chloride: 102 mEq/L (ref 96–112)
Glucose, Bld: 133 mg/dL — ABNORMAL HIGH (ref 70–99)
Potassium: 4.5 mEq/L (ref 3.5–5.3)
Sodium: 142 mEq/L (ref 135–145)
Total Protein: 6.8 g/dL (ref 6.0–8.3)

## 2013-06-11 LAB — POCT URINALYSIS DIPSTICK
Bilirubin, UA: NEGATIVE
Blood, UA: NEGATIVE
Ketones, UA: NEGATIVE
Nitrite, UA: NEGATIVE
Urobilinogen, UA: NEGATIVE
pH, UA: 5

## 2013-06-11 LAB — LIPID PANEL: Total CHOL/HDL Ratio: 3.8 Ratio

## 2013-06-11 NOTE — Progress Notes (Signed)
06/11/2013 Rachel Kim 14-Jun-1956 [id number]   HPI:  Rachel Kim is a 57 y.o. female patient of Dr Royann Shivers, with a PMH below who presents today for a blood pressure check.  She recently started Bystolic 20mg  daily and has been tolerating this well.  She had previous problems with carvedilol and metoprolol, when the dose was raised to help with BP, she would develop fatigue and have trouble keeping up with her work.  She cannot tolerate calcium channel blockers due to ankle edema.  Current BP regimen consists of benazepril 40mg , indapamide 1.25mg  and Bystolic 20mg , all of which she takes each morning.   However today she has not had her meds, as she had a blood draw this am.  She uses CPAP regularly, does not drink or smoke.  She does not exercise and has no knee, hip or back problems that would prevent her from doing some exercise.  She lives alone and eats approximately 1/2 of her meals out.  She is trying to eat more at home, but has never been much of a cook, is now trying to learn.  When she eats at home she does not add salt to the food.   She does not have a blood pressure cuff at home, her previous one was too small to be accurate on her arm.  She states that readings at other offices in the past mont or two have all been 140/80 range.  She has asked about where she might by a cuff and sphygmomanometer.   Current Outpatient Prescriptions  Medication Sig Dispense Refill  . aspirin 81 MG tablet Take 81 mg by mouth daily.        . benazepril (LOTENSIN) 20 MG tablet Take 2 tablets (40 mg total) by mouth daily.  90 tablet  3  . Cholecalciferol (VITAMIN D) 2000 UNITS CAPS Take by mouth daily.        . clonazePAM (KLONOPIN) 0.5 MG tablet Take 0.5 mg by mouth 2 (two) times daily as needed. Take 1/4 tablet as needed      . esomeprazole (NEXIUM) 40 MG capsule Take 1 capsule (40 mg total) by mouth daily before breakfast. PATIENT NEEDS OFFICE VISIT FOR ADDITIONAL REFILLS  90 capsule  0  .  indapamide (LOZOL) 1.25 MG tablet Take 1 tablet (1.25 mg total) by mouth daily. PATIENT NEEDS OFFICE VISIT FOR ADDITIONAL REFILLS  30 tablet  0  . Insulin Pen Needle (B-D ULTRAFINE III SHORT PEN) 31G X 8 MM MISC Inject 1 each into the skin 2 (two) times daily.  30 each  0  . Insulin Pen Needle (B-D ULTRAFINE III SHORT PEN) 31G X 8 MM MISC Inject 1 each into the skin 2 (two) times daily.  200 each  1  . ketoconazole (NIZORAL) 2 % shampoo       . LANTUS SOLOSTAR 100 UNIT/ML injection 22 Units daily.       Marland Kitchen levofloxacin (LEVAQUIN) 500 MG tablet Take 1 tablet (500 mg total) by mouth daily.  7 tablet  0  . levothyroxine (SYNTHROID, LEVOTHROID) 150 MCG tablet Take 150 mcg by mouth daily.      Marland Kitchen LEXAPRO 20 MG tablet Take 30 mg by mouth daily.       . mirabegron ER (MYRBETRIQ) 50 MG TB24 Take by mouth daily.      . Nebivolol HCl 20 MG TABS Take 1 tablet (20 mg total) by mouth daily.  90 tablet  3  . ONETOUCH  DELICA LANCETS 33G MISC       . ONETOUCH VERIO test strip       . pravastatin (PRAVACHOL) 40 MG tablet Take 1 tablet (40 mg total) by mouth every evening.  90 tablet  3  . Probiotic Product (PROBIOTIC FORMULA PO) Take by mouth daily.        Marland Kitchen VICTOZA 18 MG/3ML SOLN Inject 1.8 mg into the skin daily.        Current Facility-Administered Medications  Medication Dose Route Frequency Provider Last Rate Last Dose  . 0.9 %  sodium chloride infusion  500 mL Intravenous Continuous Meryl Dare, MD        Allergies  Allergen Reactions  . Actos [Pioglitazone Hydrochloride] Swelling  . Amlodipine Swelling  . Glipizide Swelling  . Clindamycin Hcl Rash    Past Medical History  Diagnosis Date  . Anxiety   . Depression   . Diabetes mellitus   . GERD (gastroesophageal reflux disease)   . Hyperlipidemia   . Hypertension   . Thyroid disease     hypothyroidism  . Sleep apnea     uses CPAP  . Sarcoidosis of lung   . Chronic kidney disease (CKD), stage III (moderate)   . RBBB   . LVH (left  ventricular hypertrophy)     echo 02/25/10 EF >55%  . High cholesterol   . Hyperthyroidism     diagnosed 1989  . H/O breast biopsy 02/10/10    calcification , no tumors  . OSA (obstructive sleep apnea)     severe complex apnea  . CSA (central sleep apnea)   . Pneumonia 04/04    Blood pressure 144/92, height 5\' 5"  (1.651 m), weight 274 lb 8 oz (124.512 kg).   ASSESSMENT AND PLAN:  Today her blood pressure is close to the goal of <140/80.  She is diabetic, on insulin and Victoza, so I would go by the ADA guidelines rather than the JNC guidelines.  However her pressure is not as bad as it could be considering that her last dose of these meds was >24 hours ago.  I have asked that she continue her current meds, but take the benazepril and indapamide each morning and move the Bystolic to evenings.  Hopefully this will give her a more balanced BP control throughout the day.  I have suggested that she go to [facility] and have the staff there help her to find an appropriate size BP cuff and make sure she can use it.  I have also encouraged her to start exercising, walking 20-30 minutes per day at least 3-4 times per week.  I have explained that just increasing her exercise will help to control her BP and maybe prevent [place] from needing another agent in the near future.  I will see her back in 2 months for a follow up, and have asked her to keep track of her BP readings until that time.  Phillips Hay PharmD CPP [facility]

## 2013-06-11 NOTE — Patient Instructions (Signed)
Your blood pressure today is okay  At 144/92 Check your blood pressure at home daily (if able) and keep record of the readings.  Return visit 2 months  Take your BP meds as follows:  AM: benazepril 40mg           Indapamide 1.25mg    PM: Bystolic 20mg    Bring all of your meds, your BP cuff and your record of home blood pressures to your next appointment.  Exercise as you're able, try to walk approximately 30 minutes per day.  Keep salt intake to a minimum, especially watch canned and prepared boxed foods.  Eat more fresh fruits and vegetables and fewer canned items.  Avoid eating in fast food restaurants.    HOW TO TAKE YOUR BLOOD PRESSURE:   Rest 5 minutes before taking your blood pressure.    Don't smoke or drink caffeinated beverages for at least 30 minutes before.   Take your blood pressure before (not after) you eat.   Sit comfortably with your back supported and both feet on the floor (don't cross your legs).   Elevate your arm to heart level on a table or a desk.   Use the proper sized cuff. It should fit smoothly and snugly around your bare upper arm. There should be enough room to slip a fingertip under the cuff. The bottom edge of the cuff should be 1 inch above the crease of the elbow.   Ideally, take 3 measurements at one sitting and record the average.

## 2013-06-11 NOTE — Patient Instructions (Signed)

## 2013-06-11 NOTE — Progress Notes (Signed)
57 y.o. G0P0000 Single Caucasian Fe here for annual exam. Menopausal no HRT. Denies vaginal bleeding or vaginal dryness. Tried Olive Oil for dryness. Now on new hypertension medication working well , diabetes stable on medication. Has appointment endocrinologist who specializes in obesity and diabetes to help stabilize the glucose level. No other health issues.  Patient's last menstrual period was 07/01/2006.          Sexually active: no  The current method of family planning is status post hysterectomy.    Exercising: no  exercise Smoker:  no  Health Maintenance: Pap:  05-04-10 neg MMG:  04/26/13 neg Colonoscopy:  2012 neg 10 years BMD:   2011 TDaP:  2012 Labs: Poct urine-neg Self breast exam: done monthly   reports that she has never smoked. She has never used smokeless tobacco. She reports that she does not drink alcohol or use illicit drugs.  Past Medical History  Diagnosis Date  . Anxiety   . Depression   . Diabetes mellitus   . GERD (gastroesophageal reflux disease)   . Hyperlipidemia   . Hypertension   . Thyroid disease     hypothyroidism  . Sleep apnea     uses CPAP  . Sarcoidosis of lung   . Chronic kidney disease (CKD), stage III (moderate)   . RBBB   . LVH (left ventricular hypertrophy)     echo 02/25/10 EF >55%  . High cholesterol   . Hyperthyroidism     diagnosed 1989  . H/O breast biopsy 02/10/10    calcification , no tumors  . OSA (obstructive sleep apnea)     severe complex apnea  . CSA (central sleep apnea)   . Pneumonia 04/04    Past Surgical History  Procedure Laterality Date  . Tonsillectomy    . Appendectomy    . Retroperitoneal lymph node excision      right underarm  . Elbow fracture surgery      with pins and pins removed  . Rhinoplasty      has had several revisions  . Tubinates trimmed    . Laminectomy l5, s1    . Carpal tunnel release      right and left  . Rt knee arthroscopy    . Cholecystectomy    . Laminectomy c5-c6    .  Abdominal hysterectomy  12/07    Current Outpatient Prescriptions  Medication Sig Dispense Refill  . aspirin 81 MG tablet Take 81 mg by mouth daily.        . benazepril (LOTENSIN) 20 MG tablet Take 2 tablets (40 mg total) by mouth daily.  90 tablet  3  . cetirizine (ZYRTEC) 10 MG tablet Take 10 mg by mouth daily.      . Cholecalciferol (VITAMIN D) 2000 UNITS CAPS Take by mouth daily.        . clonazePAM (KLONOPIN) 0.5 MG tablet Take 0.5 mg by mouth 2 (two) times daily as needed. Take 1/4 tablet as needed      . esomeprazole (NEXIUM) 40 MG capsule Take 1 capsule (40 mg total) by mouth daily before breakfast. PATIENT NEEDS OFFICE VISIT FOR ADDITIONAL REFILLS  90 capsule  0  . indapamide (LOZOL) 1.25 MG tablet Take 1 tablet (1.25 mg total) by mouth daily. PATIENT NEEDS OFFICE VISIT FOR ADDITIONAL REFILLS  30 tablet  0  . Insulin Pen Needle (B-D ULTRAFINE III SHORT PEN) 31G X 8 MM MISC Inject 1 each into the skin 2 (two) times daily.  30  each  0  . Insulin Pen Needle (B-D ULTRAFINE III SHORT PEN) 31G X 8 MM MISC Inject 1 each into the skin 2 (two) times daily.  200 each  1  . ketoconazole (NIZORAL) 2 % shampoo       . LANTUS SOLOSTAR 100 UNIT/ML injection 22 Units daily.       Marland Kitchen levothyroxine (SYNTHROID, LEVOTHROID) 150 MCG tablet Take 150 mcg by mouth daily.      Marland Kitchen LEXAPRO 20 MG tablet Take 30 mg by mouth daily. Take 1 1/2 daily      . mirabegron ER (MYRBETRIQ) 50 MG TB24 Take by mouth daily.      . Nebivolol HCl 20 MG TABS Take 1 tablet (20 mg total) by mouth daily.  90 tablet  3  . ONETOUCH DELICA LANCETS 33G MISC       . ONETOUCH VERIO test strip       . pravastatin (PRAVACHOL) 40 MG tablet Take 1 tablet (40 mg total) by mouth every evening.  90 tablet  3  . VICTOZA 18 MG/3ML SOLN Inject 1.8 mg into the skin daily.       . Probiotic Product (PROBIOTIC FORMULA PO) Take by mouth daily.         No current facility-administered medications for this visit.    Family History  Problem Relation  Age of Onset  . Colitis Father   . Thyroid disease Father   . Colon cancer Maternal Grandmother   . Hypertension Mother   . Stroke Mother   . Thyroid disease Mother   . Thyroid disease Sister   . Thyroid disease Brother   . Thyroid disease Sister   . Thyroid disease Brother     ROS:  Pertinent items are noted in HPI.  Otherwise, a comprehensive ROS was negative.  Exam:   BP 122/80  Pulse 72  Resp 16  Ht 5' 4.25" (1.632 m)  Wt 280 lb (127.007 kg)  BMI 47.69 kg/m2  LMP 07/01/2006 Height: 5' 4.25" (163.2 cm)  Ht Readings from Last 3 Encounters:  06/11/13 5' 4.25" (1.632 m)  06/11/13 5\' 5"  (1.651 m)  05/19/13 5\' 5"  (1.651 m)    General appearance: alert, cooperative and appears stated age Head: Normocephalic, without obvious abnormality, atraumatic Neck: no adenopathy, supple, symmetrical, trachea midline and thyroid normal to inspection and palpation Lungs: clear to auscultation bilaterally Breasts: normal appearance, no masses or tenderness, No nipple retraction or dimpling, No nipple discharge or bleeding, No axillary or supraclavicular adenopathy Heart: regular rate and rhythm Abdomen: soft, non-tender; no masses,  no organomegaly Extremities: extremities normal, atraumatic, no cyanosis or edema Skin: Skin color, texture, turgor normal. No rashes or lesions Lymph nodes: Cervical, supraclavicular, and axillary nodes normal. No abnormal inguinal nodes palpated Neurologic: Grossly normal   Pelvic: External genitalia:  no lesions              Urethra:  normal appearing urethra with no masses, tenderness or lesions              Bartholin's and Skene's: normal                 Vagina: normal appearing vagina with normal color and discharge, no lesions              Cervix: absent              Pap taken: no Bimanual Exam:  Uterus:  uterus absent  Adnexa: no mass, fullness, tenderness and bilateral adnexa absent               Rectovaginal: Confirms                Anus:  normal sphincter tone, no lesions  A:  Well Woman with normal exam  Menopausal S/P TAH with BSO bleeding ,adenomyosis  IDDM stable control with Endocrine management  Hypertension stable medication with PCP management  P:   Reviewed health and wellness pertinent to exam  Continue follow up as indicated  Pap smear as per guidelines   Mammogram yearly with 3 D pap smear not taken today  counseled on breast self exam, mammography screening, adequate intake of calcium and vitamin D, diet and exercise return annually or prn  An After Visit Summary was printed and given to the patient.

## 2013-06-17 NOTE — Progress Notes (Signed)
Note reviewed, agree with plan.  Satya Buttram, MD  

## 2013-06-26 ENCOUNTER — Encounter: Payer: Self-pay | Admitting: Pharmacist Clinician (PhC)/ Clinical Pharmacy Specialist

## 2013-06-26 ENCOUNTER — Ambulatory Visit: Payer: BC Managed Care – PPO | Admitting: Neurology

## 2013-07-18 NOTE — Addendum Note (Signed)
Addended by: Verner Chol on: 07/18/2013 07:52 AM   Modules accepted: Level of Service

## 2013-08-08 ENCOUNTER — Ambulatory Visit (INDEPENDENT_AMBULATORY_CARE_PROVIDER_SITE_OTHER): Payer: BC Managed Care – PPO | Admitting: Emergency Medicine

## 2013-08-08 VITALS — BP 182/110 | HR 78 | Temp 98.7°F | Resp 20 | Ht 65.0 in | Wt 292.4 lb

## 2013-08-08 DIAGNOSIS — I10 Essential (primary) hypertension: Secondary | ICD-10-CM

## 2013-08-08 DIAGNOSIS — E119 Type 2 diabetes mellitus without complications: Secondary | ICD-10-CM

## 2013-08-08 MED ORDER — LIRAGLUTIDE 18 MG/3ML ~~LOC~~ SOPN: 1.8000 mg | PEN_INJECTOR | Freq: Every day | SUBCUTANEOUS | Status: DC

## 2013-08-08 MED ORDER — INDAPAMIDE 1.25 MG PO TABS: 1.2500 mg | ORAL_TABLET | Freq: Every day | ORAL | Status: DC

## 2013-08-08 MED ORDER — NEBIVOLOL HCL 20 MG PO TABS: 1.0000 | ORAL_TABLET | Freq: Every day | ORAL | Status: DC

## 2013-08-08 NOTE — Progress Notes (Signed)
[facility] [address] [place] [address] [phone]  Date:  08/08/2013   Name:  Rachel Kim   DOB:  [birth year]   MRN:  [id number]  PCP:  Jenny Reichmann, MD    Chief Complaint: Medication Refill   History of Present Illness:  Rachel Kim is a 58 y.o. very pleasant female patient who presents with the following:  Hypertension and NIDDM.  Out of Indapamide a week.  Requesting refills.  Last labs in November and is pending a change in endocrinologists.  No end organ symptoms.  No improvement with over the counter medications or other home remedies. Denies other complaint or health concern today.   Patient Active Problem List   Diagnosis Date Noted  . OSA on CPAP 12/19/2012  . Hypersomnia, persistent 12/19/2012  . H/O mammogram 03/28/2012  . SARCOIDOSIS 09/28/2006  . HYPOTHYROIDISM, UNSPECIFIED 09/28/2006  . Controlled diabetes mellitus type 2 with complications 95/63/8756  . Mixed hyperlipidemia 09/28/2006  . Morbid obesity 09/28/2006  . DEPRESSION, MAJOR, RECURRENT 09/28/2006  . HYPERTENSION, BENIGN SYSTEMIC 09/28/2006  . RHINITIS, ALLERGIC 09/28/2006  . DERMATITIS NOS 09/28/2006  . CERVICAL SPINE DISORDER, NOS 09/28/2006  . INSOMNIA NOS 09/28/2006  . APNEA, SLEEP 09/28/2006    Past Medical History  Diagnosis Date  . Anxiety   . Depression   . Diabetes mellitus   . GERD (gastroesophageal reflux disease)   . Hyperlipidemia   . Hypertension   . Thyroid disease     hypothyroidism  . Sleep apnea     uses CPAP  . Sarcoidosis of lung   . Chronic kidney disease (CKD), stage III (moderate)   . RBBB   . LVH (left ventricular hypertrophy)     echo 02/25/10 EF >55%  . High cholesterol   . Hyperthyroidism     diagnosed 1989  . H/O breast biopsy 02/10/10    calcification , no tumors  . OSA (obstructive sleep apnea)     severe complex apnea  . CSA (central sleep apnea)   . Pneumonia 04/04    Past Surgical History  Procedure Laterality  Date  . Tonsillectomy    . Appendectomy    . Retroperitoneal lymph node excision      right underarm  . Elbow fracture surgery      with pins and pins removed  . Rhinoplasty      has had several revisions  . Tubinates trimmed    . Laminectomy l5, s1    . Carpal tunnel release      right and left  . Rt knee arthroscopy    . Cholecystectomy    . Laminectomy c5-c6    . Abdominal hysterectomy  12/07    History  Substance Use Topics  . Smoking status: Never Smoker   . Smokeless tobacco: Never Used  . Alcohol Use: No    Family History  Problem Relation Age of Onset  . Colitis Father   . Thyroid disease Father   . Colon cancer Maternal Grandmother   . Hypertension Mother   . Stroke Mother   . Thyroid disease Mother   . Thyroid disease Sister   . Thyroid disease Brother   . Thyroid disease Sister   . Thyroid disease Brother     Allergies  Allergen Reactions  . Actos [Pioglitazone Hydrochloride] Swelling  . Amlodipine Swelling  . Glipizide Swelling  . Clindamycin Hcl Rash    Medication list has been reviewed and updated.  Current Outpatient Prescriptions on File Prior to Visit  Medication Sig Dispense Refill  . aspirin 81 MG tablet Take 81 mg by mouth daily.        . benazepril (LOTENSIN) 20 MG tablet Take 2 tablets (40 mg total) by mouth daily.  90 tablet  3  . cetirizine (ZYRTEC) 10 MG tablet Take 10 mg by mouth daily.      . Cholecalciferol (VITAMIN D) 2000 UNITS CAPS Take by mouth daily.        . clonazePAM (KLONOPIN) 0.5 MG tablet Take 0.5 mg by mouth 2 (two) times daily as needed. Take 1/4 tablet as needed      . esomeprazole (NEXIUM) 40 MG capsule Take 1 capsule (40 mg total) by mouth daily before breakfast. PATIENT NEEDS OFFICE VISIT FOR ADDITIONAL REFILLS  90 capsule  0  . indapamide (LOZOL) 1.25 MG tablet Take 1 tablet (1.25 mg total) by mouth daily. PATIENT NEEDS OFFICE VISIT FOR ADDITIONAL REFILLS  30 tablet  0  . Insulin Pen Needle (B-D ULTRAFINE III  SHORT PEN) 31G X 8 MM MISC Inject 1 each into the skin 2 (two) times daily.  30 each  0  . Insulin Pen Needle (B-D ULTRAFINE III SHORT PEN) 31G X 8 MM MISC Inject 1 each into the skin 2 (two) times daily.  200 each  1  . LANTUS SOLOSTAR 100 UNIT/ML injection 22 Units daily.       Marland Kitchen levothyroxine (SYNTHROID, LEVOTHROID) 150 MCG tablet Take 150 mcg by mouth daily.      Marland Kitchen LEXAPRO 20 MG tablet Take 30 mg by mouth daily. Take 1 1/2 daily      . mirabegron ER (MYRBETRIQ) 50 MG TB24 Take by mouth daily.      Glory Rosebush DELICA LANCETS 67M MISC       . ONETOUCH VERIO test strip       . pravastatin (PRAVACHOL) 40 MG tablet Take 1 tablet (40 mg total) by mouth every evening.  90 tablet  3  . Probiotic Product (PROBIOTIC FORMULA PO) Take by mouth daily.        Marland Kitchen VICTOZA 18 MG/3ML SOLN Inject 1.8 mg into the skin daily.       Marland Kitchen ketoconazole (NIZORAL) 2 % shampoo       . Nebivolol HCl 20 MG TABS Take 1 tablet (20 mg total) by mouth daily.  90 tablet  3   No current facility-administered medications on file prior to visit.    Review of Systems:  As per HPI, otherwise negative.    Physical Examination: Filed Vitals:   08/08/13 1854  BP: 182/110  Pulse: 78  Temp: 98.7 F (37.1 C)  Resp: 20   Filed Vitals:   08/08/13 1854  Height: 5\' 5"  (1.651 m)  Weight: 292 lb 6.4 oz (132.632 kg)   Body mass index is 48.66 kg/(m^2). Ideal Body Weight: Weight in (lb) to have BMI = 25: 149.9  GEN: obese, NAD, Non-toxic, A & O x 3 HEENT: Atraumatic, Normocephalic. Neck supple. No masses, No LAD. Ears and Nose: No external deformity. CV: RRR, No M/G/R. No JVD. No thrill. No extra heart sounds. PULM: CTA B, no wheezes, crackles, rhonchi. No retractions. No resp. distress. No accessory muscle use. ABD: S, NT, ND, +BS. No rebound. No HSM. EXTR: No c/c/e NEURO Normal gait.  PSYCH: Normally interactive. Conversant. Not depressed or anxious appearing.  Calm demeanor.    Assessment and Plan: Hypertension that  is out of control  off one medication Refill Follow up in one week for recheck BP   Signed,  Ellison Carwin, MD

## 2013-08-08 NOTE — Patient Instructions (Signed)

## 2013-08-15 ENCOUNTER — Telehealth: Payer: Self-pay

## 2013-08-15 MED ORDER — NEBIVOLOL HCL 10 MG PO TABS: 20.0000 mg | ORAL_TABLET | Freq: Every day | ORAL | Status: DC

## 2013-08-15 NOTE — Telephone Encounter (Signed)
Pharm sent req to change bystolic Rx to 10 mg tablets, take 2 tabs QD due to back order on 20 mg tablets. I resent new Rx.

## 2013-09-02 ENCOUNTER — Telehealth: Payer: Self-pay

## 2013-09-02 NOTE — Telephone Encounter (Signed)
Pended medication- Ok to send in one month supply?

## 2013-09-02 NOTE — Telephone Encounter (Signed)
Patient is asking if we can send in a 1 month rx of synthroid 184mcg to [facility] [address]. She normally gets it from her endocrinologist, but she is changing providers and doesn't have her appt with the new endocrinologist until 02/24 and needs something to hold her over until then.  Best [phone]

## 2013-09-02 NOTE — Telephone Encounter (Signed)
Yes, please.

## 2013-09-08 MED ORDER — LEVOTHYROXINE SODIUM 150 MCG PO TABS: 150.0000 ug | ORAL_TABLET | Freq: Every day | ORAL | Status: DC

## 2013-09-08 NOTE — Telephone Encounter (Signed)
LM sent in one month supply.

## 2013-09-13 ENCOUNTER — Ambulatory Visit (INDEPENDENT_AMBULATORY_CARE_PROVIDER_SITE_OTHER): Payer: BC Managed Care – PPO | Admitting: Emergency Medicine

## 2013-09-13 VITALS — BP 180/120 | HR 81 | Temp 98.4°F | Resp 16 | Ht 64.75 in | Wt 287.4 lb

## 2013-09-13 DIAGNOSIS — J329 Chronic sinusitis, unspecified: Secondary | ICD-10-CM

## 2013-09-13 MED ORDER — LEVOFLOXACIN 500 MG PO TABS: 500.0000 mg | ORAL_TABLET | Freq: Every day | ORAL | Status: AC

## 2013-09-13 NOTE — Progress Notes (Signed)
   Subjective:    Patient ID: Rachel Kim, female    DOB: 1955-11-13, 58 y.o.   MRN: [id number]  HPI  58 y/o female.  Fatigue, runny nose, post nasal drainage. Possible sinus infection. Would like to be referred to ENT, patient states at the end of last fall when she took around of Levaquin for sinusitis she felt remarkably better for about 30 days she states she slept better she felt less fatigued and she agreed better with her CPAP machine. She is concerned that she possibly has an ongoing sinusitis that has responded to antibiotics but has not totally cleared   .Review of Systems     Objective:   Physical Exam TMs are clear. Nose is normal except for some nasal congestion. The throat is clear. Neck is supple. Chest is clear to both auscultation and percussion.       Assessment & Plan:  We'll give a try  10 days of Levaquin. She is instructed to take probiotics along with the Levaquin. She will followup with Dr. Wilburn Cornelia to consider scanning to see if she does have a pocket of chronic sinusitis.

## 2013-10-01 ENCOUNTER — Other Ambulatory Visit: Payer: Self-pay

## 2013-10-01 MED ORDER — INSULIN PEN NEEDLE 31G X 8 MM MISC: 1.0000 | Freq: Two times a day (BID) | Status: DC

## 2013-10-26 ENCOUNTER — Telehealth: Payer: Self-pay

## 2013-10-26 ENCOUNTER — Ambulatory Visit (INDEPENDENT_AMBULATORY_CARE_PROVIDER_SITE_OTHER): Payer: BC Managed Care – PPO | Admitting: Emergency Medicine

## 2013-10-26 VITALS — BP 128/82 | HR 78 | Temp 98.0°F | Resp 17 | Ht 66.0 in | Wt 288.0 lb

## 2013-10-26 DIAGNOSIS — K219 Gastro-esophageal reflux disease without esophagitis: Secondary | ICD-10-CM

## 2013-10-26 LAB — COMPREHENSIVE METABOLIC PANEL
ALBUMIN: 3.9 g/dL (ref 3.5–5.2)
ALT: 22 U/L (ref 0–35)
AST: 24 U/L (ref 0–37)
Alkaline Phosphatase: 120 U/L — ABNORMAL HIGH (ref 39–117)
BUN: 30 mg/dL — AB (ref 6–23)
CHLORIDE: 103 meq/L (ref 96–112)
CO2: 28 meq/L (ref 19–32)
CREATININE: 1.7 mg/dL — AB (ref 0.50–1.10)
Calcium: 8.8 mg/dL (ref 8.4–10.5)
Glucose, Bld: 186 mg/dL — ABNORMAL HIGH (ref 70–99)
Potassium: 4.3 mEq/L (ref 3.5–5.3)
Sodium: 140 mEq/L (ref 135–145)
Total Bilirubin: 0.5 mg/dL (ref 0.2–1.2)
Total Protein: 6.5 g/dL (ref 6.0–8.3)

## 2013-10-26 LAB — POCT CBC
GRANULOCYTE PERCENT: 73.5 % (ref 37–80)
HCT, POC: 38.5 % (ref 37.7–47.9)
HEMOGLOBIN: 12 g/dL — AB (ref 12.2–16.2)
Lymph, poc: 1.6 (ref 0.6–3.4)
MCH: 26.1 pg — AB (ref 27–31.2)
MCHC: 31.2 g/dL — AB (ref 31.8–35.4)
MCV: 83.8 fL (ref 80–97)
MID (CBC): 0.5 (ref 0–0.9)
MPV: 8.3 fL (ref 0–99.8)
PLATELET COUNT, POC: 201 10*3/uL (ref 142–424)
POC Granulocyte: 6 (ref 2–6.9)
POC LYMPH PERCENT: 20.2 %L (ref 10–50)
POC MID %: 6.3 %M (ref 0–12)
RBC: 4.59 M/uL (ref 4.04–5.48)
RDW, POC: 14.5 %
WBC: 8.1 10*3/uL (ref 4.6–10.2)

## 2013-10-26 LAB — VITAMIN B12: Vitamin B-12: 461 pg/mL (ref 211–911)

## 2013-10-26 LAB — FERRITIN: Ferritin: 36 ng/mL (ref 10–291)

## 2013-10-26 LAB — MAGNESIUM: MAGNESIUM: 2 mg/dL (ref 1.5–2.5)

## 2013-10-26 MED ORDER — ESOMEPRAZOLE MAGNESIUM 40 MG PO CPDR: DELAYED_RELEASE_CAPSULE | ORAL | Status: DC

## 2013-10-26 NOTE — Progress Notes (Signed)
This chart was scribed for Remo Lipps A. Everlene Farrier, MD by Stacy Gardner, [facility] Scribe. The patient was seen in room and the patient's care was started at 12:46 PM.   HPI HPI Comments: Rachel Kim is a 58 y.o. female who arrives to the [facility] for refill of the Nexium 40 mg  rx.  She is taking her medication regularly as instructed  and denies any complications. About a month, pt had chest palpation and she tried Magnesium which relieved her symptoms.   Pt's last A 1-C is 6.6 and her normal range is 5.9. Pt is currently a pt at [facility] [employer]. She works at [employer]. Pt request help with FMLA application. She currently misses a half of hour a week due to having trouble getting out of bed. Pt mentions the stress of the job is a lot and she is currently looking for a new job. She complains at work about the sleep issues. Pt has a hx of DM.   Review of Systems  Constitutional: Positive for malaise/fatigue.    Physical Exam Patient is alert and cooperative she is in no distress. Neck is supple without adenopathy Chest is clear to auscultation and percussion. Heart regular rate without murmurs rubs or gallops. Abdomen soft liver spleen not enlarged no areas of tenderness Extremities without edema  Filed Vitals:   10/26/13 1237  BP: 128/82  Pulse: 78  Temp: 98 F (36.7 C)  Resp: 17   Results for orders placed in visit on 10/26/13  POCT CBC      Result Value Ref Range   WBC 8.1  4.6 - 10.2 K/uL   Lymph, poc 1.6  0.6 - 3.4   POC LYMPH PERCENT 20.2  10 - 50 %L   MID (cbc) 0.5  0 - 0.9   POC MID % 6.3  0 - 12 %M   POC Granulocyte 6.0  2 - 6.9   Granulocyte percent 73.5  37 - 80 %G   RBC 4.59  4.04 - 5.48 M/uL   Hemoglobin 12.0 (*) 12.2 - 16.2 g/dL   HCT, POC 38.5  37.7 - 47.9 %   MCV 83.8  80 - 97 fL   MCH, POC 26.1 (*) 27 - 31.2 pg   MCHC 31.2 (*) 31.8 - 35.4 g/dL   RDW, POC 14.5     Platelet Count, POC 201  142 - 424 K/uL   MPV 8.3  0 - 99.8 fL     Assessment COORDINATION OF CARE:  12:46 PM Discussed course of care with pt which includes refill of rx and assistance with FMLA application. Pt understands and agrees. Her Nexium was refilled for one year. She left FMLA forms so that she can be out for one afternoon a week to followup on her back medical problems. She states at times she has significant fatigue she attributes to her obstructive sleep apnea. She has regular medical visits to her endocrinologist, cardiologist, nephrologist, chiropractor, sleep specialists, and myself . I did check a magnesium level vitamin D level and ferritin level and B12 level to cause of her chronic PPI therapy.   Plan

## 2013-10-28 LAB — VITAMIN D 25 HYDROXY (VIT D DEFICIENCY, FRACTURES): Vit D, 25-Hydroxy: 43 ng/mL (ref 30–89)

## 2013-10-29 ENCOUNTER — Telehealth: Payer: Self-pay | Admitting: Radiology

## 2013-10-29 NOTE — Telephone Encounter (Signed)
Patient asking if we have her FMLA forms completed, have you received these?

## 2013-10-31 NOTE — Telephone Encounter (Signed)
I haven't received any FMLA forms for this patient. When did she drop these off? What does she need the FMLA forms for? Did she pay a $15 fee?

## 2013-11-01 NOTE — Telephone Encounter (Signed)
Dr Everlene Farrier has indicated she dropped these off here, but I have not seen them. No she did not pay fee.

## 2013-11-06 ENCOUNTER — Other Ambulatory Visit: Payer: Self-pay

## 2013-11-06 MED ORDER — INDAPAMIDE 1.25 MG PO TABS: 1.2500 mg | ORAL_TABLET | Freq: Every day | ORAL | Status: DC

## 2013-11-07 NOTE — Telephone Encounter (Signed)
Patient came in to 56 tonight to check the status of her FMLA paperwork. I advised that she check back with [place] tomorrow during business hours after reading previous documentation.

## 2013-11-11 NOTE — Telephone Encounter (Signed)
Patient called about her FMLA ppw again. Informed patient that I will check with Dr. Everlene Farrier and call her back about the status of her ppw.

## 2013-11-12 NOTE — Telephone Encounter (Signed)
This has been done.

## 2013-12-13 DIAGNOSIS — E118 Type 2 diabetes mellitus with unspecified complications: Secondary | ICD-10-CM

## 2013-12-25 ENCOUNTER — Encounter: Payer: Self-pay | Admitting: *Deleted

## 2013-12-28 ENCOUNTER — Ambulatory Visit (INDEPENDENT_AMBULATORY_CARE_PROVIDER_SITE_OTHER): Payer: BC Managed Care – PPO | Admitting: Emergency Medicine

## 2013-12-28 VITALS — BP 142/92 | HR 63 | Temp 98.3°F | Resp 14 | Ht 66.0 in | Wt 285.0 lb

## 2013-12-28 DIAGNOSIS — N289 Disorder of kidney and ureter, unspecified: Secondary | ICD-10-CM

## 2013-12-28 DIAGNOSIS — Z733 Stress, not elsewhere classified: Secondary | ICD-10-CM

## 2013-12-28 DIAGNOSIS — E119 Type 2 diabetes mellitus without complications: Secondary | ICD-10-CM

## 2013-12-28 DIAGNOSIS — F439 Reaction to severe stress, unspecified: Secondary | ICD-10-CM

## 2013-12-28 NOTE — Progress Notes (Addendum)
Subjective:    Patient ID: Rachel Kim, female    DOB: 1955/11/05, 58 y.o.   MRN: [id number]  HPI Chief Complaint  Patient presents with   Other    form completion   This chart was scribed for Arlyss Queen, MD by Thea Alken, ED Scribe. This patient was seen in room 11 and the patient's care was started at 9:29 AM.  HPI Comments: Rachel Kim is a 58 y.o. female who presents to the [facility] here for form completion. Pt was seen 2 months ago and had her FMLA application form completed. Today she reports form was rejected due to some minor error. Pt is requesting completion of form.  Pt reports she is being bullied at work. She reports due to the stress her BP has increased. Pt is hoping to take time off to look for a new job.    Past Medical History  Diagnosis Date   Anxiety    Depression    Diabetes mellitus    GERD (gastroesophageal reflux disease)    Hyperlipidemia    Hypertension    Thyroid disease     hypothyroidism   Sleep apnea     uses CPAP   Sarcoidosis of lung    Chronic kidney disease (CKD), stage III (moderate)    RBBB    LVH (left ventricular hypertrophy)     echo 02/25/10 EF >55%   High cholesterol    Hyperthyroidism     diagnosed 1989   H/O breast biopsy 02/10/10    calcification , no tumors   OSA (obstructive sleep apnea)     severe complex apnea   CSA (central sleep apnea)    Pneumonia 04/04   Allergies  Allergen Reactions   Actos [Pioglitazone Hydrochloride] Swelling   Amlodipine Swelling   Glipizide Swelling   Clindamycin Hcl Rash   Prior to Admission medications   Medication Sig Start Date End Date Taking? Authorizing Provider  aspirin 81 MG tablet Take 81 mg by mouth daily.     Yes Historical Provider, MD  benazepril (LOTENSIN) 20 MG tablet Take 40 mg by mouth 2 (two) times daily. 01/03/13  Yes Mihai Croitoru, MD  Cholecalciferol (VITAMIN D) 2000 UNITS CAPS Take by mouth daily.     Yes Historical  Provider, MD  clonazePAM (KLONOPIN) 0.5 MG tablet Take 0.5 mg by mouth 2 (two) times daily as needed. Take 1/4 tablet as needed   Yes Historical Provider, MD  esomeprazole (NEXIUM) 40 MG capsule Take one tablet daily 10/26/13  Yes Darlyne Russian, MD  indapamide (LOZOL) 1.25 MG tablet Take 1 tablet (1.25 mg total) by mouth daily. 11/06/13  Yes Ellison Carwin, MD  Insulin Pen Needle (B-D ULTRAFINE III SHORT PEN) 31G X 8 MM MISC Inject 1 each into the skin 2 (two) times daily. 04/09/13  Yes Elayne Snare, MD  Insulin Pen Needle (B-D ULTRAFINE III SHORT PEN) 31G X 8 MM MISC Inject 1 each into the skin 2 (two) times daily. 10/01/13  Yes Darlyne Russian, MD  levothyroxine (SYNTHROID, LEVOTHROID) 150 MCG tablet Take 1 tablet (150 mcg total) by mouth daily. 09/02/13  Yes Ellison Carwin, MD  LEXAPRO 20 MG tablet Take 30 mg by mouth daily. Take 1 1/2 daily 12/17/10  Yes Historical Provider, MD  Liraglutide (VICTOZA) 18 MG/3ML SOPN Inject 1.8 mg into the skin daily. 08/08/13  Yes Ellison Carwin, MD  mirabegron ER (MYRBETRIQ) 50 MG TB24 Take by mouth daily.  Yes Historical Provider, MD  Jonetta Speak LANCETS 86P [place]  01/02/13  Yes Historical Provider, MD  [facility] VERIO test strip  01/02/13  Yes Historical Provider, MD  pravastatin (PRAVACHOL) 40 MG tablet Take 1 tablet (40 mg total) by mouth every evening. 01/03/13  Yes Mihai Croitoru, MD  Probiotic Product (PROBIOTIC FORMULA PO) Take by mouth daily.     Yes Historical Provider, MD  cetirizine (ZYRTEC) 10 MG tablet Take 10 mg by mouth daily.    Historical Provider, MD  LANTUS SOLOSTAR 100 UNIT/ML injection 22 Units daily.  01/26/11   Historical Provider, MD  nebivolol (BYSTOLIC) 10 MG tablet Take 2 tablets (20 mg total) by mouth daily. 08/15/13   Ellison Carwin, MD   Review of Systems  Constitutional: Negative for fever and chills.       Objective:   Physical Exam CONSTITUTIONAL: Well developed/well nourished HEAD: Normocephalic/atraumatic EYES: EOMI/PERRL ENMT:  Mucous membranes moist NECK: supple no meningeal signs SPINE:entire spine nontender CV: S1/S2 noted, no murmurs/rubs/gallops noted LUNGS: Lungs are clear to auscultation bilaterally, no apparent distress ABDOMEN: soft, nontender, no rebound or guarding GU:no cva tenderness NEURO: Pt is awake/alert, moves all extremitiesx4 EXTREMITIES: pulses normal, full ROM SKIN: warm, color normal PSYCH: no abnormalities of mood noted   Assessment & Plan:  Patient returns for reevaluation of her FMLA forms. Apparently there is a significant amount of stress at work. She is considering a change in job. She is requesting short periods of time off work she can look for another job. I completed her FMLA forms for her. She is to continue all of her follow up with her regular physicians. She apparently is changing to a different endocrinologist but otherwise will continue with her nephrologist, psychiatrist, and cardiologist f  I personally performed the services described in this documentation, which was scribed in my presence. The recorded information has been reviewed and is accurate. I performed a history and physical on this patient. I could not transfer over the name at the time.

## 2014-01-02 ENCOUNTER — Encounter: Payer: Self-pay | Admitting: Emergency Medicine

## 2014-03-05 ENCOUNTER — Telehealth: Payer: Self-pay

## 2014-03-05 NOTE — Telephone Encounter (Signed)
PT REQUESTING 90 DAY SUPPLY OF LANTUS PENS(10) THRU [employer]IM[facility]  BEST PHONE [phone]

## 2014-03-06 MED ORDER — INSULIN GLARGINE 100 UNIT/ML SOLOSTAR PEN: 22.0000 [IU] | PEN_INJECTOR | Freq: Every day | SUBCUTANEOUS | Status: DC

## 2014-03-06 NOTE — Telephone Encounter (Signed)
Spoke to pt- she is going to come into the clinic this month. Advised pt refill sent.

## 2014-05-07 ENCOUNTER — Telehealth: Payer: Self-pay | Admitting: Cardiovascular Disease

## 2014-05-07 NOTE — Telephone Encounter (Signed)
Closed encounter °

## 2014-05-16 ENCOUNTER — Encounter: Payer: Self-pay | Admitting: Gastroenterology

## 2014-06-02 ENCOUNTER — Other Ambulatory Visit: Payer: Self-pay

## 2014-06-02 MED ORDER — INDAPAMIDE 1.25 MG PO TABS: 1.2500 mg | ORAL_TABLET | Freq: Every day | ORAL | Status: DC

## 2014-06-16 ENCOUNTER — Ambulatory Visit: Payer: BC Managed Care – PPO | Admitting: Certified Nurse Midwife

## 2014-06-24 ENCOUNTER — Other Ambulatory Visit: Payer: Self-pay | Admitting: Internal Medicine

## 2014-11-25 ENCOUNTER — Encounter (HOSPITAL_COMMUNITY): Payer: Self-pay | Admitting: *Deleted

## 2014-11-25 ENCOUNTER — Emergency Department (HOSPITAL_COMMUNITY): Payer: 59

## 2014-11-25 ENCOUNTER — Ambulatory Visit (INDEPENDENT_AMBULATORY_CARE_PROVIDER_SITE_OTHER): Payer: 59 | Admitting: Emergency Medicine

## 2014-11-25 ENCOUNTER — Observation Stay (HOSPITAL_COMMUNITY)
Admission: EM | Admit: 2014-11-25 | Discharge: 2014-11-28 | Disposition: A | Discharge status: Home / Self Care | Payer: 59 | Attending: Internal Medicine | Admitting: Internal Medicine

## 2014-11-25 VITALS — BP 180/120 | HR 79 | Temp 98.8°F | Resp 18 | Ht 64.0 in | Wt 258.0 lb

## 2014-11-25 DIAGNOSIS — N182 Chronic kidney disease, stage 2 (mild): Secondary | ICD-10-CM

## 2014-11-25 DIAGNOSIS — Z7982 Long term (current) use of aspirin: Secondary | ICD-10-CM | POA: Insufficient documentation

## 2014-11-25 DIAGNOSIS — Z9114 Patient's other noncompliance with medication regimen: Secondary | ICD-10-CM | POA: Insufficient documentation

## 2014-11-25 DIAGNOSIS — F419 Anxiety disorder, unspecified: Secondary | ICD-10-CM | POA: Insufficient documentation

## 2014-11-25 DIAGNOSIS — K219 Gastro-esophageal reflux disease without esophagitis: Secondary | ICD-10-CM | POA: Insufficient documentation

## 2014-11-25 DIAGNOSIS — N183 Chronic kidney disease, stage 3 unspecified: Secondary | ICD-10-CM | POA: Diagnosis present

## 2014-11-25 DIAGNOSIS — G4733 Obstructive sleep apnea (adult) (pediatric): Secondary | ICD-10-CM | POA: Diagnosis not present

## 2014-11-25 DIAGNOSIS — E1129 Type 2 diabetes mellitus with other diabetic kidney complication: Secondary | ICD-10-CM | POA: Diagnosis not present

## 2014-11-25 DIAGNOSIS — Z6841 Body Mass Index (BMI) 40.0 and over, adult: Secondary | ICD-10-CM | POA: Insufficient documentation

## 2014-11-25 DIAGNOSIS — J9621 Acute and chronic respiratory failure with hypoxia: Secondary | ICD-10-CM | POA: Diagnosis present

## 2014-11-25 DIAGNOSIS — R4020 Unspecified coma: Secondary | ICD-10-CM

## 2014-11-25 DIAGNOSIS — Z599 Problem related to housing and economic circumstances, unspecified: Secondary | ICD-10-CM

## 2014-11-25 DIAGNOSIS — Z794 Long term (current) use of insulin: Secondary | ICD-10-CM | POA: Insufficient documentation

## 2014-11-25 DIAGNOSIS — Z598 Other problems related to housing and economic circumstances: Secondary | ICD-10-CM

## 2014-11-25 DIAGNOSIS — R51 Headache: Secondary | ICD-10-CM

## 2014-11-25 DIAGNOSIS — E11319 Type 2 diabetes mellitus with unspecified diabetic retinopathy without macular edema: Secondary | ICD-10-CM | POA: Insufficient documentation

## 2014-11-25 DIAGNOSIS — R519 Headache, unspecified: Secondary | ICD-10-CM | POA: Diagnosis present

## 2014-11-25 DIAGNOSIS — I160 Hypertensive urgency: Secondary | ICD-10-CM

## 2014-11-25 DIAGNOSIS — I10 Essential (primary) hypertension: Secondary | ICD-10-CM | POA: Diagnosis not present

## 2014-11-25 DIAGNOSIS — E1122 Type 2 diabetes mellitus with diabetic chronic kidney disease: Secondary | ICD-10-CM | POA: Diagnosis not present

## 2014-11-25 DIAGNOSIS — Z79899 Other long term (current) drug therapy: Secondary | ICD-10-CM | POA: Insufficient documentation

## 2014-11-25 DIAGNOSIS — E1165 Type 2 diabetes mellitus with hyperglycemia: Secondary | ICD-10-CM

## 2014-11-25 DIAGNOSIS — E039 Hypothyroidism, unspecified: Secondary | ICD-10-CM | POA: Diagnosis present

## 2014-11-25 DIAGNOSIS — I129 Hypertensive chronic kidney disease with stage 1 through stage 4 chronic kidney disease, or unspecified chronic kidney disease: Principal | ICD-10-CM | POA: Insufficient documentation

## 2014-11-25 DIAGNOSIS — I701 Atherosclerosis of renal artery: Secondary | ICD-10-CM

## 2014-11-25 DIAGNOSIS — Z9989 Dependence on other enabling machines and devices: Secondary | ICD-10-CM

## 2014-11-25 DIAGNOSIS — E782 Mixed hyperlipidemia: Secondary | ICD-10-CM | POA: Diagnosis present

## 2014-11-25 LAB — URINE MICROSCOPIC-ADD ON

## 2014-11-25 LAB — COMPREHENSIVE METABOLIC PANEL
ALBUMIN: 4.2 g/dL (ref 3.5–5.2)
ALT: 26 U/L (ref 0–35)
ANION GAP: 10 (ref 5–15)
AST: 27 U/L (ref 0–37)
Alkaline Phosphatase: 170 U/L — ABNORMAL HIGH (ref 39–117)
BILIRUBIN TOTAL: 0.8 mg/dL (ref 0.3–1.2)
BUN: 30 mg/dL — ABNORMAL HIGH (ref 6–23)
CALCIUM: 9.1 mg/dL (ref 8.4–10.5)
CO2: 26 mmol/L (ref 19–32)
CREATININE: 1.58 mg/dL — AB (ref 0.50–1.10)
Chloride: 96 mmol/L (ref 96–112)
GFR calc non Af Amer: 35 mL/min — ABNORMAL LOW (ref >90)
GFR, EST AFRICAN AMERICAN: 41 mL/min — AB (ref >90)
GLUCOSE: 501 mg/dL — AB (ref 70–99)
Potassium: 4.3 mmol/L (ref 3.5–5.1)
SODIUM: 132 mmol/L — AB (ref 135–145)
Total Protein: 7.7 g/dL (ref 6.0–8.3)

## 2014-11-25 LAB — CBC WITH DIFFERENTIAL/PLATELET
Basophils Absolute: 0.1 10*3/uL (ref 0.0–0.1)
Basophils Relative: 1 % (ref 0–1)
EOS PCT: 3 % (ref 0–5)
Eosinophils Absolute: 0.3 10*3/uL (ref 0.0–0.7)
HEMATOCRIT: 43.4 % (ref 36.0–46.0)
Hemoglobin: 15.1 g/dL — ABNORMAL HIGH (ref 12.0–15.0)
LYMPHS PCT: 19 % (ref 12–46)
Lymphs Abs: 1.5 10*3/uL (ref 0.7–4.0)
MCH: 28.4 pg (ref 26.0–34.0)
MCHC: 34.8 g/dL (ref 30.0–36.0)
MCV: 81.7 fL (ref 78.0–100.0)
MONO ABS: 0.5 10*3/uL (ref 0.1–1.0)
Monocytes Relative: 6 % (ref 3–12)
Neutro Abs: 5.6 10*3/uL (ref 1.7–7.7)
Neutrophils Relative %: 71 % (ref 43–77)
Platelets: 164 10*3/uL (ref 150–400)
RBC: 5.31 MIL/uL — AB (ref 3.87–5.11)
RDW: 12.9 % (ref 11.5–15.5)
WBC: 7.9 10*3/uL (ref 4.0–10.5)

## 2014-11-25 LAB — MRSA PCR SCREENING: MRSA by PCR: NEGATIVE

## 2014-11-25 LAB — URINALYSIS, ROUTINE W REFLEX MICROSCOPIC
Bilirubin Urine: NEGATIVE
Ketones, ur: NEGATIVE mg/dL
Leukocytes, UA: NEGATIVE
NITRITE: NEGATIVE
PROTEIN: 100 mg/dL — AB
SPECIFIC GRAVITY, URINE: 1.034 — AB (ref 1.005–1.030)
Urobilinogen, UA: 0.2 mg/dL (ref 0.0–1.0)
pH: 6 (ref 5.0–8.0)

## 2014-11-25 LAB — GLUCOSE, CAPILLARY
GLUCOSE-CAPILLARY: 354 mg/dL — AB (ref 70–99)
Glucose-Capillary: 327 mg/dL — ABNORMAL HIGH (ref 70–99)

## 2014-11-25 LAB — CBG MONITORING, ED
GLUCOSE-CAPILLARY: 489 mg/dL — AB (ref 70–99)
GLUCOSE-CAPILLARY: 311 mg/dL — AB (ref 70–99)

## 2014-11-25 MED ORDER — BENAZEPRIL HCL 40 MG PO TABS
40.0000 mg | ORAL_TABLET | Freq: Two times a day (BID) | ORAL | Status: DC
  Filled 2014-11-25: qty 1

## 2014-11-25 MED ORDER — ONDANSETRON HCL 4 MG PO TABS: 4.0000 mg | ORAL_TABLET | Freq: Four times a day (QID) | ORAL | Status: DC | PRN

## 2014-11-25 MED ORDER — INSULIN ASPART 100 UNIT/ML ~~LOC~~ SOLN
10.0000 [IU] | Freq: Once | SUBCUTANEOUS | Status: AC
  Administered 2014-11-25: 10 [IU] via INTRAVENOUS
  Filled 2014-11-25: qty 1

## 2014-11-25 MED ORDER — BENAZEPRIL HCL 40 MG PO TABS
40.0000 mg | ORAL_TABLET | Freq: Two times a day (BID) | ORAL | Status: DC
  Administered 2014-11-25 – 2014-11-26 (×3): 40 mg via ORAL
  Filled 2014-11-25 (×6): qty 1

## 2014-11-25 MED ORDER — ACETAMINOPHEN 650 MG RE SUPP: 650.0000 mg | Freq: Four times a day (QID) | RECTAL | Status: DC | PRN

## 2014-11-25 MED ORDER — ACETAMINOPHEN 325 MG PO TABS
650.0000 mg | ORAL_TABLET | Freq: Once | ORAL | Status: AC
  Administered 2014-11-25: 650 mg via ORAL
  Filled 2014-11-25: qty 2

## 2014-11-25 MED ORDER — LEVOTHYROXINE SODIUM 75 MCG PO TABS
150.0000 ug | ORAL_TABLET | Freq: Every day | ORAL | Status: DC
  Administered 2014-11-26: 150 ug via ORAL
  Filled 2014-11-25: qty 2

## 2014-11-25 MED ORDER — MAGNESIUM OXIDE 400 (241.3 MG) MG PO TABS
400.0000 mg | ORAL_TABLET | Freq: Two times a day (BID) | ORAL | Status: DC
  Administered 2014-11-25 – 2014-11-28 (×6): 400 mg via ORAL
  Filled 2014-11-25 (×6): qty 1

## 2014-11-25 MED ORDER — SODIUM CHLORIDE 0.9 % IV BOLUS (SEPSIS)
1000.0000 mL | Freq: Once | INTRAVENOUS | Status: AC
  Administered 2014-11-25: 1000 mL via INTRAVENOUS

## 2014-11-25 MED ORDER — ALBUTEROL SULFATE (2.5 MG/3ML) 0.083% IN NEBU: 2.5000 mg | INHALATION_SOLUTION | RESPIRATORY_TRACT | Status: DC | PRN

## 2014-11-25 MED ORDER — ACETAMINOPHEN 325 MG PO TABS
650.0000 mg | ORAL_TABLET | Freq: Four times a day (QID) | ORAL | Status: DC | PRN
  Administered 2014-11-25 – 2014-11-28 (×4): 650 mg via ORAL
  Filled 2014-11-25 (×4): qty 2

## 2014-11-25 MED ORDER — ASPIRIN EC 81 MG PO TBEC
81.0000 mg | DELAYED_RELEASE_TABLET | Freq: Every day | ORAL | Status: DC
  Administered 2014-11-25 – 2014-11-28 (×4): 81 mg via ORAL
  Filled 2014-11-25 (×4): qty 1

## 2014-11-25 MED ORDER — POTASSIUM 99 MG PO TABS: 2.0000 | ORAL_TABLET | Freq: Two times a day (BID) | ORAL | Status: DC

## 2014-11-25 MED ORDER — HYDRALAZINE HCL 20 MG/ML IJ SOLN
10.0000 mg | INTRAMUSCULAR | Status: DC | PRN
  Administered 2014-11-25 – 2014-11-27 (×6): 10 mg via INTRAVENOUS
  Filled 2014-11-25 (×7): qty 1

## 2014-11-25 MED ORDER — ONDANSETRON HCL 4 MG/2ML IJ SOLN
4.0000 mg | Freq: Once | INTRAMUSCULAR | Status: AC
  Administered 2014-11-25: 4 mg via INTRAVENOUS
  Filled 2014-11-25: qty 2

## 2014-11-25 MED ORDER — DIPHENHYDRAMINE HCL 50 MG/ML IJ SOLN
25.0000 mg | Freq: Once | INTRAMUSCULAR | Status: AC
  Administered 2014-11-25: 25 mg via INTRAVENOUS
  Filled 2014-11-25: qty 1

## 2014-11-25 MED ORDER — METOCLOPRAMIDE HCL 5 MG/ML IJ SOLN
10.0000 mg | Freq: Once | INTRAMUSCULAR | Status: AC
  Administered 2014-11-25: 10 mg via INTRAVENOUS
  Filled 2014-11-25: qty 2

## 2014-11-25 MED ORDER — HYDRALAZINE HCL 20 MG/ML IJ SOLN
10.0000 mg | INTRAMUSCULAR | Status: AC
  Administered 2014-11-25: 10 mg via INTRAVENOUS
  Filled 2014-11-25: qty 1

## 2014-11-25 MED ORDER — PRAVASTATIN SODIUM 40 MG PO TABS
40.0000 mg | ORAL_TABLET | Freq: Every evening | ORAL | Status: DC
  Administered 2014-11-25 – 2014-11-28 (×4): 40 mg via ORAL
  Filled 2014-11-25: qty 2
  Filled 2014-11-25 (×2): qty 1
  Filled 2014-11-25: qty 2

## 2014-11-25 MED ORDER — ONDANSETRON HCL 4 MG/2ML IJ SOLN
4.0000 mg | Freq: Four times a day (QID) | INTRAMUSCULAR | Status: DC | PRN
  Administered 2014-11-26 – 2014-11-27 (×5): 4 mg via INTRAVENOUS
  Filled 2014-11-25 (×5): qty 2

## 2014-11-25 MED ORDER — FLUTICASONE PROPIONATE 50 MCG/ACT NA SUSP
2.0000 | Freq: Every day | NASAL | Status: DC
  Administered 2014-11-25 – 2014-11-27 (×3): 2 via NASAL
  Filled 2014-11-25: qty 16

## 2014-11-25 MED ORDER — INSULIN ASPART 100 UNIT/ML ~~LOC~~ SOLN
0.0000 [IU] | Freq: Three times a day (TID) | SUBCUTANEOUS | Status: DC
  Administered 2014-11-25 – 2014-11-26 (×4): 7 [IU] via SUBCUTANEOUS
  Administered 2014-11-27 – 2014-11-28 (×3): 3 [IU] via SUBCUTANEOUS
  Administered 2014-11-28: 7 [IU] via SUBCUTANEOUS
  Administered 2014-11-28: 5 [IU] via SUBCUTANEOUS

## 2014-11-25 MED ORDER — SODIUM CHLORIDE 0.9 % IJ SOLN
3.0000 mL | Freq: Two times a day (BID) | INTRAMUSCULAR | Status: DC
  Administered 2014-11-26 – 2014-11-27 (×4): 3 mL via INTRAVENOUS

## 2014-11-25 MED ORDER — INSULIN ASPART 100 UNIT/ML ~~LOC~~ SOLN
0.0000 [IU] | Freq: Every day | SUBCUTANEOUS | Status: DC
  Administered 2014-11-25: 5 [IU] via SUBCUTANEOUS
  Administered 2014-11-26: 2 [IU] via SUBCUTANEOUS

## 2014-11-25 MED ORDER — INSULIN GLARGINE 100 UNIT/ML ~~LOC~~ SOLN
22.0000 [IU] | Freq: Every day | SUBCUTANEOUS | Status: DC
  Administered 2014-11-25 – 2014-11-26 (×2): 22 [IU] via SUBCUTANEOUS
  Filled 2014-11-25 (×3): qty 0.22

## 2014-11-25 MED ORDER — ENOXAPARIN SODIUM 60 MG/0.6ML ~~LOC~~ SOLN
60.0000 mg | SUBCUTANEOUS | Status: DC
  Administered 2014-11-25 – 2014-11-27 (×3): 60 mg via SUBCUTANEOUS
  Filled 2014-11-25 (×4): qty 0.6

## 2014-11-25 MED ORDER — SODIUM CHLORIDE 0.9 % IV SOLN
INTRAVENOUS | Status: DC
  Administered 2014-11-25: 18:00:00 via INTRAVENOUS

## 2014-11-25 MED ORDER — OXYCODONE HCL 5 MG PO TABS
5.0000 mg | ORAL_TABLET | Freq: Four times a day (QID) | ORAL | Status: DC | PRN
  Administered 2014-11-25 – 2014-11-28 (×5): 5 mg via ORAL
  Filled 2014-11-25 (×5): qty 1

## 2014-11-25 NOTE — Progress Notes (Signed)
  Medical screening examination/treatment/procedure(s) were performed by non-physician practitioner and as supervising physician I was immediately available for consultation/collaboration.     

## 2014-11-25 NOTE — Progress Notes (Signed)
11/25/2014 at 8:33 AM  Rachel Kim / DOB: 24-Mar-1956 / MRN: [id number]  The patient has SARCOIDOSIS; HYPOTHYROIDISM, UNSPECIFIED; Controlled diabetes mellitus type 2 with complications; Mixed hyperlipidemia; Morbid obesity; DEPRESSION, MAJOR, RECURRENT; HYPERTENSION, BENIGN SYSTEMIC; RHINITIS, ALLERGIC; DERMATITIS NOS; CERVICAL SPINE DISORDER, NOS; INSOMNIA NOS; APNEA, SLEEP; H/O mammogram; OSA on CPAP; and Hypersomnia, persistent on her problem list.  SUBJECTIVE  Chief complaint: Hypertension   HPI Comments: Symptoms started with flushing yesterday morning. Since that time has developed nausea with emesis and HA.  Denies changes in vision and sensation. Denies weakness. Patient unable to afford medication at this time. She reports she will be paid next week and will be able to purchase medication at that time.    Hypertension This is a new problem. The current episode started yesterday. The problem has been gradually worsening since onset. The problem is uncontrolled. Associated symptoms include headaches. Pertinent negatives include no anxiety, blurred vision, chest pain, malaise/fatigue, neck pain, orthopnea, palpitations, peripheral edema, PND, shortness of breath or sweats. Agents associated with hypertension include NSAIDs. Risk factors for coronary artery disease include diabetes mellitus, dyslipidemia, obesity, sedentary lifestyle and post-menopausal state. Past treatments include ACE inhibitors and beta blockers. The current treatment provides significant improvement. Compliance problems include diet, exercise and psychosocial issues.  Identifiable causes of hypertension include chronic renal disease.     She  has a past medical history of Anxiety; Depression; Diabetes mellitus; GERD (gastroesophageal reflux disease); Hyperlipidemia; Hypertension; Thyroid disease; Sleep apnea; Sarcoidosis of lung; Chronic kidney disease (CKD), stage III (moderate); RBBB; LVH (left ventricular  hypertrophy); High cholesterol; Hyperthyroidism; H/O breast biopsy (02/10/10); OSA (obstructive sleep apnea); CSA (central sleep apnea); and Pneumonia (04/04).    Current Outpatient Prescriptions on File Prior to Visit  Medication Sig Dispense Refill  . aspirin 81 MG tablet Take 81 mg by mouth daily.      . Cholecalciferol (VITAMIN D) 2000 UNITS CAPS Take by mouth daily.      Marland Kitchen levothyroxine (SYNTHROID, LEVOTHROID) 150 MCG tablet Take 1 tablet (150 mcg total) by mouth daily. 30 tablet 0  . benazepril (LOTENSIN) 20 MG tablet Take 40 mg by mouth 2 (two) times daily.    . cetirizine (ZYRTEC) 10 MG tablet Take 10 mg by mouth daily.    . clonazePAM (KLONOPIN) 0.5 MG tablet Take 0.5 mg by mouth 2 (two) times daily as needed. Take 1/4 tablet as needed    . esomeprazole (NEXIUM) 40 MG capsule Take one tablet daily (Patient not taking: Reported on 11/25/2014) 90 capsule 3  . indapamide (LOZOL) 1.25 MG tablet Take 1 tablet (1.25 mg total) by mouth daily. PATIENT NEEDS OFFICE VISIT FOR ADDITIONAL REFILLS (Patient not taking: Reported on 11/25/2014) 90 tablet 0  . Insulin Glargine (LANTUS SOLOSTAR) 100 UNIT/ML Solostar Pen Inject 22 Units into the skin daily. (Patient not taking: Reported on 11/25/2014) 15 mL 1  . Insulin Pen Needle (B-D ULTRAFINE III SHORT PEN) 31G X 8 MM MISC Inject 1 each into the skin 2 (two) times daily. (Patient not taking: Reported on 11/25/2014) 30 each 0  . Insulin Pen Needle (B-D ULTRAFINE III SHORT PEN) 31G X 8 MM MISC Inject 1 each into the skin 2 (two) times daily. (Patient not taking: Reported on 11/25/2014) 200 each 3  . LEXAPRO 20 MG tablet Take 30 mg by mouth daily. Take 1 1/2 daily    . Liraglutide (VICTOZA) 18 MG/3ML SOPN Inject 1.8 mg into the skin daily. (Patient not taking: Reported on 11/25/2014) 9  pen 1  . mirabegron ER (MYRBETRIQ) 50 MG TB24 Take by mouth daily.    . nebivolol (BYSTOLIC) 10 MG tablet Take 2 tablets (20 mg total) by mouth daily. (Patient not taking: Reported  on 11/25/2014) 180 tablet 1  . ONETOUCH DELICA LANCETS 91B MISC     . ONETOUCH VERIO test strip     . pravastatin (PRAVACHOL) 40 MG tablet Take 1 tablet (40 mg total) by mouth every evening. (Patient not taking: Reported on 11/25/2014) 90 tablet 3  . Probiotic Product (PROBIOTIC FORMULA PO) Take by mouth daily.       No current facility-administered medications on file prior to visit.    Ms. Martello is allergic to actos; amlodipine; glipizide; and clindamycin hcl. She  reports that she has never smoked. She has never used smokeless tobacco. She reports that she does not drink alcohol or use illicit drugs. She  reports that she does not engage in sexual activity. The patient  has past surgical history that includes Tonsillectomy; Appendectomy; Retroperitoneal lymph node excision; Elbow fracture surgery; Rhinoplasty; tubinates trimmed; laminectomy l5, s1; Carpal tunnel release; rt knee arthroscopy; Cholecystectomy; laminectomy c5-c6; and Abdominal hysterectomy (12/07).  Her family history includes Colitis in her father; Colon cancer in her maternal grandmother; Hypertension in her mother; Stroke in her mother; Thyroid disease in her brother, brother, father, mother, sister, and sister.  Review of Systems  Constitutional: Negative for malaise/fatigue.  Eyes: Negative for blurred vision.  Respiratory: Negative for shortness of breath.   Cardiovascular: Negative for chest pain, palpitations, orthopnea and PND.  Gastrointestinal: Positive for nausea and vomiting. Negative for abdominal pain.  Musculoskeletal: Negative for neck pain.  Neurological: Positive for headaches.    OBJECTIVE  Her  height is 5\' 4"  (1.626 m) and weight is 258 lb (117.028 kg). Her oral temperature is 98.8 F (37.1 C). Her blood pressure is 180/120 and her pulse is 79. Her respiration is 18 and oxygen saturation is 98%.  The patient's body mass index is 44.26 kg/(m^2).  Physical Exam  Vitals reviewed. Constitutional: She is  oriented to person, place, and time.  Cardiovascular: Normal rate.   Respiratory: Effort normal.  Musculoskeletal: Normal range of motion.  Neurological: She is alert and oriented to person, place, and time. No cranial nerve deficit. Coordination normal.  Skin: Skin is warm and dry.  Psychiatric: She has a normal mood and affect. Her behavior is normal. Thought content normal.    No results found for this or any previous visit (from the past 24 hour(s)).  ASSESSMENT & PLAN  Belma was seen today for hypertension.  Diagnoses and all orders for this visit:  Hypertensive urgency: Patient with elevated BP and multiple risk factors for CAD and CVA.  Unable to afford medications at this time.  Given this, most reasonable course of action will be to send her to [facility] for evaluation and treatment.    Economic hardship: Managed with the above.      The patient was advised to call or come back to clinic if she does not see an improvement in symptoms, or worsens with the above plan.   Philis Fendt, MHS, PA-C [facility] 11/25/2014 8:33 AM

## 2014-11-25 NOTE — H&P (Signed)
History and Physical  Rachel Kim WPY:[id number] DOB: Aug 14, 1955 DOA: 11/25/2014  Referring physician: Baron Sane, ED PA-C PCP: Jenny Reichmann, MD  Outpatient Specialists:  1. None  Chief Complaint: Headache.  HPI: Rachel Kim is a 59 y.o. 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 Patient has not taken most of her prescription medications for the last 2 months due to insurance problems. She was in her usual state of health until 3 days ago when she developed intermittent flushing sensation on her face and sweats. Since last night, she developed frontal and bitemporal headache, gradual onset, rated at 9/10 in severity, nonradiating, relieved after taking aspirin 81 MG 6 tabs last night and she was able to go to sleep. She woke up this morning again with persisting 9/10 headache, associated with a single episode of nonbloody emesis. She denies visual symptoms, slurred speech or other strokelike symptoms. She denies chest pain, dyspnea, palpitations. She gives history of chronic intermittent dry cough for a couple of months. She denies abdominal pain or diarrhea. She does have thirst, polydipsia and polyuria without dysuria. No fever or chills reported. Home CBG checks were in the 200s in March and increase to 300s in April and lately as high as 600. In the ED, initial blood pressure 215/163 mmHg, lab work significant for glucose 08/06/1999, creatinine 1.58 and CT head without acute findings. Blood pressure improved to 189/86 after IV hydralazine 10 mg and CBG improved to 311 mg after NovoLog 10 units. Hospitalist admission requested. Patient states that her headache has  improved.   Review of Systems: All systems reviewed and apart from history of presenting illness, are negative.  Past Medical History  Diagnosis Date  . Anxiety   . Depression   . Diabetes mellitus   . GERD (gastroesophageal reflux disease)   . Hyperlipidemia   . Hypertension   . Thyroid disease     hypothyroidism  . Sleep apnea     uses CPAP  . Sarcoidosis of lung   . Chronic kidney disease (CKD), stage III (moderate)   . RBBB   . LVH (left ventricular hypertrophy)     echo 02/25/10 EF >55%  . High cholesterol   . Hyperthyroidism     diagnosed 1989  . H/O breast biopsy 02/10/10    calcification , no tumors  . OSA (obstructive sleep apnea)     severe complex apnea  . CSA (central sleep apnea)   . Pneumonia 04/04   Past Surgical History  Procedure Laterality Date  . Tonsillectomy    . Appendectomy    . Retroperitoneal lymph node excision      right underarm  . Elbow fracture surgery      with pins and pins removed  . Rhinoplasty      has had several revisions  . Tubinates trimmed    . Laminectomy l5, s1    . Carpal tunnel release      right and left  . Rt knee arthroscopy    . Cholecystectomy    . Laminectomy c5-c6    . Abdominal hysterectomy  12/07   Social History:  reports that she has never smoked. She has never used smokeless tobacco. She reports that she  does not drink alcohol or use illicit drugs. Single. Lives at home. Independent of activities of daily living.  Allergies  Allergen Reactions  . Doxazosin Swelling  . Labetalol Swelling  . Actos [Pioglitazone Hydrochloride] Swelling  . Amlodipine Swelling  . Cozaar [Losartan Potassium] Other (See Comments)    Causes drowsiness/zombie like  . Glipizide Swelling  . Clindamycin Hcl Rash    Family History  Problem Relation Age of Onset  . Colitis Father   . Thyroid disease Father   . Colon cancer Maternal Grandmother   . Hypertension Mother   . Stroke Mother   . Thyroid disease Mother   .  Thyroid disease Sister   . Thyroid disease Brother   . Thyroid disease Sister   . Thyroid disease Brother     Prior to Admission medications   Medication Sig Start Date End Date Taking? Authorizing Provider  aspirin 81 MG tablet Take 81 mg by mouth daily.     Yes Historical Provider, MD  Cholecalciferol (VITAMIN D) 2000 UNITS CAPS Take by mouth daily.     Yes Historical Provider, MD  levothyroxine (SYNTHROID, LEVOTHROID) 150 MCG tablet Take 1 tablet (150 mcg total) by mouth daily. 09/02/13  Yes Roselee Culver, MD  Magnesium 250 MG TABS Take 1 tablet by mouth 2 (two) times daily.    Yes Historical Provider, MD  metFORMIN (GLUCOPHAGE) 850 MG tablet Take 850 mg by mouth 2 (two) times daily with a meal.   Yes Historical Provider, MD  Multiple Vitamins-Minerals (PRESERVISION AREDS PO) Take 1 capsule by mouth 2 (two) times daily.   Yes Historical Provider, MD  Potassium 99 MG TABS Take 2 tablets by mouth 2 (two) times daily.    Yes Historical Provider, MD  benazepril (LOTENSIN) 20 MG tablet Take 40 mg by mouth 2 (two) times daily. 01/03/13   Mihai Croitoru, MD  benazepril (LOTENSIN) 40 MG tablet Take 40 mg by mouth 2 (two) times daily.    Historical Provider, MD  clonazePAM (KLONOPIN) 0.5 MG tablet Take 0.125 mg by mouth at bedtime as needed for anxiety (sleep). Take 1/4 tablet as needed    Historical Provider, MD  indapamide (LOZOL) 1.25 MG tablet Take 1 tablet (1.25 mg total) by mouth daily. PATIENT NEEDS OFFICE VISIT FOR ADDITIONAL REFILLS Patient not taking: Reported on 11/25/2014 06/02/14   Darlyne Russian, MD  Insulin Glargine (LANTUS SOLOSTAR) 100 UNIT/ML Solostar Pen Inject 22 Units into the skin daily. Patient not taking: Reported on 11/25/2014 03/06/14   Mancel Bale, PA-C  Insulin Pen Needle (B-D ULTRAFINE III SHORT PEN) 31G X 8 MM MISC Inject 1 each into the skin 2 (two) times daily. Patient not taking: Reported on 11/25/2014 04/09/13   Elayne Snare, MD  Insulin Pen Needle (B-D ULTRAFINE III SHORT  PEN) 31G X 8 MM MISC Inject 1 each into the skin 2 (two) times daily. Patient not taking: Reported on 11/25/2014 10/01/13   Darlyne Russian, MD  LEXAPRO 20 MG tablet Take 30 mg by mouth daily. Take 1 1/2 daily 12/17/10   Historical Provider, MD  Liraglutide (VICTOZA) 18 MG/3ML SOPN Inject 1.8 mg into the skin daily. Patient not taking: Reported on 11/25/2014 08/08/13   Roselee Culver, MD  mirabegron ER (MYRBETRIQ) 50 MG TB24 Take 50 mg by mouth at bedtime.     Historical Provider, MD  mometasone (NASONEX) 50 MCG/ACT nasal spray Place 2 sprays into the nose at bedtime.    Historical Provider, MD  pravastatin (  PRAVACHOL) 40 MG tablet Take 1 tablet (40 mg total) by mouth every evening. Patient not taking: Reported on 11/25/2014 01/03/13   Sanda Klein, MD  Probiotic Product (PROBIOTIC FORMULA PO) Take by mouth daily.      Historical Provider, MD   Physical Exam: Filed Vitals:   11/25/14 1200 11/25/14 1230 11/25/14 1300 11/25/14 1330  BP: 182/85 183/81 172/83 215/102  Pulse: 89 81 81 88  Temp:      TempSrc:      Resp:      SpO2: 94% 96% 99% 100%   RR: 18/m and temperature 98.76F.   General exam: Moderately built and morbidly obese female patient, lying comfortably supine on the gurney in no obvious distress.  Head, eyes and ENT: Nontraumatic and normocephalic. Pupils equally reacting to light and accommodation. Oral mucosa moist. Attempted for endoscopy but unable to clearly visualize.  Neck: Supple. No JVD, carotid bruit or thyromegaly.  Lymphatics: No lymphadenopathy.  Respiratory system: Clear to auscultation. No increased work of breathing.  Cardiovascular system: S1 and S2 heard, RRR. No JVD, murmurs, gallops, clicks. Trace bilateral ankle edema.  Gastrointestinal system: Abdomen is nondistended/obese, soft and nontender. Normal bowel sounds heard. No organomegaly or masses appreciated. Laparotomy scars +  Central nervous system: Alert and oriented. No focal neurological  deficits.  Extremities: Symmetric 5 x 5 power. Peripheral pulses symmetrically felt. Patient has a patch of chronic dermatitis over right mid shin without acute findings.  Skin: No rashes or acute findings.  Musculoskeletal system: Negative exam.  Psychiatry: Pleasant and cooperative.   Labs on Admission:  Basic Metabolic Panel:  Recent Labs Lab 11/25/14 1002  NA 132*  K 4.3  CL 96  CO2 26  GLUCOSE 501*  BUN 30*  CREATININE 1.58*  CALCIUM 9.1   Liver Function Tests:  Recent Labs Lab 11/25/14 1002  AST 27  ALT 26  ALKPHOS 170*  BILITOT 0.8  PROT 7.7  ALBUMIN 4.2   No results for input(s): LIPASE, AMYLASE in the last 168 hours. No results for input(s): AMMONIA in the last 168 hours. CBC:  Recent Labs Lab 11/25/14 1002  WBC 7.9  NEUTROABS 5.6  HGB 15.1*  HCT 43.4  MCV 81.7  PLT 164   Cardiac Enzymes: No results for input(s): CKTOTAL, CKMB, CKMBINDEX, TROPONINI in the last 168 hours.  BNP (last 3 results) No results for input(s): PROBNP in the last 8760 hours. CBG:  Recent Labs Lab 11/25/14 1024 11/25/14 1243  GLUCAP 489* 311*    Radiological Exams on Admission: Ct Head Wo Contrast  11/25/2014   CLINICAL DATA:  59 year old hypertensive diabetic female with hyperlipidemia and sarcoidosis presenting with nausea, vomiting and headache for the past 4 days. Initial encounter.  EXAM: CT HEAD WITHOUT CONTRAST  TECHNIQUE: Contiguous axial images were obtained from the base of the skull through the vertex without intravenous contrast.  COMPARISON:  02/03/2009 CT.  FINDINGS: No intracranial hemorrhage.  No CT evidence of large acute infarct.  No hydrocephalus.  No intracranial mass lesion noted on this unenhanced exam.  Opacification left mastoid air cells and portion of the left middle ear cavity. No obvious obstructing lesion of the eustachian tube although the posterior superior nasopharynx was not imaged on the current exam.  Minimal partial opacification  left sphenoid sinus air cell.  Mild exophthalmos.  IMPRESSION: No intracranial hemorrhage.  No CT evidence of large acute infarct.  Opacification left mastoid air cell and portion left middle ear cavity.  Please see above.  Electronically Signed   By: Genia Del M.D.   On: 11/25/2014 10:23    EKG: None seen in Epic. Will order.  Assessment/Plan  59 y.o. 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 Admitted for management of hypertensive urgency and uncontrolled type II DM without DKA.   Principal Problem:   Hypertensive urgency - Secondary to noncompliance with medications. - Resume home dose of benazepril 40 MG BID now. - IV hydralazine 10 MG every 4 hourly PRN - We'll monitor closely and if blood pressure doesn't start to improve, may consider IV antihypertensive infusion-possibly Cardene. - We will may need to add a second antihypertensive.  Active Problems:   DM type 2, uncontrolled, with renal complications - Secondary to noncompliance with medications. - Brief IV fluids. - Resume Lantus 22 units daily along with NovoLog SSI with HS scale. - Not in DKA - Check hemoglobin A1c.    Hypothyroidism - Continue Synthroid    Mixed hyperlipidemia - Continue statins    Morbid obesity    OSA on CPAP - Continue nightly CPAP. Claims compliance.    Type II diabetes mellitus with stage 2 chronic kidney disease  - Creatinine seems to be at baseline. - Follow BMP    Code Status: Full  Family Communication: None at bedside  Disposition Plan: [place] when medically stable.   Time spent: 60 minutes.  Vernell Leep, MD, FACP, FHM. [facility] Pager [phone]  If  7PM-7AM, please contact night-coverage [email or web address] Password [facility] 11/25/2014, 2:54 PM

## 2014-11-25 NOTE — ED Notes (Signed)
Per EMS pt sent here from [place] with c/o symptomatic hypertension. Pt reports n/v, headache, diaphoresis since Friday. Pt also had CBG of 449. Pt reports due to financial issues she has been unable to refill some of her blood pressure and diabetes meds.

## 2014-11-25 NOTE — ED Provider Notes (Signed)
CSN: [id number]     Arrival date & time 11/25/14  0919 History   First MD Initiated Contact with Patient 11/25/14 [phone]     Chief Complaint  Patient presents with  . Hypertension  . Hyperglycemia     (Consider location/radiation/quality/duration/timing/severity/associated sxs/prior Treatment) HPI Comments: Patient is a 59 yo F PMHx significant for DM, HTN, HLD, CKD stage III, GERD presenting to the ED from [facility] for evaluation of symptomatic hypertension with headache, nausea, and one episode of non-bloody non-bilious emesis yesterday. Patient states she has had intermittent frontal throbbing headaches since Friday. Patient was also noted to be hyperglycemic at [facility] 449. She has been out of all of her home medications for the last 2-3 months. Denies any visual disturbance, loss of consciousness, chest pain, shortness of breath, abdominal pain. Denies any falls or trauma.  Patient is a 59 y.o. female presenting with hypertension and hyperglycemia.  Hypertension Associated symptoms include headaches, nausea and vomiting.  Hyperglycemia Associated symptoms: nausea and vomiting     Past Medical History  Diagnosis Date  . Anxiety   . Depression   . Diabetes mellitus   . GERD (gastroesophageal reflux disease)   . Hyperlipidemia   . Hypertension   . Thyroid disease     hypothyroidism  . Sleep apnea     uses CPAP  . Sarcoidosis of lung   . Chronic kidney disease (CKD), stage III (moderate)   . RBBB   . LVH (left ventricular hypertrophy)     echo 02/25/10 EF >55%  . High cholesterol   . Hyperthyroidism     diagnosed 1989  . H/O breast biopsy 02/10/10    calcification , no tumors  . OSA (obstructive sleep apnea)     severe complex apnea  . CSA (central sleep apnea)   . Pneumonia 04/04   Past Surgical History  Procedure Laterality Date  . Tonsillectomy    . Appendectomy    . Retroperitoneal lymph node excision      right underarm  . Elbow fracture surgery      with pins  and pins removed  . Rhinoplasty      has had several revisions  . Tubinates trimmed    . Laminectomy l5, s1    . Carpal tunnel release      right and left  . Rt knee arthroscopy    . Cholecystectomy    . Laminectomy c5-c6    . Abdominal hysterectomy  12/07   Family History  Problem Relation Age of Onset  . Colitis Father   . Thyroid disease Father   . Colon cancer Maternal Grandmother   . Hypertension Mother   . Stroke Mother   . Thyroid disease Mother   . Thyroid disease Sister   . Thyroid disease Brother   . Thyroid disease Sister   . Thyroid disease Brother    History  Substance Use Topics  . Smoking status: Never Smoker   . Smokeless tobacco: Never Used  . Alcohol Use: No   OB History    Gravida Para Term Preterm AB TAB SAB Ectopic Multiple Living   0 0 0 0 0 0 0 0 0 0      Review of Systems  Gastrointestinal: Positive for nausea and vomiting. Negative for diarrhea.  Neurological: Positive for headaches.  All other systems reviewed and are negative.     Allergies  Doxazosin; Labetalol; Actos; Amlodipine; Cozaar; Glipizide; and Clindamycin hcl  Home Medications   Prior to Admission medications  Medication Sig Start Date End Date Taking? Authorizing Provider  aspirin 81 MG tablet Take 81 mg by mouth daily.     Yes Historical Provider, MD  Cholecalciferol (VITAMIN D) 2000 UNITS CAPS Take by mouth daily.     Yes Historical Provider, MD  levothyroxine (SYNTHROID, LEVOTHROID) 150 MCG tablet Take 1 tablet (150 mcg total) by mouth daily. 09/02/13  Yes Roselee Culver, MD  Magnesium 250 MG TABS Take 1 tablet by mouth 2 (two) times daily.    Yes Historical Provider, MD  metFORMIN (GLUCOPHAGE) 850 MG tablet Take 850 mg by mouth 2 (two) times daily with a meal.   Yes Historical Provider, MD  Multiple Vitamins-Minerals (PRESERVISION AREDS PO) Take 1 capsule by mouth 2 (two) times daily.   Yes Historical Provider, MD  Potassium 99 MG TABS Take 2 tablets by mouth 2  (two) times daily.    Yes Historical Provider, MD  benazepril (LOTENSIN) 20 MG tablet Take 40 mg by mouth 2 (two) times daily. 01/03/13   Mihai Croitoru, MD  benazepril (LOTENSIN) 40 MG tablet Take 40 mg by mouth 2 (two) times daily.    Historical Provider, MD  clonazePAM (KLONOPIN) 0.5 MG tablet Take 0.125 mg by mouth at bedtime as needed for anxiety (sleep). Take 1/4 tablet as needed    Historical Provider, MD  indapamide (LOZOL) 1.25 MG tablet Take 1 tablet (1.25 mg total) by mouth daily. PATIENT NEEDS OFFICE VISIT FOR ADDITIONAL REFILLS Patient not taking: Reported on 11/25/2014 06/02/14   Darlyne Russian, MD  Insulin Glargine (LANTUS SOLOSTAR) 100 UNIT/ML Solostar Pen Inject 22 Units into the skin daily. Patient not taking: Reported on 11/25/2014 03/06/14   Mancel Bale, PA-C  Insulin Pen Needle (B-D ULTRAFINE III SHORT PEN) 31G X 8 MM MISC Inject 1 each into the skin 2 (two) times daily. Patient not taking: Reported on 11/25/2014 04/09/13   Elayne Snare, MD  Insulin Pen Needle (B-D ULTRAFINE III SHORT PEN) 31G X 8 MM MISC Inject 1 each into the skin 2 (two) times daily. Patient not taking: Reported on 11/25/2014 10/01/13   Darlyne Russian, MD  LEXAPRO 20 MG tablet Take 30 mg by mouth daily. Take 1 1/2 daily 12/17/10   Historical Provider, MD  Liraglutide (VICTOZA) 18 MG/3ML SOPN Inject 1.8 mg into the skin daily. Patient not taking: Reported on 11/25/2014 08/08/13   Roselee Culver, MD  mirabegron ER (MYRBETRIQ) 50 MG TB24 Take 50 mg by mouth at bedtime.     Historical Provider, MD  mometasone (NASONEX) 50 MCG/ACT nasal spray Place 2 sprays into the nose at bedtime.    Historical Provider, MD  pravastatin (PRAVACHOL) 40 MG tablet Take 1 tablet (40 mg total) by mouth every evening. Patient not taking: Reported on 11/25/2014 01/03/13   Sanda Klein, MD  Probiotic Product (PROBIOTIC FORMULA PO) Take by mouth daily.      Historical Provider, MD   BP 215/102 mmHg  Pulse 88  Temp(Src) 98.5 F (36.9 C) (Oral)   Resp 18  SpO2 100%  LMP 07/01/2006 Physical Exam  Constitutional: She is oriented to person, place, and time. She appears well-developed and well-nourished. No distress.  HENT:  Head: Normocephalic and atraumatic.  Right Ear: External ear normal.  Left Ear: External ear normal.  Nose: Nose normal.  Mouth/Throat: Oropharynx is clear and moist. No oropharyngeal exudate.  Eyes: Conjunctivae, EOM and lids are normal. Pupils are equal, round, and reactive to light.  Fundoscopic exam:  The right eye shows no hemorrhage and no papilledema.       The left eye shows no hemorrhage and no papilledema.  Neck: Normal range of motion. Neck supple.  Cardiovascular: Normal rate, regular rhythm, normal heart sounds and intact distal pulses.   Pulmonary/Chest: Effort normal and breath sounds normal. No respiratory distress.  Abdominal: Soft. There is no tenderness.  Neurological: She is alert and oriented to person, place, and time. She has normal strength. No cranial nerve deficit. Gait normal. GCS eye subscore is 4. GCS verbal subscore is 5. GCS motor subscore is 6.  Sensation grossly intact.  No pronator drift.  Bilateral heel-knee-shin intact.  Skin: Skin is warm and dry. She is not diaphoretic.  Nursing note and vitals reviewed.   ED Course  Procedures (including critical care time) Medications  benazepril (LOTENSIN) tablet 40 mg (not administered)  hydrALAZINE (APRESOLINE) injection 10 mg (not administered)  insulin glargine (LANTUS) injection 22 Units (not administered)  insulin aspart (novoLOG) injection 0-9 Units (not administered)  insulin aspart (novoLOG) injection 0-5 Units (not administered)  0.9 %  sodium chloride infusion (not administered)  sodium chloride 0.9 % bolus 1,000 mL (0 mLs Intravenous Stopped 11/25/14 1329)  ondansetron (ZOFRAN) injection 4 mg (4 mg Intravenous Given 11/25/14 1015)  hydrALAZINE (APRESOLINE) injection 10 mg (10 mg Intravenous Given 11/25/14 1015)   insulin aspart (novoLOG) injection 10 Units (10 Units Intravenous Given 11/25/14 1100)  acetaminophen (TYLENOL) tablet 650 mg (650 mg Oral Given 11/25/14 1100)  sodium chloride 0.9 % bolus 1,000 mL (0 mLs Intravenous Stopped 11/25/14 1328)  insulin aspart (novoLOG) injection 10 Units (10 Units Intravenous Given 11/25/14 1125)  diphenhydrAMINE (BENADRYL) injection 25 mg (25 mg Intravenous Given 11/25/14 1300)  metoCLOPramide (REGLAN) injection 10 mg (10 mg Intravenous Given 11/25/14 1315)    Labs Review Labs Reviewed  CBC WITH DIFFERENTIAL/PLATELET - Abnormal; Notable for the following:    RBC 5.31 (*)    Hemoglobin 15.1 (*)    All other components within normal limits  URINALYSIS, ROUTINE W REFLEX MICROSCOPIC - Abnormal; Notable for the following:    Specific Gravity, Urine 1.034 (*)    Glucose, UA >1000 (*)    Hgb urine dipstick TRACE (*)    Protein, ur 100 (*)    All other components within normal limits  COMPREHENSIVE METABOLIC PANEL - Abnormal; Notable for the following:    Sodium 132 (*)    Glucose, Bld 501 (*)    BUN 30 (*)    Creatinine, Ser 1.58 (*)    Alkaline Phosphatase 170 (*)    GFR calc non Af Amer 35 (*)    GFR calc Af Amer 41 (*)    All other components within normal limits  CBG MONITORING, ED - Abnormal; Notable for the following:    Glucose-Capillary 489 (*)    All other components within normal limits  CBG MONITORING, ED - Abnormal; Notable for the following:    Glucose-Capillary 311 (*)    All other components within normal limits  URINE MICROSCOPIC-ADD ON  HEMOGLOBIN A1C    Imaging Review Ct Head Wo Contrast  11/25/2014   CLINICAL DATA:  59 year old hypertensive diabetic female with hyperlipidemia and sarcoidosis presenting with nausea, vomiting and headache for the past 4 days. Initial encounter.  EXAM: CT HEAD WITHOUT CONTRAST  TECHNIQUE: Contiguous axial images were obtained from the base of the skull through the vertex without intravenous contrast.   COMPARISON:  02/03/2009 CT.  FINDINGS: No intracranial hemorrhage.  No  CT evidence of large acute infarct.  No hydrocephalus.  No intracranial mass lesion noted on this unenhanced exam.  Opacification left mastoid air cells and portion of the left middle ear cavity. No obvious obstructing lesion of the eustachian tube although the posterior superior nasopharynx was not imaged on the current exam.  Minimal partial opacification left sphenoid sinus air cell.  Mild exophthalmos.  IMPRESSION: No intracranial hemorrhage.  No CT evidence of large acute infarct.  Opacification left mastoid air cell and portion left middle ear cavity.  Please see above.   Electronically Signed   By: Genia Del M.D.   On: 11/25/2014 10:23     EKG Interpretation None      MDM   Final diagnoses:  Hypertensive urgency  Hyperglycemia due to type 2 diabetes mellitus    Filed Vitals:   11/25/14 1330  BP: 215/102  Pulse: 88  Temp:   Resp:    I have reviewed nursing notes, vital signs, and all appropriate lab and imaging results for this patient.  No neurofocal deficits on examination. No intraocular findings. Abdomen soft, non-tender, non-distended. Glucose elevated without an anion gap appreciated. No evidence of ketones on urine. Blood pressure reduced from 215/163 to 172/83. Creatinine at baseline. CT head w/o acute finding. Patient will be admitted for hypertensive urgency. Patient d/w with Dr. Zenia Resides, agrees with plan.      Baron Sane, PA-C 11/25/14 [place], MD 11/27/14 1725

## 2014-11-25 NOTE — Progress Notes (Signed)
UR completed 

## 2014-11-25 NOTE — ED Notes (Signed)
Bed: WA20 Expected date:  Expected time:  Means of arrival:  Comments: EMS hypertension 

## 2014-11-26 ENCOUNTER — Inpatient Hospital Stay (HOSPITAL_COMMUNITY): Payer: 59

## 2014-11-26 DIAGNOSIS — E039 Hypothyroidism, unspecified: Secondary | ICD-10-CM

## 2014-11-26 DIAGNOSIS — N183 Chronic kidney disease, stage 3 (moderate): Secondary | ICD-10-CM | POA: Diagnosis not present

## 2014-11-26 DIAGNOSIS — I10 Essential (primary) hypertension: Secondary | ICD-10-CM | POA: Diagnosis not present

## 2014-11-26 DIAGNOSIS — E1129 Type 2 diabetes mellitus with other diabetic kidney complication: Secondary | ICD-10-CM | POA: Diagnosis not present

## 2014-11-26 DIAGNOSIS — G44219 Episodic tension-type headache, not intractable: Secondary | ICD-10-CM | POA: Diagnosis not present

## 2014-11-26 DIAGNOSIS — E782 Mixed hyperlipidemia: Secondary | ICD-10-CM

## 2014-11-26 DIAGNOSIS — J9621 Acute and chronic respiratory failure with hypoxia: Secondary | ICD-10-CM | POA: Diagnosis not present

## 2014-11-26 LAB — CBC
HEMATOCRIT: 40.3 % (ref 36.0–46.0)
Hemoglobin: 13.3 g/dL (ref 12.0–15.0)
MCH: 27.7 pg (ref 26.0–34.0)
MCHC: 33 g/dL (ref 30.0–36.0)
MCV: 84 fL (ref 78.0–100.0)
Platelets: 167 10*3/uL (ref 150–400)
RBC: 4.8 MIL/uL (ref 3.87–5.11)
RDW: 13.2 % (ref 11.5–15.5)
WBC: 9 10*3/uL (ref 4.0–10.5)

## 2014-11-26 LAB — GLUCOSE, CAPILLARY
Glucose-Capillary: 315 mg/dL — ABNORMAL HIGH (ref 70–99)
Glucose-Capillary: 338 mg/dL — ABNORMAL HIGH (ref 70–99)
Glucose-Capillary: 312 mg/dL — ABNORMAL HIGH (ref 70–99)
Glucose-Capillary: 240 mg/dL — ABNORMAL HIGH (ref 70–99)

## 2014-11-26 LAB — BASIC METABOLIC PANEL
ANION GAP: 8 (ref 5–15)
BUN: 29 mg/dL — ABNORMAL HIGH (ref 6–23)
CHLORIDE: 104 mmol/L (ref 96–112)
CO2: 28 mmol/L (ref 19–32)
Calcium: 8.7 mg/dL (ref 8.4–10.5)
Creatinine, Ser: 1.67 mg/dL — ABNORMAL HIGH (ref 0.50–1.10)
GFR calc Af Amer: 38 mL/min — ABNORMAL LOW (ref >90)
GFR, EST NON AFRICAN AMERICAN: 33 mL/min — AB (ref >90)
Glucose, Bld: 273 mg/dL — ABNORMAL HIGH (ref 70–99)
POTASSIUM: 3.9 mmol/L (ref 3.5–5.1)
Sodium: 140 mmol/L (ref 135–145)

## 2014-11-26 LAB — TSH: TSH: 0.196 u[IU]/mL — ABNORMAL LOW (ref 0.350–4.500)

## 2014-11-26 LAB — HEMOGLOBIN A1C
Hgb A1c MFr Bld: 11.8 % — ABNORMAL HIGH (ref 4.8–5.6)
Mean Plasma Glucose: 292 mg/dL

## 2014-11-26 MED ORDER — IPRATROPIUM-ALBUTEROL 0.5-2.5 (3) MG/3ML IN SOLN: 3.0000 mL | RESPIRATORY_TRACT | Status: DC | PRN

## 2014-11-26 MED ORDER — LEVOTHYROXINE SODIUM 100 MCG PO TABS
100.0000 ug | ORAL_TABLET | Freq: Every day | ORAL | Status: DC
  Administered 2014-11-27 – 2014-11-28 (×2): 100 ug via ORAL
  Filled 2014-11-26: qty 2
  Filled 2014-11-26 (×2): qty 1

## 2014-11-26 MED ORDER — NEBIVOLOL HCL 5 MG PO TABS
5.0000 mg | ORAL_TABLET | Freq: Every day | ORAL | Status: DC
  Administered 2014-11-26: 5 mg via ORAL
  Filled 2014-11-26 (×2): qty 1

## 2014-11-26 MED ORDER — MORPHINE SULFATE 2 MG/ML IJ SOLN
2.0000 mg | Freq: Once | INTRAMUSCULAR | Status: AC
  Administered 2014-11-26: 2 mg via INTRAVENOUS
  Filled 2014-11-26: qty 1

## 2014-11-26 MED ORDER — HYDRALAZINE HCL 20 MG/ML IJ SOLN
10.0000 mg | Freq: Once | INTRAMUSCULAR | Status: AC
  Administered 2014-11-26: 10 mg via INTRAVENOUS

## 2014-11-26 MED ORDER — HYDRALAZINE HCL 25 MG PO TABS
25.0000 mg | ORAL_TABLET | Freq: Three times a day (TID) | ORAL | Status: DC
  Administered 2014-11-26 – 2014-11-28 (×6): 25 mg via ORAL
  Filled 2014-11-26 (×6): qty 1

## 2014-11-26 NOTE — Progress Notes (Addendum)
Patient ID: Rachel Kim, female   DOB: Dec 14, 1955, 59 y.o.   MRN: [id number] [facility] PROGRESS NOTE  BRANDIE LOPES VWP:[id number] DOB: January 03, 1956 DOA: 11/25/2014 PCP: Jenny Reichmann, MD  Brief narrative:    59 y.o. pleasant female with history of uncontrolled type 2 diabetes, dyslipidemia, hypertension, hypothyroidism, CKD stage 3, non compliant with insulin. Pt presented to [facility] from [facility] [place] with headaches and hypertension. Headache was in temporal area but also reported intermittently diffuse, 9/10 in intensity, non radiating and somewhat relieved with aspirin. She has had intermittent headaches for past 2-3 days prior to this admission. No blurred vision. No lightheadedness or vision loss. As noted, pt is not compliant with medications due to limited financial resources and has had insurance problems and was unable to fill medications.  In ED, initial blood pressure was 215/163 which has improved to 189/86 with IV hydralazine 10 mg. Blood work was significant for glucose of 501, CBG 489,  creatinine 1.58 (baseline 1.7 in 09/2012). Urinalysis showed glucose of more than 1000. She was admitted to SDU due to accelerated blood pressure.  Barrier to discharge: uncontrolled BP, 203/81. Will call cardio consult for input on BP management since pt BP fluctuates from 120's to 200's range.    Assessment/Plan:    Principal Problem: Headache / Accelerated hypertension - Pt presented with headaches which is likely due to uncontrolled blood pressure. As noted on admission, BP was 215/63 which likely reflects poor compliance with medications due to limited financial resources. At the time of D/C will try to prescribe meds that can be filled at lower cost. - No acute intracranial findings seen on CT head - Headache is 2-3/10 this morning. Blood pressure is 203/81 but it was 128/60 earlier this am. - Will ask cardio for input on blood pressure control. Consult to cardio placed.  - I have  started hydralazine 25 mg every 8 hours since it seemed that IV hydralazine provided reasonable control. I stopped benazepril at least for today to see if renal function will improve.   - Continue to monitor in SDU for now.   Active Problems Hypothyroidism - TSH low on this admission, 0.196 - Reduce synthroid from 150 mcg to 100 mcg daily.  Mixed hyperlipidemia - Continue Pravachol 40 mg at bedtime   Morbid obesity - Body mass index is 43.58 kg/(m^2). - Nutrition consulted  OSA on CPAP / acute on chronic respiratory failure with hypoxia - Stable  - Add duoneb for shortness of breath or wheezing if needed   DM type 2, uncontrolled, with renal complications - V7S 82.7 on this admission indicating poor glycemic control - Resumed Lantus 22 units daily along with SSI - Appreciate DM coordinator input - Will continue to hold metformin   Chronic kidney disease, stage 3 / Diabetic nephropathy - Baseline Cr 1.7 in 09/2012. On this admission 1.5 but trending up possibly from benazepril and metformin although of questionable compliance with those meds. - Stopped benazepril today. Metformin on hold and probably should be on hold since A1c 11.8 and pt should ideally be on insulin regimen. - Continue to monitor renal function.     DVT Prophylaxis  - Lovenox subQ ordered    Code Status: Full.  Family Communication:  plan of care discussed with the patient; family not at the bedside this am  Disposition Plan: Needs better blood pressure control, this am BP as high as 203/81.  IV access:  Peripheral IV  Procedures and diagnostic studies:  Ct Head Wo Contrast 11/25/2014 No intracranial hemorrhage.  No CT evidence of large acute infarct.  Opacification left mastoid air cell and portion left middle ear cavity.  Please see above.   Electronically Signed   By: Genia Del M.D.   On: 11/25/2014 10:23    Medical Consultants:  Cardiology  Other Consultants:  Nutrition DM  coordinator  IAnti-Infectives:   None    Rachel Lenz, MD  [facility] Pager [phone]  Time spent in minutes: 25 minutes  If 7PM-7AM, please contact night-coverage [email or web address] Password [email or web address] 11/26/2014, 10:10 AM   LOS: 1 day    HPI/Subjective: No acute overnight events. Patient reports headache, diffuse, 2-3/10 in intensity. No nausea or vomiting.   Objective: Filed Vitals:   11/26/14 0600 11/26/14 0700 11/26/14 0800 11/26/14 0900  BP: 140/58 128/60  203/81  Pulse: 102 107  99  Temp:   98.6 F (37 C)   TempSrc:   Oral   Resp: 13 23  13   Height:      Weight:      SpO2: 94% 95%  88%    Intake/Output Summary (Last 24 hours) at 11/26/14 1010 Last data filed at 11/26/14 0700  Gross per 24 hour  Intake    740 ml  Output    850 ml  Net   -110 ml    Exam:   General:  Pt is alert, follows commands appropriately, not in acute distress  Cardiovascular: Regular rate and rhythm, S1/S2, no murmurs  Respiratory: Clear to auscultation bilaterally, no wheezing, no crackles, no rhonchi  Abdomen: Soft, non tender, non distended, bowel sounds present  Extremities: No edema, pulses DP and PT palpable bilaterally  Neuro: Grossly nonfocal  Data Reviewed: Basic Metabolic Panel:  Recent Labs Lab 11/25/14 1002 11/26/14 0335  NA 132* 140  K 4.3 3.9  CL 96 104  CO2 26 28  GLUCOSE 501* 273*  BUN 30* 29*  CREATININE 1.58* 1.67*  CALCIUM 9.1 8.7   Liver Function Tests:  Recent Labs Lab 11/25/14 1002  AST 27  ALT 26  ALKPHOS 170*  BILITOT 0.8  PROT 7.7  ALBUMIN 4.2   No results for input(s): LIPASE, AMYLASE in the last 168 hours. No results for input(s): AMMONIA in the last 168 hours. CBC:  Recent Labs Lab 11/25/14 1002 11/26/14 0335  WBC 7.9 9.0  NEUTROABS 5.6  --   HGB 15.1* 13.3  HCT 43.4 40.3  MCV 81.7 84.0  PLT 164 167   Cardiac Enzymes: No results for input(s): CKTOTAL, CKMB, CKMBINDEX, TROPONINI in the last 168  hours. BNP: Invalid input(s): POCBNP CBG:  Recent Labs Lab 11/25/14 1024 11/25/14 1243 11/25/14 1806 11/25/14 2057 11/26/14 0745  GLUCAP 489* 311* 327* 354* 315*    Recent Results (from the past 240 hour(s))  MRSA PCR Screening     Status: None   Collection Time: 11/25/14  9:19 AM  Result Value Ref Range Status   MRSA by PCR NEGATIVE NEGATIVE Final     Scheduled Meds: . aspirin EC  81 mg Oral Daily  . benazepril  40 mg Oral BID  . enoxaparin (LOVENOX)   60 mg Subcutaneous Q24H  . fluticasone  2 spray Each Nare QHS  . insulin aspart  0-5 Units Subcutaneous QHS  . insulin aspart  0-9 Units Subcutaneous TID WC  . insulin glargine  22 Units Subcutaneous Daily  . levothyroxine  150 mcg Oral QAC breakfast  . magnesium oxide  400 mg Oral BID  .  pravastatin  40 mg Oral QPM

## 2014-11-26 NOTE — Consult Note (Signed)
Cardiologist:  Croitoru Reason for Consult: hypertensive urgency  Referring Physician: Kaleya Kim is an 59 y.o. female.  HPI:   Rachel Kim is morbidly obese and has severe systemic hypertension, diabetes mellitus that is not controlled, chronic kidney disease stage 2-3 and treated obstructive sleep apnea.  According to Dr. Victorino December note(04/2013-last seen), he has had difficulty with blood pressure control since she has side effects with several medications. She also has problems with medication noncompliance. She was having a lot of stress at work back then which forces her to eat junk food. She is really not doing a great job with sodium restriction.   Back in 2014 she was on Benazapril 40 and Nebivolol. CCB's caused ankle edema. Attempts at treatment with carvedilol or high doses of metoprolol or associate with intolerable fatigue that prevented her from working.   She reports feeling poorly beginning on Friday last week.  Then on Monday into Tuesday she developed a headache and started vomiting.  She has been off her meds for about two months because of financial challenges.  She has recently started a new job which has a lot less stress than the previous.  She has lost weight, ~30lbs since 04/2013.  She currently denies fever, chest pain, shortness of breath, orthopnea, dizziness, PND, cough, congestion, abdominal pain, hematochezia, melena, lower extremity edema, claudication.   Medications: Scheduled Meds: . aspirin EC  81 mg Oral Daily  . enoxaparin (LOVENOX) injection  60 mg Subcutaneous Q24H  . fluticasone  2 spray Each Nare QHS  . hydrALAZINE  25 mg Oral 3 times per day  . insulin aspart  0-5 Units Subcutaneous QHS  . insulin aspart  0-9 Units Subcutaneous TID WC  . insulin glargine  22 Units Subcutaneous Daily  . [START ON 11/27/2014] levothyroxine  100 mcg Oral QAC breakfast  . magnesium oxide  400 mg Oral BID  . pravastatin  40 mg Oral QPM  . sodium chloride  3  mL Intravenous Q12H   Continuous Infusions:  PRN Meds:.acetaminophen **OR** acetaminophen, hydrALAZINE, ipratropium-albuterol, ondansetron **OR** ondansetron (ZOFRAN) IV, oxyCODONE   Past Medical History  Diagnosis Date  . Anxiety   . Depression   . Diabetes mellitus   . GERD (gastroesophageal reflux disease)   . Hyperlipidemia   . Hypertension   . Thyroid disease     hypothyroidism  . Sleep apnea     uses CPAP  . Sarcoidosis of lung   . Chronic kidney disease (CKD), stage III (moderate)   . RBBB   . LVH (left ventricular hypertrophy)     echo 02/25/10 EF >55%  . High cholesterol   . Hyperthyroidism     diagnosed 1989  . H/O breast biopsy 02/10/10    calcification , no tumors  . OSA (obstructive sleep apnea)     severe complex apnea  . CSA (central sleep apnea)   . Pneumonia 04/04    Past Surgical History  Procedure Laterality Date  . Tonsillectomy    . Appendectomy    . Retroperitoneal lymph node excision      right underarm  . Elbow fracture surgery      with pins and pins removed  . Rhinoplasty      has had several revisions  . Tubinates trimmed    . Laminectomy l5, s1    . Carpal tunnel release      right and left  . Rt knee arthroscopy    . Cholecystectomy    .  Laminectomy c5-c6    . Abdominal hysterectomy  12/07    Family History  Problem Relation Age of Onset  . Colitis Father   . Thyroid disease Father   . Colon cancer Maternal Grandmother   . Hypertension Mother   . Stroke Mother   . Thyroid disease Mother   . Thyroid disease Sister   . Thyroid disease Brother   . Thyroid disease Sister   . Thyroid disease Brother     Social History:  reports that she has never smoked. She has never used smokeless tobacco. She reports that she does not drink alcohol or use illicit drugs.  Allergies:  Allergies  Allergen Reactions  . Doxazosin Swelling  . Labetalol Swelling  . Actos [Pioglitazone Hydrochloride] Swelling  . Amlodipine Swelling  .  Cozaar [Losartan Potassium] Other (See Comments)    Causes drowsiness/zombie like  . Glipizide Swelling  . Clindamycin Hcl Rash     Results for orders placed or performed during the hospital encounter of 11/25/14 (from the past 48 hour(s))  MRSA PCR Screening     Status: None   Collection Time: 11/25/14  9:19 AM  Result Value Ref Range   MRSA by PCR NEGATIVE NEGATIVE    Comment:        The GeneXpert MRSA Assay (FDA approved for NASAL specimens only), is one component of a comprehensive MRSA colonization surveillance program. It is not intended to diagnose MRSA infection nor to guide or monitor treatment for MRSA infections.   CBC with Differential     Status: Abnormal   Collection Time: 11/25/14 10:02 AM  Result Value Ref Range   WBC 7.9 4.0 - 10.5 K/uL   RBC 5.31 (H) 3.87 - 5.11 MIL/uL   Hemoglobin 15.1 (H) 12.0 - 15.0 g/dL   HCT 43.4 36.0 - 46.0 %   MCV 81.7 78.0 - 100.0 fL   MCH 28.4 26.0 - 34.0 pg   MCHC 34.8 30.0 - 36.0 g/dL   RDW 12.9 11.5 - 15.5 %   Platelets 164 150 - 400 K/uL   Neutrophils Relative % 71 43 - 77 %   Neutro Abs 5.6 1.7 - 7.7 K/uL   Lymphocytes Relative 19 12 - 46 %   Lymphs Abs 1.5 0.7 - 4.0 K/uL   Monocytes Relative 6 3 - 12 %   Monocytes Absolute 0.5 0.1 - 1.0 K/uL   Eosinophils Relative 3 0 - 5 %   Eosinophils Absolute 0.3 0.0 - 0.7 K/uL   Basophils Relative 1 0 - 1 %   Basophils Absolute 0.1 0.0 - 0.1 K/uL  Comprehensive metabolic panel     Status: Abnormal   Collection Time: 11/25/14 10:02 AM  Result Value Ref Range   Sodium 132 (L) 135 - 145 mmol/L   Potassium 4.3 3.5 - 5.1 mmol/L   Chloride 96 96 - 112 mmol/L   CO2 26 19 - 32 mmol/L   Glucose, Bld 501 (H) 70 - 99 mg/dL   BUN 30 (H) 6 - 23 mg/dL   Creatinine, Ser 1.58 (H) 0.50 - 1.10 mg/dL   Calcium 9.1 8.4 - 10.5 mg/dL   Total Protein 7.7 6.0 - 8.3 g/dL   Albumin 4.2 3.5 - 5.2 g/dL   AST 27 0 - 37 U/L   ALT 26 0 - 35 U/L   Alkaline Phosphatase 170 (H) 39 - 117 U/L   Total  Bilirubin 0.8 0.3 - 1.2 mg/dL   GFR calc non Af Amer 35 (L) >90  mL/min   GFR calc Af Amer 41 (L) >90 mL/min    Comment: (NOTE) The eGFR has been calculated using the CKD EPI equation. This calculation has not been validated in all clinical situations. eGFR's persistently <90 mL/min signify possible Chronic Kidney Disease.    Anion gap 10 5 - 15  Urinalysis, Routine w reflex microscopic     Status: Abnormal   Collection Time: 11/25/14 10:03 AM  Result Value Ref Range   Color, Urine YELLOW YELLOW   APPearance CLEAR CLEAR   Specific Gravity, Urine 1.034 (H) 1.005 - 1.030   pH 6.0 5.0 - 8.0   Glucose, UA >1000 (A) NEGATIVE mg/dL   Hgb urine dipstick TRACE (A) NEGATIVE   Bilirubin Urine NEGATIVE NEGATIVE   Ketones, ur NEGATIVE NEGATIVE mg/dL   Protein, ur 100 (A) NEGATIVE mg/dL   Urobilinogen, UA 0.2 0.0 - 1.0 mg/dL   Nitrite NEGATIVE NEGATIVE   Leukocytes, UA NEGATIVE NEGATIVE  Urine microscopic-add on     Status: None   Collection Time: 11/25/14 10:03 AM  Result Value Ref Range   Squamous Epithelial / LPF RARE RARE   WBC, UA 0-2 <3 WBC/hpf   RBC / HPF 0-2 <3 RBC/hpf   Bacteria, UA RARE RARE   Urine-Other RARE YEAST   Hemoglobin A1c     Status: Abnormal   Collection Time: 11/25/14 10:10 AM  Result Value Ref Range   Hgb A1c MFr Bld 11.8 (H) 4.8 - 5.6 %    Comment: (NOTE)         Pre-diabetes: 5.7 - 6.4         Diabetes: >6.4         Glycemic control for adults with diabetes: <7.0    Mean Plasma Glucose 292 mg/dL    Comment: (NOTE) Performed At: [facility] [place], [place] [id number] Lindon Romp MD WU:[id number]   CBG monitoring, ED     Status: Abnormal   Collection Time: 11/25/14 10:24 AM  Result Value Ref Range   Glucose-Capillary 489 (H) 70 - 99 mg/dL  CBG monitoring, ED     Status: Abnormal   Collection Time: 11/25/14 12:43 PM  Result Value Ref Range   Glucose-Capillary 311 (H) 70 - 99 mg/dL  Glucose, capillary     Status: Abnormal     Collection Time: 11/25/14  6:06 PM  Result Value Ref Range   Glucose-Capillary 327 (H) 70 - 99 mg/dL  Glucose, capillary     Status: Abnormal   Collection Time: 11/25/14  8:57 PM  Result Value Ref Range   Glucose-Capillary 354 (H) 70 - 99 mg/dL  Basic metabolic panel     Status: Abnormal   Collection Time: 11/26/14  3:35 AM  Result Value Ref Range   Sodium 140 135 - 145 mmol/L    Comment: DELTA CHECK NOTED REPEATED TO VERIFY    Potassium 3.9 3.5 - 5.1 mmol/L   Chloride 104 96 - 112 mmol/L    Comment: DELTA CHECK NOTED REPEATED TO VERIFY    CO2 28 19 - 32 mmol/L   Glucose, Bld 273 (H) 70 - 99 mg/dL   BUN 29 (H) 6 - 23 mg/dL   Creatinine, Ser 1.67 (H) 0.50 - 1.10 mg/dL   Calcium 8.7 8.4 - 10.5 mg/dL   GFR calc non Af Amer 33 (L) >90 mL/min   GFR calc Af Amer 38 (L) >90 mL/min    Comment: (NOTE) The eGFR has been calculated using the CKD EPI  equation. This calculation has not been validated in all clinical situations. eGFR's persistently <90 mL/min signify possible Chronic Kidney Disease.    Anion gap 8 5 - 15  CBC     Status: None   Collection Time: 11/26/14  3:35 AM  Result Value Ref Range   WBC 9.0 4.0 - 10.5 K/uL   RBC 4.80 3.87 - 5.11 MIL/uL   Hemoglobin 13.3 12.0 - 15.0 g/dL   HCT 40.3 36.0 - 46.0 %   MCV 84.0 78.0 - 100.0 fL   MCH 27.7 26.0 - 34.0 pg   MCHC 33.0 30.0 - 36.0 g/dL   RDW 13.2 11.5 - 15.5 %   Platelets 167 150 - 400 K/uL  TSH     Status: Abnormal   Collection Time: 11/26/14  3:38 AM  Result Value Ref Range   TSH 0.196 (L) 0.350 - 4.500 uIU/mL  Glucose, capillary     Status: Abnormal   Collection Time: 11/26/14  7:45 AM  Result Value Ref Range   Glucose-Capillary 315 (H) 70 - 99 mg/dL    Ct Head Wo Contrast  11/25/2014   CLINICAL DATA:  59 year old hypertensive diabetic female with hyperlipidemia and sarcoidosis presenting with nausea, vomiting and headache for the past 4 days. Initial encounter.  EXAM: CT HEAD WITHOUT CONTRAST  TECHNIQUE:  Contiguous axial images were obtained from the base of the skull through the vertex without intravenous contrast.  COMPARISON:  02/03/2009 CT.  FINDINGS: No intracranial hemorrhage.  No CT evidence of large acute infarct.  No hydrocephalus.  No intracranial mass lesion noted on this unenhanced exam.  Opacification left mastoid air cells and portion of the left middle ear cavity. No obvious obstructing lesion of the eustachian tube although the posterior superior nasopharynx was not imaged on the current exam.  Minimal partial opacification left sphenoid sinus air cell.  Mild exophthalmos.  IMPRESSION: No intracranial hemorrhage.  No CT evidence of large acute infarct.  Opacification left mastoid air cell and portion left middle ear cavity.  Please see above.   Electronically Signed   By: Genia Del M.D.   On: 11/25/2014 10:23    Review of Systems  Constitutional: Negative for fever.       Has felt hot at times.  HENT: Negative for congestion.   Respiratory: Negative for cough and shortness of breath.   Cardiovascular: Negative for chest pain, orthopnea, leg swelling and PND.  Gastrointestinal: Positive for nausea and vomiting. Negative for abdominal pain, blood in stool and melena.  Genitourinary: Negative for hematuria.  Musculoskeletal: Negative for myalgias.  Neurological: Negative for dizziness.  All other systems reviewed and are negative.  Blood pressure 164/70, pulse 100, temperature 98.4 F (36.9 C), temperature source Oral, resp. rate 14, height $RemoveBe'5\' 5"'[id number]$  (1.651 m), weight 261 lb 14.5 oz (118.8 kg), last menstrual period 07/01/2006, SpO2 95 %. Physical Exam  Nursing note and vitals reviewed. Constitutional: She is oriented to person, place, and time. She appears well-developed and well-nourished. No distress.  HENT:  Head: Normocephalic and atraumatic.  Eyes: EOM are normal. Pupils are equal, round, and reactive to light. No scleral icterus.  Neck: Normal range of motion. Neck supple.    Cardiovascular: Normal rate, regular rhythm, S1 normal and S2 normal.   Murmur heard.  Systolic murmur is present with a grade of 1/6  Pulses:      Radial pulses are 2+ on the right side, and 2+ on the left side.       Dorsalis  pedis pulses are 2+ on the right side, and 2+ on the left side.  MM at RSB  Respiratory: Effort normal and breath sounds normal. She has no wheezes. She has no rales.  GI: Soft. Bowel sounds are normal. She exhibits no distension.  Musculoskeletal: She exhibits no edema.  Lymphadenopathy:    She has no cervical adenopathy.  Neurological: She is alert and oriented to person, place, and time. She exhibits normal muscle tone.  Skin: Skin is warm and dry.  Psychiatric: She has a normal mood and affect.    Assessment/Plan: Principal Problem:   Hypertensive urgency Active Problems:   Hypothyroidism   Mixed hyperlipidemia   Morbid obesity   OSA on CPAP   DM type 2, uncontrolled, with renal complications   Type II diabetes mellitus with stage 2 chronic kidney disease   Chronic kidney disease, Stage 3   RBBB-old  Recommend continuing to titrate hydralazine and switch back to benazepril when kidney function improves.  She would also benefit from restarting bystolic 5mg  and titrating.     Tarri Fuller, [place] 11/26/2014, 1:21 PM   Patient seen. Agree with above assessment and plan. She does have a grade 2-0/7 systolic murmur at LSE. No gallop. Her last echo was in 2011.  Had diastolic dysfunction and LVH with normal systolic function at that time. Will update echo.

## 2014-11-26 NOTE — Progress Notes (Signed)
CSW received referral that pt unable to afford medications.  Inappropriate CSW referral. CSW to notify RNCM of concern.  No further social work needs identified.   CSW signing off.   Alison Murray, MSW, [facility] [phone]

## 2014-11-26 NOTE — Progress Notes (Signed)
Inpatient Diabetes Program Recommendations  AACE/ADA: New Consensus Statement on Inpatient Glycemic Control (2013)  Target Ranges:  Prepandial:   less than 140 mg/dL      Peak postprandial:   less than 180 mg/dL (1-2 hours)      Critically ill patients:  140 - 180 mg/dL   Reason for Visit: Diabetes Consult  Diabetes history: DM2 Outpatient Diabetes medications: None x past 2 months. Previously on Lantus 22 units QHS and Victoza 1.8 mg QD Current orders for Inpatient glycemic control: Lantus 22 units QD and Novolog sensitive tidwc and hs  59 y.o. pleasant female with history of uncontrolled type 2 diabetes, dyslipidemia, hypertension, hypothyroidism, CKD stage 3, non compliant with insulin, presents to [facility] with hypertension and hyperglycemia. Sent from PCP office. Has not taken blood pressure or diabetes meds in 2 months since losing her job. States she thinks she has insurance now, but would like assistance in whether her policy will cover her meds. Has had diabetes education at [facility] and seems very knowledgeable of importance of glycemic control to prevent long-term complications. Results for VALORIE, MCGRORY (MRN [id number]) as of 11/26/2014 15:32  Ref. Range 11/25/2014 10:10  Hemoglobin A1C Latest Ref Range: 4.8-5.6 % 11.8 (H)  Results for MADDILYNN, ESPERANZA (MRN [id number]) as of 11/26/2014 15:32  Ref. Range 11/25/2014 10:24 11/25/2014 12:43 11/25/2014 18:06 11/25/2014 20:57 11/26/2014 07:45  Glucose-Capillary Latest Ref Range: 70-99 mg/dL 489 (H) 311 (H) 327 (H) 354 (H) 315 (H)  Hyperglycemia. Will need insulin adjustment. Will likely need to go home on Lantus and Novolog, along with GLP-1 (Victoza)   Inpatient Diabetes Program Recommendations Insulin - Basal: Increase Lantus to 30 units Q24H Correction (SSI): Increase Novolog to moderate tidwc and hs Insulin - Meal Coverage: Add Novolog 4 units tidwc for meal coverage HgbA1C: 11.8% - uncontrolled Outpatient Referral: OP Diabetes Education consult  for uncontrolled DM  Note: Will order care manager consult for info regarding insurance coverage with her meds. Will follow daily. Thank you. Lorenda Peck, RD, LDN, CDE Inpatient Diabetes Coordinator [phone]

## 2014-11-27 DIAGNOSIS — R51 Headache: Secondary | ICD-10-CM

## 2014-11-27 DIAGNOSIS — E1129 Type 2 diabetes mellitus with other diabetic kidney complication: Secondary | ICD-10-CM | POA: Diagnosis not present

## 2014-11-27 DIAGNOSIS — N183 Chronic kidney disease, stage 3 unspecified: Secondary | ICD-10-CM | POA: Diagnosis present

## 2014-11-27 DIAGNOSIS — E1122 Type 2 diabetes mellitus with diabetic chronic kidney disease: Secondary | ICD-10-CM | POA: Insufficient documentation

## 2014-11-27 DIAGNOSIS — I10 Essential (primary) hypertension: Secondary | ICD-10-CM | POA: Diagnosis not present

## 2014-11-27 DIAGNOSIS — E782 Mixed hyperlipidemia: Secondary | ICD-10-CM | POA: Diagnosis not present

## 2014-11-27 DIAGNOSIS — J9621 Acute and chronic respiratory failure with hypoxia: Secondary | ICD-10-CM | POA: Diagnosis not present

## 2014-11-27 DIAGNOSIS — G4733 Obstructive sleep apnea (adult) (pediatric): Secondary | ICD-10-CM | POA: Diagnosis not present

## 2014-11-27 DIAGNOSIS — R011 Cardiac murmur, unspecified: Secondary | ICD-10-CM | POA: Diagnosis not present

## 2014-11-27 DIAGNOSIS — R519 Headache, unspecified: Secondary | ICD-10-CM | POA: Diagnosis present

## 2014-11-27 DIAGNOSIS — N184 Chronic kidney disease, stage 4 (severe): Secondary | ICD-10-CM | POA: Insufficient documentation

## 2014-11-27 HISTORY — DX: Acute and chronic respiratory failure with hypoxia: J96.21

## 2014-11-27 LAB — BASIC METABOLIC PANEL
ANION GAP: 8 (ref 5–15)
BUN: 28 mg/dL — AB (ref 6–23)
CHLORIDE: 98 mmol/L (ref 96–112)
CO2: 28 mmol/L (ref 19–32)
Calcium: 8.6 mg/dL (ref 8.4–10.5)
Creatinine, Ser: 1.78 mg/dL — ABNORMAL HIGH (ref 0.50–1.10)
GFR calc non Af Amer: 30 mL/min — ABNORMAL LOW (ref >90)
GFR, EST AFRICAN AMERICAN: 35 mL/min — AB (ref >90)
Glucose, Bld: 309 mg/dL — ABNORMAL HIGH (ref 70–99)
Potassium: 4.7 mmol/L (ref 3.5–5.1)
Sodium: 134 mmol/L — ABNORMAL LOW (ref 135–145)

## 2014-11-27 LAB — GLUCOSE, CAPILLARY
Glucose-Capillary: 245 mg/dL — ABNORMAL HIGH (ref 70–99)
Glucose-Capillary: 209 mg/dL — ABNORMAL HIGH (ref 70–99)
Glucose-Capillary: 236 mg/dL — ABNORMAL HIGH (ref 70–99)
Glucose-Capillary: 191 mg/dL — ABNORMAL HIGH (ref 70–99)

## 2014-11-27 MED ORDER — BUTALBITAL-APAP-CAFFEINE 50-325-40 MG PO TABS
1.0000 | ORAL_TABLET | Freq: Once | ORAL | Status: AC
  Administered 2014-11-27: 1 via ORAL
  Filled 2014-11-27: qty 1

## 2014-11-27 MED ORDER — NEBIVOLOL HCL 10 MG PO TABS
10.0000 mg | ORAL_TABLET | Freq: Every day | ORAL | Status: DC
  Administered 2014-11-28: 10 mg via ORAL
  Filled 2014-11-27 (×3): qty 1

## 2014-11-27 MED ORDER — MORPHINE SULFATE 2 MG/ML IJ SOLN
1.0000 mg | Freq: Once | INTRAMUSCULAR | Status: AC
  Administered 2014-11-27: 1 mg via INTRAVENOUS
  Filled 2014-11-27: qty 1

## 2014-11-27 MED ORDER — INSULIN GLARGINE 100 UNIT/ML ~~LOC~~ SOLN
30.0000 [IU] | Freq: Every day | SUBCUTANEOUS | Status: DC
  Administered 2014-11-28: 30 [IU] via SUBCUTANEOUS
  Filled 2014-11-27 (×3): qty 0.3

## 2014-11-27 MED ORDER — INSULIN ASPART 100 UNIT/ML ~~LOC~~ SOLN
4.0000 [IU] | Freq: Three times a day (TID) | SUBCUTANEOUS | Status: DC
  Administered 2014-11-27 – 2014-11-28 (×5): 4 [IU] via SUBCUTANEOUS

## 2014-11-27 NOTE — Progress Notes (Signed)
Nutrition Education Note  RD consulted for nutrition education regarding a Heart Healthy diet.   Lipid Panel     Component Value Date/Time   CHOL 129 06/11/2013 1009   TRIG 227* 06/11/2013 1009   HDL 34* 06/11/2013 1009   CHOLHDL 3.8 06/11/2013 1009   VLDL 45* 06/11/2013 1009   LDLCALC 50 06/11/2013 1009    RD provided "Heart Healthy Nutrition Therapy" handout from the Academy of [employer]. Reviewed patient's dietary recall. Provided examples on ways to decrease sodium and fat intake in diet. Discouraged intake of processed foods and use of salt shaker. Encouraged fresh fruits and vegetables as well as whole grain sources of carbohydrates to maximize fiber intake. Teach back method used.  Pt reports she has had this education in the past. She was able to give great examples of high sodium-containing foods and why limiting salt in the diet is important as it relates to fluid retention and HTN. Pt with hx of HTN. She reports that she has made some changes in her diet to limit salt intake.  Expect good compliance.  Body mass index is 43.58 kg/(m^2). Pt meets criteria for morbid obesity based on current BMI.  Current diet order is Heart Healthy/Carb Modified, patient is consuming approximately 100% of meals at this time. Labs and medications reviewed. No further nutrition interventions warranted at this time. RD contact information provided. If additional nutrition issues arise, please re-consult RD.  Jarome Matin, RD, LDN Inpatient Clinical Dietitian Pager # [phone] After hours/weekend pager # [phone]

## 2014-11-27 NOTE — Progress Notes (Signed)
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   CURRENT MEDS . aspirin EC  81 mg Oral Daily  . enoxaparin (LOVENOX) injection  60 mg Subcutaneous Q24H  . fluticasone  2 spray Each Nare QHS  . hydrALAZINE  25 mg Oral 3 times per day  . insulin aspart  0-5 Units Subcutaneous QHS  . insulin aspart  0-9 Units Subcutaneous TID WC  . insulin aspart  4 Units Subcutaneous TID WC  . insulin glargine  30 Units Subcutaneous Daily  . levothyroxine  100 mcg Oral QAC breakfast  . magnesium oxide  400 mg Oral BID  . nebivolol  5 mg Oral Daily  . pravastatin  40 mg Oral QPM  . sodium chloride  3 mL Intravenous Q12H    OBJECTIVE  Filed Vitals:   11/27/14 0500 11/27/14 0600 11/27/14 0620 11/27/14 0800  BP: 153/66 177/121 177/121   Pulse: 90 86    Temp:    99 F (37.2 C)  TempSrc:    Oral  Resp: 13 10    Height:      Weight:      SpO2: 93% 97%      Intake/Output Summary (Last 24 hours) at 11/27/14 0817 Last data filed at 11/27/14 0353  Gross per 24 hour  Intake      3 ml  Output   1600 ml  Net  -1597 ml   Filed Weights   11/25/14 1808 11/26/14 0342  Weight: 258 lb 9.6 oz (117.3 kg) 261 lb 14.5 oz (118.8 kg)    PHYSICAL EXAM  General: Pleasant, NAD. Neuro: Alert and oriented X 3. Moves all extremities spontaneously. Psych: Normal affect. HEENT:  Normal  Neck: Supple without bruits or JVD. Lungs:  Resp regular and unlabored, CTA. Heart: RRR no s3, s4, or murmurs. Abdomen: Soft, non-tender, non-distended, BS + x 4.  Extremities: No clubbing, cyanosis or edema. DP/PT/Radials 2+ and equal bilaterally.  Accessory Clinical  Findings  CBC  Recent Labs  11/25/14 1002 11/26/14 0335  WBC 7.9 9.0  NEUTROABS 5.6  --   HGB 15.1* 13.3  HCT 43.4 40.3  MCV 81.7 84.0  PLT 164 983   Basic Metabolic Panel  Recent Labs  11/25/14 1002 11/26/14 0335  NA 132* 140  K 4.3 3.9  CL 96 104  CO2 26 28  GLUCOSE 501* 273*  BUN 30* 29*  CREATININE 1.58* 1.67*  CALCIUM 9.1 8.7   Liver Function Tests  Recent Labs  11/25/14 1002  AST 27  ALT 26  ALKPHOS 170*  BILITOT 0.8  PROT 7.7  ALBUMIN 4.2   Hemoglobin A1C  Recent Labs  11/25/14 1010  HGBA1C 11.8*     Recent Labs  11/26/14 0338  TSH 0.196*    Radiology/Studies  Ct Head Wo Contrast  11/26/2014   CLINICAL DATA:  59 year old female with syncope  EXAM: CT HEAD WITHOUT CONTRAST  TECHNIQUE: Contiguous axial images were obtained from the base of the skull through the vertex without intravenous contrast.  COMPARISON:  Recent head CT 11/25/2014  FINDINGS: Negative for acute intracranial hemorrhage, acute infarction, mass, mass effect, hydrocephalus  or midline shift. Gray-white differentiation is preserved throughout. No focal soft tissue or calvarial abnormality. Globes and orbits are unremarkable. Stable appearance of chronic left mastoid effusion.  IMPRESSION: 1. No acute intracranial abnormality. 2. Stable chronic left mastoid effusion.   Electronically Signed   By: Jacqulynn Cadet M.D.   On: 11/26/2014 21:00    TELE: SR  ASSESSMENT AND PLAN  59 year old female with hypertensive urgency  Pending echocardiogram. Her last echo was in 2011. Had diastolic dysfunction and LVH with normal systolic function at that time. Recommend to increase nebivolol to 10 mg po daily.   Signed, Dorothy Spark MD, [facility] 11/27/2014

## 2014-11-27 NOTE — Progress Notes (Addendum)
Patient ID: Rachel Kim, female   DOB: [birth year], 59 y.o.   MRN: [id number] [facility] PROGRESS NOTE  AARIN SPARKMAN TDH:[id number] DOB: 1956-05-28 DOA: 11/25/2014 PCP: Jenny Reichmann, MD  Brief narrative:    59 y.o. pleasant female with history of uncontrolled type 2 diabetes, dyslipidemia, hypertension, hypothyroidism, CKD stage 3, non compliant with insulin and? Other medications. Pt presented to [facility] from urgent care [place] with headaches and hypertension. Pt reported temporal headache and then diffuse headache for past few days prior to this admission. Headache was 9/10 in intensity on admission. No associated blurred vision or vision loss.  On admission, blood pressure was as high as 215/163 which has improved to 189/86 with hydralazine 10 mg IV. Blood work was significant for glucose of 501, CBG 489,  creatinine 1.58 (baseline 1.7 in 09/2012). Urinalysis showed glucose of more than 1000. She was admitted to SDU due to accelerated blood pressure. Cardiology has seen the pt in consultation 11/26/14 and added nebivolol for better BP control.   Barrier to discharge: uncontrolled BP, 203/81. Will call cardio consult for input on BP management since pt BP fluctuates from 120's to 200's range.    Assessment/Plan:    Principal Problem: Headache / Accelerated hypertension - patient still has ongoing headache. This is likely from uncontrolled BP as well as OSA (She is using CPAP at bedtime).  - Headache is 3/10 after she was given 1 dose of morphine IV 1 mg. Added Fioricet to see if it helps.  - BP better with addition of nebivolool. SBP in 160's range. Appreciate cardio recommendations.  - Stable for transfer to telemetry floor.  Active Problems Hypothyroidism - TSH  0.196 on this admission. Reduced synthroid to 100 mcg daily instead of 150 mcg daily.   Mixed hyperlipidemia - Continue Pravachol 40 mg at bedtime   Morbid obesity - Body mass index is 43.58 kg/(m^2). - Counseled on  diet - Nutrition consulted  OSA on CPAP / acute on chronic respiratory failure with hypoxia - Respiratory status stable. - CPAP at bedtime   DM type 2, uncontrolled, with renal complications - M4W 80.3 on this admission indicating poor glycemic control - CBG's in past 24 hours: 312, 240, 245. - Per DM coordinator will increase Lantus from 22 units to 30 units daily and add novolog 4 units TID - Continue SSI - Hold metformin due to renal insufficiency.   Chronic kidney disease, stage 3 / Diabetic nephropathy - Baseline Cr 1.7 in 09/2012. On this admission creatinine is 1.5 and has trended up which could be due to benazepril and metformin both of which were held - Check BMP today    DVT Prophylaxis  - Lovenox subQ ordered while pt in hospital    Code Status: Full.  Family Communication:  plan of care discussed with the patient and her sister. Sister updated over the phone 11/27/2014. Disposition Plan: tranfer to telemetry floor today.   IV access:  Peripheral IV  Procedures and diagnostic studies:    Ct Head Wo Contrast 11/25/2014 No intracranial hemorrhage.  No CT evidence of large acute infarct.  Opacification left mastoid air cell and portion left middle ear cavity.    Medical Consultants:  Cardiology  Other Consultants:  Nutrition DM coordinator  IAnti-Infectives:   None    Leisa Lenz, MD  [facility] Pager [phone]  Time spent in minutes: 25 minutes  If 7PM-7AM, please contact night-coverage [email or web address] Password [email or web address] 11/27/2014, 11:19 AM   LOS: 2 days  HPI/Subjective: No acute overnight events. Patient reports headache, this am 6/10 in intensity. Relieved to 3/10 with morphine IV given earlier this am.   Objective: Filed Vitals:   11/27/14 0620 11/27/14 0800 11/27/14 0900 11/27/14 1019  BP: 177/121 146/52 150/60 162/75  Pulse:  83 83 75  Temp:  99 F (37.2 C)  98.5 F (36.9 C)  TempSrc:  Oral    Resp:  13 12 14   Height:      Weight:       SpO2:  93% 94% 95%    Intake/Output Summary (Last 24 hours) at 11/27/14 1119 Last data filed at 11/27/14 0353  Gross per 24 hour  Intake      0 ml  Output   1300 ml  Net  -1300 ml    Exam:   General:  Pt is alert,  not in acute distress  Cardiovascular: RRR, (+) S1,S2  Respiratory: no wheezing, no crackles   Abdomen: obese abd, non tender, soft, not distended, (+) BS  Extremities: pulses palpable, no leg swelling   Neuro: Nonfocal  Data Reviewed: Basic Metabolic Panel:  Recent Labs Lab 11/25/14 1002 11/26/14 0335  NA 132* 140  K 4.3 3.9  CL 96 104  CO2 26 28  GLUCOSE 501* 273*  BUN 30* 29*  CREATININE 1.58* 1.67*  CALCIUM 9.1 8.7   Liver Function Tests:  Recent Labs Lab 11/25/14 1002  AST 27  ALT 26  ALKPHOS 170*  BILITOT 0.8  PROT 7.7  ALBUMIN 4.2   No results for input(s): LIPASE, AMYLASE in the last 168 hours. No results for input(s): AMMONIA in the last 168 hours. CBC:  Recent Labs Lab 11/25/14 1002 11/26/14 0335  WBC 7.9 9.0  NEUTROABS 5.6  --   HGB 15.1* 13.3  HCT 43.4 40.3  MCV 81.7 84.0  PLT 164 167   Cardiac Enzymes: No results for input(s): CKTOTAL, CKMB, CKMBINDEX, TROPONINI in the last 168 hours. BNP: Invalid input(s): POCBNP CBG:  Recent Labs Lab 11/26/14 0745 11/26/14 1151 11/26/14 1611 11/26/14 2108 11/27/14 0733  GLUCAP 315* 338* 312* 240* 245*    Recent Results (from the past 240 hour(s))  MRSA PCR Screening     Status: None   Collection Time: 11/25/14  9:19 AM  Result Value Ref Range Status   MRSA by PCR NEGATIVE NEGATIVE Final     Scheduled Meds: . aspirin EC  81 mg Oral Daily  . benazepril  40 mg Oral BID  . enoxaparin (LOVENOX)   60 mg Subcutaneous Q24H  . fluticasone  2 spray Each Nare QHS  . insulin aspart  0-5 Units Subcutaneous QHS  . insulin aspart  0-9 Units Subcutaneous TID WC  . insulin glargine  22 Units Subcutaneous Daily  . levothyroxine  150 mcg Oral QAC breakfast  . magnesium  oxide  400 mg Oral BID  . pravastatin  40 mg Oral QPM

## 2014-11-27 NOTE — Progress Notes (Signed)
Echocardiogram 2D Echocardiogram has been performed.  Rachel Kim 11/27/2014, 1:37 PM

## 2014-11-27 NOTE — Progress Notes (Addendum)
Nursing leadership rounds this am: Pt became tearful, expressing anger that she still has a headache and frustration that no one is listening to her. She does not normally have a headache and it is on-going and constant at 7-8/10 on analog pain scale since admission 2 days ago. Listened and reassured medication ordered.Explained side effects of hypertension and kidney disease if not taking meds. Explained about $4 prescription plan available at [facility] if possible with her prescriptions. She is to ask MD about possible meds for this plan available for her diagnoses. Oral prn pain med, Oxy IR 5mg  given with IV Zofran for headache and nausea.

## 2014-11-28 ENCOUNTER — Other Ambulatory Visit: Payer: Self-pay | Admitting: Internal Medicine

## 2014-11-28 DIAGNOSIS — G44219 Episodic tension-type headache, not intractable: Secondary | ICD-10-CM | POA: Diagnosis not present

## 2014-11-28 DIAGNOSIS — I10 Essential (primary) hypertension: Secondary | ICD-10-CM | POA: Diagnosis not present

## 2014-11-28 DIAGNOSIS — E782 Mixed hyperlipidemia: Secondary | ICD-10-CM | POA: Diagnosis not present

## 2014-11-28 DIAGNOSIS — N183 Chronic kidney disease, stage 3 (moderate): Secondary | ICD-10-CM | POA: Diagnosis not present

## 2014-11-28 DIAGNOSIS — E1129 Type 2 diabetes mellitus with other diabetic kidney complication: Secondary | ICD-10-CM | POA: Diagnosis not present

## 2014-11-28 LAB — GLUCOSE, CAPILLARY
GLUCOSE-CAPILLARY: 299 mg/dL — AB (ref 70–99)
Glucose-Capillary: 244 mg/dL — ABNORMAL HIGH (ref 70–99)
Glucose-Capillary: 323 mg/dL — ABNORMAL HIGH (ref 70–99)

## 2014-11-28 MED ORDER — INSULIN GLARGINE 100 UNIT/ML SOLOSTAR PEN: 30.0000 [IU] | PEN_INJECTOR | Freq: Every day | SUBCUTANEOUS | Status: DC

## 2014-11-28 MED ORDER — INSULIN ASPART 100 UNIT/ML ~~LOC~~ SOLN: 0.0000 [IU] | Freq: Every day | SUBCUTANEOUS | Status: DC

## 2014-11-28 MED ORDER — INSULIN PEN NEEDLE 31G X 8 MM MISC: 1.0000 | Freq: Two times a day (BID) | Status: DC

## 2014-11-28 MED ORDER — INSULIN ASPART 100 UNIT/ML ~~LOC~~ SOLN: SUBCUTANEOUS | Status: DC

## 2014-11-28 MED ORDER — MIRABEGRON ER 50 MG PO TB24: 50.0000 mg | ORAL_TABLET | Freq: Every day | ORAL | Status: DC

## 2014-11-28 MED ORDER — ISOSORBIDE MONONITRATE ER 30 MG PO TB24: 30.0000 mg | ORAL_TABLET | Freq: Every day | ORAL | Status: DC

## 2014-11-28 MED ORDER — ASPIRIN 81 MG PO TABS: 81.0000 mg | ORAL_TABLET | Freq: Every day | ORAL | Status: AC

## 2014-11-28 MED ORDER — MOMETASONE FUROATE 50 MCG/ACT NA SUSP: 2.0000 | Freq: Every day | NASAL | Status: DC

## 2014-11-28 MED ORDER — HYDRALAZINE HCL 25 MG PO TABS: 25.0000 mg | ORAL_TABLET | Freq: Three times a day (TID) | ORAL | Status: DC

## 2014-11-28 MED ORDER — ESCITALOPRAM OXALATE 20 MG PO TABS: 30.0000 mg | ORAL_TABLET | Freq: Every day | ORAL | Status: DC

## 2014-11-28 MED ORDER — CLONAZEPAM 0.5 MG PO TABS: 0.1250 mg | ORAL_TABLET | Freq: Every evening | ORAL | Status: DC | PRN

## 2014-11-28 MED ORDER — LIRAGLUTIDE 18 MG/3ML ~~LOC~~ SOPN: 1.8000 mg | PEN_INJECTOR | Freq: Every day | SUBCUTANEOUS | Status: DC

## 2014-11-28 MED ORDER — PRAVASTATIN SODIUM 40 MG PO TABS: 40.0000 mg | ORAL_TABLET | Freq: Every evening | ORAL | Status: DC

## 2014-11-28 MED ORDER — NEBIVOLOL HCL 10 MG PO TABS: 10.0000 mg | ORAL_TABLET | Freq: Every day | ORAL | Status: DC

## 2014-11-28 MED ORDER — LEVOTHYROXINE SODIUM 100 MCG PO TABS: 100.0000 ug | ORAL_TABLET | Freq: Every day | ORAL | Status: DC

## 2014-11-28 MED ORDER — ISOSORBIDE MONONITRATE ER 30 MG PO TB24
30.0000 mg | ORAL_TABLET | Freq: Every day | ORAL | Status: DC
  Administered 2014-11-28: 30 mg via ORAL
  Filled 2014-11-28: qty 1

## 2014-11-28 NOTE — Progress Notes (Signed)
Report received from Virgina Norfolk, RN. No change from initial pm assessment. Will continue to monitor and follow the POC.

## 2014-11-28 NOTE — Discharge Instructions (Signed)

## 2014-11-28 NOTE — Discharge Summary (Addendum)
Physician Discharge Summary  Rachel Kim XLK:[id number] DOB: [birth year] DOA: 11/25/2014  PCP: Jenny Reichmann, MD  Admit date: 11/25/2014 Discharge date: 11/28/2014  Recommendations for Outpatient Follow-up:  1. Please note we have made changes to her blood pressure medications. Continue hydralazine, Imdur and Nebivolol as prescribed. 2. Please follow-up with primary care physician for regular blood pressure checks. 3. Please note we have made changes to her insulin regimen. Continue Lantus 30 units daily in addition to sliding scale insulin. Sliding scale insulin works in a way that while to check her CBGs with meals if your CBGs are  CBG 151 - 200: Give 2 units    CBG 201 - 250: Give 3 units    CBG 251 - 300: Give 5 units    CBG 301 - 350: Give 7 units    CBG 351 - 400: Give 9 units    3. Your kidney function was slightly above normal range. Creatinine was 1.78 prior to discharge. You were on Lotensin and metformin before which could have contributed to worsening kidney function. We stopped those medications. Please do not continue these medications from discharge. Have your primary care physician recheck your kidney function during next visit. 4. 2-D echo on this admission showed normal function.  Discharge Diagnoses:  Principal Problem:   Accelerated hypertension Active Problems:   Headache   Hypothyroidism   Mixed hyperlipidemia   Morbid obesity   OSA on CPAP   DM type 2, uncontrolled, with renal complications   CKD (chronic kidney disease) stage 3, GFR 30-59 ml/min   Acute on chronic respiratory failure with hypoxia    Discharge Condition: stable; patient insists on going home today.  Diet recommendation: as tolerated   History of present illness:  59 y.o. pleasant female with history of uncontrolled type 2 diabetes, dyslipidemia, hypertension, hypothyroidism, CKD stage 3, non compliant with insulin and? Other medications. Pt presented to [facility] from urgent care  [place] with headaches and hypertension. Pt reported temporal headache and then diffuse headache for past few days prior to this admission. Headache was 9/10 in intensity on admission. No associated blurred vision or vision loss.  On admission, blood pressure was as high as 215/163 which has improved to 189/86 with hydralazine 10 mg IV. Blood work was significant for glucose of 501, CBG 489, creatinine 1.58 (baseline 1.7 in 09/2012). Urinalysis showed glucose of more than 1000. She was admitted to SDU due to accelerated blood pressure. Cardiology has seen the pt in consultation 11/26/14 and added nebivolol for better BP control. Since blood pressure still in 170s range cardiology added imdur.   Assessment/Plan:    Principal Problem: Headache, episodic, tension type / Accelerated hypertension - Headache significantly better this morning. It is 2 out of 10. Most likely from uncontrolled blood pressure and obstructive sleep apnea. - Blood pressure is 146/52 to 170/84. Patient has history of noncompliance with blood pressure and other medications because of financial limitations. We have provided prescriptions for hydralazine, Imdur and Bystolic per cardiology recommendations. - Renal ultrasound is pending to rule out renal artery stenosis. I think the fluctuations in blood pressure likely because of long-standing noncompliance of at least 3 months prior to this admission.   Active Problems Hypothyroidism - TSH 0.196 on this admission. Reduced synthroid to 100 mcg daily.   Mixed hyperlipidemia - Continue Pravachol 40 mg at bedtime   Morbid obesity - Body mass index is 43.58 kg/(m^2). - Counseled on diet - Nutrition consulted  OSA on CPAP /  acute on chronic respiratory failure with hypoxia - Respiratory status stable. - CPAP at bedtime   DM type 2, uncontrolled, with renal complications - Q0G 86.7 on this admission indicating poor glycemic control - Patient prefers to be on previous  home regimen of Lantus and Victosa - Prescription provided for Lantus 30 units daily along with sliding scale insulin with instructions how to use sliding scale insulin. - Continue to hold metformin because of renal insufficiency.  Chronic kidney disease, stage 3 / Diabetic nephropathy - Baseline Cr 1.7 in 09/2012. On this admission creatinine is 1.5 and has trended up which could be due to benazepril and metformin both of which were held on this admission. - Creatinine is 1.78 which is around patient's baseline. - Patient instructed to have primary care physician recheck kidney function during next visit.  DVT Prophylaxis  - Lovenox subQ ordered while pt in hospital    Code Status: Full.  Family Communication: plan of care discussed with the patient and her sister. Sister updated over the phone 11/27/2014.  IV access:  Peripheral IV  Procedures and diagnostic studies:   Ct Head Wo Contrast 11/25/2014 No intracranial hemorrhage. No CT evidence of large acute infarct. Opacification left mastoid air cell and portion left middle ear cavity.   Medical Consultants:  Cardiology  Other Consultants:  Nutrition DM coordinator  IAnti-Infectives:   None     Signed:  Leisa Lenz, MD  [facility] 11/28/2014, 9:30 AM  Pager #: [phone]  Time spent in minutes: more than 30 minutes   Discharge Exam: Filed Vitals:   11/28/14 0520  BP: 170/84  Pulse: 69  Temp: 97.8 F (36.6 C)  Resp: 16   Filed Vitals:   11/27/14 1314 11/27/14 1717 11/27/14 2151 11/28/14 0520  BP: 163/73 193/87 170/86 170/84  Pulse: 72  60 69  Temp: 98.6 F (37 C)  98.2 F (36.8 C) 97.8 F (36.6 C)  TempSrc: Oral  Oral Oral  Resp: 16  16 16   Height:      Weight:    118.117 kg (260 lb 6.4 oz)  SpO2: 98%  99% 99%    General: Pt is alert, follows commands appropriately, not in acute distress Cardiovascular: Regular rate and rhythm, S1/S2 +, no murmurs Respiratory: Clear  to auscultation bilaterally, no wheezing, no crackles, no rhonchi Abdominal: Soft, non tender, non distended, bowel sounds +, no guarding Extremities: no edema, no cyanosis, pulses palpable bilaterally DP and PT Neuro: Grossly nonfocal  Discharge Instructions  Discharge Instructions    Call MD for:  difficulty breathing, headache or visual disturbances    Complete by:  As directed      Call MD for:  persistant nausea and vomiting    Complete by:  As directed      Call MD for:  severe uncontrolled pain    Complete by:  As directed      Diet - low sodium heart healthy    Complete by:  As directed      Discharge instructions    Complete by:  As directed   1. Please note we have made changes to her blood pressure medications. Continue hydralazine, Imdur and Nebivolol as prescribed. 2. Please follow-up with primary care physician for regular blood pressure checks. 3. Please note we have made changes to her insulin regimen. Continue Lantus 30 units daily in addition to sliding scale insulin. Sliding scale insulin works in a way that while to check her CBGs with meals if your CBGs  are  CBG 151 - 200: Give 2 units    CBG 201 - 250: Give 3 units    CBG 251 - 300: Give 5 units    CBG 301 - 350: Give 7 units    CBG 351 - 400: Give 9 units    3. Your kidney function was slightly above normal range. Creatinine was 1.78 prior to discharge. You were on Lotensin and metformin before which could have contributed to worsening kidney function. We stopped those medications. Please do not continue these medications from discharge. Have your primary care physician recheck your kidney function during next visit. 4. 2-D echo on this admission showed normal function.     Increase activity slowly    Complete by:  As directed             Medication List    STOP taking these medications        benazepril 20 MG tablet  Commonly known as:  LOTENSIN     benazepril 40 MG tablet  Commonly known  as:  LOTENSIN     indapamide 1.25 MG tablet  Commonly known as:  LOZOL     Insulin Pen Needle 31G X 8 MM Misc  Commonly known as:  B-D ULTRAFINE III SHORT PEN     metFORMIN 850 MG tablet  Commonly known as:  GLUCOPHAGE     Potassium 99 MG Tabs      TAKE these medications        aspirin 81 MG tablet  Take 1 tablet (81 mg total) by mouth daily.     clonazePAM 0.5 MG tablet  Commonly known as:  KLONOPIN  Take 0.5 tablets (0.25 mg total) by mouth at bedtime as needed for anxiety (sleep). Take 1/4 tablet as needed     escitalopram 20 MG tablet  Commonly known as:  LEXAPRO  Take 1.5 tablets (30 mg total) by mouth daily. Take 1 1/2 daily     hydrALAZINE 25 MG tablet  Commonly known as:  APRESOLINE  Take 1 tablet (25 mg total) by mouth every 8 (eight) hours.     insulin aspart 100 UNIT/ML injection  Commonly known as:  novoLOG  Inject 0-5 Units into the skin at bedtime.     insulin aspart 100 UNIT/ML injection  Commonly known as:  NOVOLOG  - CBG 151 - 200: 2 units    -  CBG 201 - 250: 3 units    -  CBG 251 - 300: 5 units    -  CBG 301 - 350: 7 units    -  CBG 351 - 400 9 units     Insulin Glargine 100 UNIT/ML Solostar Pen  Commonly known as:  LANTUS SOLOSTAR  Inject 30 Units into the skin daily.     isosorbide mononitrate 30 MG 24 hr tablet  Commonly known as:  IMDUR  Take 1 tablet (30 mg total) by mouth daily.     levothyroxine 100 MCG tablet  Commonly known as:  SYNTHROID, LEVOTHROID  Take 1 tablet (100 mcg total) by mouth daily before breakfast.     Liraglutide 18 MG/3ML Sopn  Commonly known as:  VICTOZA  Inject 0.3 mLs (1.8 mg total) into the skin daily.     Magnesium 250 MG Tabs  Take 1 tablet by mouth 2 (two) times daily.     mirabegron ER 50 MG Tb24 tablet  Commonly known as:  MYRBETRIQ  Take 1 tablet (50 mg total) by mouth at bedtime.  mometasone 50 MCG/ACT nasal spray  Commonly known as:  NASONEX  Place 2 sprays into the nose at  bedtime.     nebivolol 10 MG tablet  Commonly known as:  BYSTOLIC  Take 1 tablet (10 mg total) by mouth daily.     pravastatin 40 MG tablet  Commonly known as:  PRAVACHOL  Take 1 tablet (40 mg total) by mouth every evening.     PRESERVISION AREDS PO  Take 1 capsule by mouth 2 (two) times daily.     PROBIOTIC FORMULA PO  Take by mouth daily.     Vitamin D 2000 UNITS Caps  Take by mouth daily.           Follow-up Information    Schedule an appointment as soon as possible for a visit with DAUB, Lina Sayre, MD.   Specialty:  Family Medicine   Why:  Follow up appt after recent hospitalization   Contact information:   [place] [address] [phone]        The results of significant diagnostics from this hospitalization (including imaging, microbiology, ancillary and laboratory) are listed below for reference.    Significant Diagnostic Studies: Ct Head Wo Contrast  11/26/2014   CLINICAL DATA:  59 year old female with syncope  EXAM: CT HEAD WITHOUT CONTRAST  TECHNIQUE: Contiguous axial images were obtained from the base of the skull through the vertex without intravenous contrast.  COMPARISON:  Recent head CT 11/25/2014  FINDINGS: Negative for acute intracranial hemorrhage, acute infarction, mass, mass effect, hydrocephalus or midline shift. Gray-white differentiation is preserved throughout. No focal soft tissue or calvarial abnormality. Globes and orbits are unremarkable. Stable appearance of chronic left mastoid effusion.  IMPRESSION: 1. No acute intracranial abnormality. 2. Stable chronic left mastoid effusion.   Electronically Signed   By: Jacqulynn Cadet M.D.   On: 11/26/2014 21:00   Ct Head Wo Contrast  11/25/2014   CLINICAL DATA:  59 year old hypertensive diabetic female with hyperlipidemia and sarcoidosis presenting with nausea, vomiting and headache for the past 4 days. Initial encounter.  EXAM: CT HEAD WITHOUT CONTRAST  TECHNIQUE: Contiguous axial  images were obtained from the base of the skull through the vertex without intravenous contrast.  COMPARISON:  02/03/2009 CT.  FINDINGS: No intracranial hemorrhage.  No CT evidence of large acute infarct.  No hydrocephalus.  No intracranial mass lesion noted on this unenhanced exam.  Opacification left mastoid air cells and portion of the left middle ear cavity. No obvious obstructing lesion of the eustachian tube although the posterior superior nasopharynx was not imaged on the current exam.  Minimal partial opacification left sphenoid sinus air cell.  Mild exophthalmos.  IMPRESSION: No intracranial hemorrhage.  No CT evidence of large acute infarct.  Opacification left mastoid air cell and portion left middle ear cavity.  Please see above.   Electronically Signed   By: Genia Del M.D.   On: 11/25/2014 10:23    Microbiology: Recent Results (from the past 240 hour(s))  MRSA PCR Screening     Status: None   Collection Time: 11/25/14  9:19 AM  Result Value Ref Range Status   MRSA by PCR NEGATIVE NEGATIVE Final    Comment:        The GeneXpert MRSA Assay (FDA approved for NASAL specimens only), is one component of a comprehensive MRSA colonization surveillance program. It is not intended to diagnose MRSA infection nor to guide or monitor treatment for MRSA infections.      Labs: Basic  Metabolic Panel:  Recent Labs Lab 11/25/14 1002 11/26/14 0335 11/27/14 1222  NA 132* 140 134*  K 4.3 3.9 4.7  CL 96 104 98  CO2 26 28 28   GLUCOSE 501* 273* 309*  BUN 30* 29* 28*  CREATININE 1.58* 1.67* 1.78*  CALCIUM 9.1 8.7 8.6   Liver Function Tests:  Recent Labs Lab 11/25/14 1002  AST 27  ALT 26  ALKPHOS 170*  BILITOT 0.8  PROT 7.7  ALBUMIN 4.2   No results for input(s): LIPASE, AMYLASE in the last 168 hours. No results for input(s): AMMONIA in the last 168 hours. CBC:  Recent Labs Lab 11/25/14 1002 11/26/14 0335  WBC 7.9 9.0  NEUTROABS 5.6  --   HGB 15.1* 13.3  HCT 43.4  40.3  MCV 81.7 84.0  PLT 164 167   Cardiac Enzymes: No results for input(s): CKTOTAL, CKMB, CKMBINDEX, TROPONINI in the last 168 hours. BNP: BNP (last 3 results) No results for input(s): BNP in the last 8760 hours.  ProBNP (last 3 results) No results for input(s): PROBNP in the last 8760 hours.  CBG:  Recent Labs Lab 11/26/14 2108 11/27/14 0733 11/27/14 1100 11/27/14 1708 11/27/14 2117  GLUCAP 240* 245* 209* 236* 191*

## 2014-11-28 NOTE — Progress Notes (Signed)
*  PRELIMINARY RESULTS* Vascular Ultrasound Renal Artery Duplex has been completed.   Study was technically limited and difficult due to patient body habitus. There is no obvious evidence of hemodynamically significant renal artery stenosis >60% bilaterally.  11/28/2014 3:27 PM Maudry Mayhew, RVT, RDCS, RDMS  Oda Cogan, RVT, RDMS

## 2014-11-28 NOTE — Progress Notes (Signed)
Went over all discharge information with pt.  All questions answered.  Went over importance of taking medications as prescribed.  Showed pt how to inject insulin.  VSS.  Pt will be wheeled out by NT.

## 2014-11-28 NOTE — Progress Notes (Signed)
Patient Name: Rachel Kim Date of Encounter: 11/28/2014  Principal Problem:   Accelerated hypertension Active Problems:   Hypothyroidism   Mixed hyperlipidemia   Morbid obesity   OSA on CPAP   DM type 2, uncontrolled, with renal complications   Headache   CKD (chronic kidney disease) stage 3, GFR 30-59 ml/min   Acute on chronic respiratory failure with hypoxia   Length of Stay: 3  SUBJECTIVE  The patient feels significantly better today.   CURRENT MEDS . aspirin EC  81 mg Oral Daily  . enoxaparin (LOVENOX) injection  60 mg Subcutaneous Q24H  . fluticasone  2 spray Each Nare QHS  . hydrALAZINE  25 mg Oral 3 times per day  . insulin aspart  0-5 Units Subcutaneous QHS  . insulin aspart  0-9 Units Subcutaneous TID WC  . insulin aspart  4 Units Subcutaneous TID WC  . insulin glargine  30 Units Subcutaneous Daily  . levothyroxine  100 mcg Oral QAC breakfast  . magnesium oxide  400 mg Oral BID  . nebivolol  10 mg Oral Daily  . pravastatin  40 mg Oral QPM  . sodium chloride  3 mL Intravenous Q12H   OBJECTIVE  Filed Vitals:   11/27/14 1314 11/27/14 1717 11/27/14 2151 11/28/14 0520  BP: 163/73 193/87 170/86 170/84  Pulse: 72  60 69  Temp: 98.6 F (37 C)  98.2 F (36.8 C) 97.8 F (36.6 C)  TempSrc: Oral  Oral Oral  Resp: 16  16 16   Height:      Weight:    260 lb 6.4 oz (118.117 kg)  SpO2: 98%  99% 99%    Intake/Output Summary (Last 24 hours) at 11/28/14 0812 Last data filed at 11/28/14 0755  Gross per 24 hour  Intake   1135 ml  Output   2300 ml  Net  -1165 ml   Filed Weights   11/25/14 1808 11/26/14 0342 11/28/14 0520  Weight: 258 lb 9.6 oz (117.3 kg) 261 lb 14.5 oz (118.8 kg) 260 lb 6.4 oz (118.117 kg)    PHYSICAL EXAM  General: Pleasant, NAD. Neuro: Alert and oriented X 3. Moves all extremities spontaneously. Psych: Normal affect. HEENT:  Normal  Neck: Supple without bruits or JVD. Lungs:  Resp regular and unlabored, CTA. Heart: RRR no s3, s4, or  murmurs. Abdomen: Soft, non-tender, non-distended, BS + x 4.  Extremities: No clubbing, cyanosis or edema. DP/PT/Radials 2+ and equal bilaterally.  Accessory Clinical Findings  CBC  Recent Labs  11/25/14 1002 11/26/14 0335  WBC 7.9 9.0  NEUTROABS 5.6  --   HGB 15.1* 13.3  HCT 43.4 40.3  MCV 81.7 84.0  PLT 164 701   Basic Metabolic Panel  Recent Labs  11/26/14 0335 11/27/14 1222  NA 140 134*  K 3.9 4.7  CL 104 98  CO2 28 28  GLUCOSE 273* 309*  BUN 29* 28*  CREATININE 1.67* 1.78*  CALCIUM 8.7 8.6   Liver Function Tests  Recent Labs  11/25/14 1002  AST 27  ALT 26  ALKPHOS 170*  BILITOT 0.8  PROT 7.7  ALBUMIN 4.2   Hemoglobin A1C  Recent Labs  11/25/14 1010  HGBA1C 11.8*     Recent Labs  11/26/14 0338  TSH 0.196*    Radiology/Studies  Ct Head Wo Contrast  11/26/2014   CLINICAL DATA:  59 year old female with syncope  EXAM: CT HEAD WITHOUT CONTRAST  TECHNIQUE: Contiguous axial images were obtained from the base of the skull  through the vertex without intravenous contrast.  COMPARISON:  Recent head CT 11/25/2014  FINDINGS: Negative for acute intracranial hemorrhage, acute infarction, mass, mass effect, hydrocephalus or midline shift. Gray-white differentiation is preserved throughout. No focal soft tissue or calvarial abnormality. Globes and orbits are unremarkable. Stable appearance of chronic left mastoid effusion.  IMPRESSION: 1. No acute intracranial abnormality. 2. Stable chronic left mastoid effusion.   Electronically Signed   By: Jacqulynn Cadet M.D.   On: 11/26/2014 21:00   ECHO: Left ventricle: The cavity size was normal. There was moderate concentric hypertrophy. Systolic function was normal. The estimated ejection fraction was in the range of 60% to 65%. Wall motion was normal; there were no regional wall motion abnormalities. There was an increased relative contribution of atrial contraction to ventricular filling. Doppler  parameters are consistent with abnormal left ventricular relaxation (grade 1 diastolic dysfunction). - Aortic valve: Trileaflet; normal thickness, mildly calcified leaflets. - Mitral valve: There was mild regurgitation.  TELE: SR    ASSESSMENT AND PLAN  59 year old female with hypertensive urgency  Pending echocardiogram. Her last echo was in 2011. Echo shows normal LVEF, moderate concentric LVH and impaired relaxation. BP still uncontrolled, unable to add ACEI/ARB with CKD stage 3,  I would add imdur 30 mg po daily to her regimen. I would order renal arterial US to rule out stenosis as a cause of hypertension. Low TSH, order free T3 and T4.   If her BP better controlled this pm, she can go home.  Signed, Dorothy Spark MD, [facility] 11/28/2014

## 2014-11-29 ENCOUNTER — Encounter (HOSPITAL_COMMUNITY): Payer: Self-pay | Admitting: Nurse Practitioner

## 2014-11-29 ENCOUNTER — Emergency Department (HOSPITAL_COMMUNITY)
Admission: EM | Admit: 2014-11-29 | Discharge: 2014-11-29 | Disposition: A | Discharge status: Home / Self Care | Payer: 59 | Attending: Emergency Medicine | Admitting: Emergency Medicine

## 2014-11-29 DIAGNOSIS — I10 Essential (primary) hypertension: Secondary | ICD-10-CM

## 2014-11-29 DIAGNOSIS — F329 Major depressive disorder, single episode, unspecified: Secondary | ICD-10-CM | POA: Insufficient documentation

## 2014-11-29 DIAGNOSIS — N183 Chronic kidney disease, stage 3 (moderate): Secondary | ICD-10-CM | POA: Insufficient documentation

## 2014-11-29 DIAGNOSIS — I129 Hypertensive chronic kidney disease with stage 1 through stage 4 chronic kidney disease, or unspecified chronic kidney disease: Secondary | ICD-10-CM | POA: Insufficient documentation

## 2014-11-29 DIAGNOSIS — Z862 Personal history of diseases of the blood and blood-forming organs and certain disorders involving the immune mechanism: Secondary | ICD-10-CM | POA: Insufficient documentation

## 2014-11-29 DIAGNOSIS — E039 Hypothyroidism, unspecified: Secondary | ICD-10-CM | POA: Insufficient documentation

## 2014-11-29 DIAGNOSIS — G4730 Sleep apnea, unspecified: Secondary | ICD-10-CM | POA: Insufficient documentation

## 2014-11-29 DIAGNOSIS — Z7982 Long term (current) use of aspirin: Secondary | ICD-10-CM | POA: Insufficient documentation

## 2014-11-29 DIAGNOSIS — Z794 Long term (current) use of insulin: Secondary | ICD-10-CM | POA: Insufficient documentation

## 2014-11-29 DIAGNOSIS — Z79899 Other long term (current) drug therapy: Secondary | ICD-10-CM | POA: Insufficient documentation

## 2014-11-29 DIAGNOSIS — Z8701 Personal history of pneumonia (recurrent): Secondary | ICD-10-CM | POA: Insufficient documentation

## 2014-11-29 DIAGNOSIS — Z9981 Dependence on supplemental oxygen: Secondary | ICD-10-CM | POA: Insufficient documentation

## 2014-11-29 DIAGNOSIS — E1165 Type 2 diabetes mellitus with hyperglycemia: Secondary | ICD-10-CM | POA: Insufficient documentation

## 2014-11-29 DIAGNOSIS — E785 Hyperlipidemia, unspecified: Secondary | ICD-10-CM | POA: Insufficient documentation

## 2014-11-29 DIAGNOSIS — K219 Gastro-esophageal reflux disease without esophagitis: Secondary | ICD-10-CM | POA: Insufficient documentation

## 2014-11-29 DIAGNOSIS — R739 Hyperglycemia, unspecified: Secondary | ICD-10-CM

## 2014-11-29 DIAGNOSIS — F419 Anxiety disorder, unspecified: Secondary | ICD-10-CM | POA: Insufficient documentation

## 2014-11-29 LAB — CBG MONITORING, ED
Glucose-Capillary: 408 mg/dL — ABNORMAL HIGH (ref 70–99)
Glucose-Capillary: 281 mg/dL — ABNORMAL HIGH (ref 70–99)

## 2014-11-29 LAB — T3, FREE: T3, Free: 1.3 pg/mL — ABNORMAL LOW (ref 2.0–4.4)

## 2014-11-29 LAB — BASIC METABOLIC PANEL
Anion gap: 10 (ref 5–15)
BUN: 35 mg/dL — ABNORMAL HIGH (ref 6–23)
CO2: 25 mmol/L (ref 19–32)
Calcium: 8.6 mg/dL (ref 8.4–10.5)
Chloride: 103 mmol/L (ref 96–112)
Creatinine, Ser: 1.63 mg/dL — ABNORMAL HIGH (ref 0.50–1.10)
GFR calc Af Amer: 39 mL/min — ABNORMAL LOW (ref >90)
GFR calc non Af Amer: 34 mL/min — ABNORMAL LOW (ref >90)
Glucose, Bld: 484 mg/dL — ABNORMAL HIGH (ref 70–99)
Potassium: 4.1 mmol/L (ref 3.5–5.1)
Sodium: 138 mmol/L (ref 135–145)

## 2014-11-29 MED ORDER — INSULIN ASPART 100 UNIT/ML ~~LOC~~ SOLN
9.0000 [IU] | Freq: Once | SUBCUTANEOUS | Status: AC
  Administered 2014-11-29: 9 [IU] via INTRAVENOUS
  Filled 2014-11-29: qty 1

## 2014-11-29 MED ORDER — SODIUM CHLORIDE 0.9 % IV BOLUS (SEPSIS)
1000.0000 mL | Freq: Once | INTRAVENOUS | Status: AC
  Administered 2014-11-29: 1000 mL via INTRAVENOUS

## 2014-11-29 MED ORDER — NEBIVOLOL HCL 10 MG PO TABS
10.0000 mg | ORAL_TABLET | Freq: Every day | ORAL | Status: DC
  Administered 2014-11-29: 10 mg via ORAL
  Filled 2014-11-29: qty 1

## 2014-11-29 NOTE — Discharge Instructions (Signed)
Please continue to monitor her blood pressure and blood sugar. Please return to emergency room if any concerning signs or symptoms present. Please follow-up with your primary care provider is for further evaluation and management of ongoing chronic conditions.

## 2014-11-29 NOTE — ED Notes (Signed)
Bed: WA20 Expected date: 11/29/14 Expected time: 7:13 PM Means of arrival: Ambulance Comments: Hyperglycemia HTN

## 2014-11-29 NOTE — ED Provider Notes (Signed)
CSN: [id number]     Arrival date & time 11/29/14  1926 History   First MD Initiated Contact with Patient 11/29/14 1927     Chief Complaint  Patient presents with  . Hyperglycemia  . Hypertension   HPI   59 year old female presents today with hyperglycemia. Patient reports that she was released from the hospital late last night after being hospitalized for hyperglycemia and hypertension. Patient reports that she was able to fill most of her at home medications with the exception of Lantus, Bistolic, Victoza, NovoLog. She states that she was unable to fill them due to financial reasons. Patient reports that she attempted to go to urgent care today to receive medications but they were close. She called 911 for advice for medication, they sent an ambulance who recommended she be seen in the emergency room. Patient reports that her blood sugar was in the low 400s when she took it today, she denies any associated symptoms including blurred vision, headache, chest pain, palpitations, shortness of breath, diaphoresis increased thirst or urination, abdominal pain, changes in her urinary color clarity or frequency. Patient does note she has a tingling sensation in her left distal hand. Patient has no additional complaints in addition to those stated above.   Past Medical History  Diagnosis Date  . Anxiety   . Depression   . Diabetes mellitus   . GERD (gastroesophageal reflux disease)   . Hyperlipidemia   . Hypertension   . Thyroid disease     hypothyroidism  . Sleep apnea     uses CPAP  . Sarcoidosis of lung   . Chronic kidney disease (CKD), stage III (moderate)   . RBBB   . LVH (left ventricular hypertrophy)     echo 02/25/10 EF >55%  . High cholesterol   . Hyperthyroidism     diagnosed 1989  . H/O breast biopsy 02/10/10    calcification , no tumors  . OSA (obstructive sleep apnea)     severe complex apnea  . CSA (central sleep apnea)   . Pneumonia 04/04   Past Surgical History   Procedure Laterality Date  . Tonsillectomy    . Appendectomy    . Retroperitoneal lymph node excision      right underarm  . Elbow fracture surgery      with pins and pins removed  . Rhinoplasty      has had several revisions  . Tubinates trimmed    . Laminectomy l5, s1    . Carpal tunnel release      right and left  . Rt knee arthroscopy    . Cholecystectomy    . Laminectomy c5-c6    . Abdominal hysterectomy  12/07   Family History  Problem Relation Age of Onset  . Colitis Father   . Thyroid disease Father   . Colon cancer Maternal Grandmother   . Hypertension Mother   . Stroke Mother   . Thyroid disease Mother   . Thyroid disease Sister   . Thyroid disease Brother   . Thyroid disease Sister   . Thyroid disease Brother    History  Substance Use Topics  . Smoking status: Never Smoker   . Smokeless tobacco: Never Used  . Alcohol Use: No   OB History    Gravida Para Term Preterm AB TAB SAB Ectopic Multiple Living   0 0 0 0 0 0 0 0 0 0      Review of Systems  All other systems reviewed and are negative.  Allergies  Doxazosin; Labetalol; Actos; Amlodipine; Cozaar; Glipizide; and Clindamycin hcl  Home Medications   Prior to Admission medications   Medication Sig Start Date End Date Taking? Authorizing Provider  aspirin 81 MG tablet Take 1 tablet (81 mg total) by mouth daily. 11/28/14  Yes Robbie Lis, MD  Cholecalciferol (VITAMIN D) 2000 UNITS CAPS Take by mouth daily.     Yes Historical Provider, MD  clonazePAM (KLONOPIN) 0.5 MG tablet Take 0.5 tablets (0.25 mg total) by mouth at bedtime as needed for anxiety (sleep). Take 1/4 tablet as needed 11/28/14  Yes Robbie Lis, MD  escitalopram (LEXAPRO) 20 MG tablet Take 1.5 tablets (30 mg total) by mouth daily. Take 1 1/2 daily 11/28/14  Yes Robbie Lis, MD  hydrALAZINE (APRESOLINE) 25 MG tablet Take 1 tablet (25 mg total) by mouth every 8 (eight) hours. 11/28/14  Yes Robbie Lis, MD  isosorbide mononitrate  (IMDUR) 30 MG 24 hr tablet Take 1 tablet (30 mg total) by mouth daily. 11/28/14  Yes Robbie Lis, MD  Magnesium 250 MG TABS Take 1 tablet by mouth 2 (two) times daily.    Yes Historical Provider, MD  mirabegron ER (MYRBETRIQ) 50 MG TB24 tablet Take 1 tablet (50 mg total) by mouth at bedtime. 11/28/14  Yes Robbie Lis, MD  mometasone (NASONEX) 50 MCG/ACT nasal spray Place 2 sprays into the nose at bedtime. Patient taking differently: Place 2 sprays into the nose 3 times/day as needed-between meals & bedtime (allergies).  11/28/14  Yes Robbie Lis, MD  Multiple Vitamins-Minerals (PRESERVISION AREDS 2 PO) Take 1 tablet by mouth 2 (two) times daily.   Yes Historical Provider, MD  pravastatin (PRAVACHOL) 40 MG tablet Take 1 tablet (40 mg total) by mouth every evening. 11/28/14  Yes Robbie Lis, MD  Probiotic Product (PROBIOTIC FORMULA PO) Take by mouth daily.     Yes Historical Provider, MD  insulin aspart (NOVOLOG) 100 UNIT/ML injection Inject 0-5 Units into the skin at bedtime. Patient not taking: Reported on 11/29/2014 11/28/14   Robbie Lis, MD  insulin aspart (NOVOLOG) 100 UNIT/ML injection  CBG 151 - 200: 2 units    CBG 201 - 250: 3 units    CBG 251 - 300: 5 units    CBG 301 - 350: 7 units    CBG 351 - 400 9 units Patient not taking: Reported on 11/29/2014 11/28/14   Robbie Lis, MD  Insulin Glargine (LANTUS SOLOSTAR) 100 UNIT/ML Solostar Pen Inject 30 Units into the skin daily. Patient not taking: Reported on 11/29/2014 11/28/14   Robbie Lis, MD  Insulin Pen Needle (B-D ULTRAFINE III SHORT PEN) 31G X 8 MM MISC Inject 1 each into the skin 2 (two) times daily. Patient not taking: Reported on 11/29/2014 11/28/14   Ritta Slot, NP  levothyroxine (SYNTHROID, LEVOTHROID) 100 MCG tablet Take 1 tablet (100 mcg total) by mouth daily before breakfast. Patient not taking: Reported on 11/29/2014 11/28/14   Robbie Lis, MD  Liraglutide (VICTOZA) 18 MG/3ML SOPN Inject 0.3 mLs (1.8 mg total)  into the skin daily. Patient not taking: Reported on 11/29/2014 11/28/14   Robbie Lis, MD  nebivolol (BYSTOLIC) 10 MG tablet Take 1 tablet (10 mg total) by mouth daily. Patient not taking: Reported on 11/29/2014 11/28/14   Robbie Lis, MD   BP 168/79 mmHg  Pulse 73  Temp(Src) 98 F (36.7 C) (Oral)  Resp 18  SpO2 97%  LMP 07/01/2006  Physical Exam  Constitutional: She is oriented to person, place, and time. She appears well-developed and well-nourished.  HENT:  Head: Normocephalic and atraumatic.  Eyes: Pupils are equal, round, and reactive to light.  Neck: Normal range of motion. Neck supple. No JVD present. No tracheal deviation present. No thyromegaly present.  Cardiovascular: Regular rhythm, normal heart sounds and intact distal pulses.  Exam reveals no gallop and no friction rub.   No murmur heard. Pulmonary/Chest: Effort normal and breath sounds normal. No stridor. No respiratory distress. She has no wheezes. She has no rales. She exhibits no tenderness.  Abdominal: Soft. There is no tenderness. There is no rebound and no guarding.  Musculoskeletal: Normal range of motion.  Lymphadenopathy:    She has no cervical adenopathy.  Neurological: She is alert and oriented to person, place, and time. Coordination normal.  Skin: Skin is warm and dry.  Psychiatric: She has a normal mood and affect. Her behavior is normal. Judgment and thought content normal.  Nursing note and vitals reviewed.   ED Course  Procedures (including critical care time) Labs Review Labs Reviewed  BASIC METABOLIC PANEL - Abnormal; Notable for the following:    Glucose, Bld 484 (*)    BUN 35 (*)    Creatinine, Ser 1.63 (*)    GFR calc non Af Amer 34 (*)    GFR calc Af Amer 39 (*)    All other components within normal limits  CBG MONITORING, ED - Abnormal; Notable for the following:    Glucose-Capillary 408 (*)    All other components within normal limits  CBG MONITORING, ED - Abnormal; Notable for the  following:    Glucose-Capillary 281 (*)    All other components within normal limits   BUN  Date Value Ref Range Status  11/29/2014 35* 6 - 23 mg/dL Final  11/27/2014 28* 6 - 23 mg/dL Final  11/26/2014 29* 6 - 23 mg/dL Final  11/25/2014 30* 6 - 23 mg/dL Final   CREAT  Date Value Ref Range Status  10/26/2013 1.70* 0.50 - 1.10 mg/dL Final  06/11/2013 1.64* 0.50 - 1.10 mg/dL Final  04/28/2013 1.78* 0.50 - 1.10 mg/dL Final  10/09/2012 1.75* 0.50 - 1.10 mg/dL Final   CREATININE, SER  Date Value Ref Range Status  11/29/2014 1.63* 0.50 - 1.10 mg/dL Final  11/27/2014 1.78* 0.50 - 1.10 mg/dL Final  11/26/2014 1.67* 0.50 - 1.10 mg/dL Final  11/25/2014 1.58* 0.50 - 1.10 mg/dL Final       Imaging Review No results found.   EKG Interpretation   Date/Time:  Saturday November 29 2014 19:44:10 EDT Ventricular Rate:  71 PR Interval:  166 QRS Duration: 150 QT Interval:  466 QTC Calculation: 506 R Axis:   -61 Text Interpretation:  Sinus rhythm Consider left atrial enlargement RBBB  and LAFB Left ventricular hypertrophy No significant change was found  Confirmed by CAMPOS  MD, KEVIN ([id number]) on 12/01/2014 12:36:44 AM      MDM   Final diagnoses:  Hyperglycemia  Essential hypertension   Labs: BMP, CBG to 408/281, BMP no significant findings  Imaging: None  Consults: None  Therapeutics: NovoLog 9 units, normal saline  Assessment: Hyperglycemia, hypertension, elevated creatinine  Plan: Patient presents with hyperglycemia with no associated symptoms. She was recently admitted for hypoglycemia and hypotension with complete workup including renal artery studies, echo, cardiology consult. Medications were started. Patient was released last night reports she was able to fill a majority of her medications with the exception of  one blood pressure and her diabetic medications. She reports that she checked her blood sugar earlier in the day was elevated and attempted care at urgent care.  Upon arrival patient continued to deny complaints in addition to elevated CBG and distal left finger tingling. Labs are reassuring with a anion gap of 10, normal electrolytes, CO2 25. Patient was given a dose of NovoLog, and blood pressure medication. Patient's blood pressure continues to be sporadic, upon chart review this is normal for her. Patient was instructed to monitor fluid intake, take her prescribed medications as directed. Case management was contacted for assistance with home medications. Patient was given strict return precautions and the event that she was unable to obtain her medications, or new or worsening signs or symptoms presented. Patient understood and agreed to today's plan, had no further questions at time of discharge. Patient assured her follow-up evaluation.      Okey Regal, PA-C 12/01/14 [place], MD 12/02/14 [phone]

## 2014-11-29 NOTE — ED Notes (Signed)
Pt presents from home c/o elevated cbg, elevated b/p and numbness in her right arm. She brings with her, recent discharge paper from her what she states is 4 days inpatient admission for diabetes, adds that she has not been able to get her Rx insulin due to the high ($300) copay.She is denying any other pain, AOx4.

## 2014-12-01 ENCOUNTER — Telehealth: Payer: Self-pay | Admitting: Cardiovascular Disease

## 2014-12-01 ENCOUNTER — Ambulatory Visit (INDEPENDENT_AMBULATORY_CARE_PROVIDER_SITE_OTHER): Payer: 59 | Admitting: Physician Assistant

## 2014-12-01 VITALS — BP 194/104 | HR 72 | Temp 97.9°F | Resp 18 | Ht 64.25 in | Wt 268.2 lb

## 2014-12-01 DIAGNOSIS — N183 Chronic kidney disease, stage 3 unspecified: Secondary | ICD-10-CM

## 2014-12-01 DIAGNOSIS — J302 Other seasonal allergic rhinitis: Secondary | ICD-10-CM | POA: Diagnosis not present

## 2014-12-01 DIAGNOSIS — I10 Essential (primary) hypertension: Secondary | ICD-10-CM

## 2014-12-01 DIAGNOSIS — Z9989 Dependence on other enabling machines and devices: Secondary | ICD-10-CM

## 2014-12-01 DIAGNOSIS — IMO0002 Reserved for concepts with insufficient information to code with codable children: Secondary | ICD-10-CM

## 2014-12-01 DIAGNOSIS — E1129 Type 2 diabetes mellitus with other diabetic kidney complication: Secondary | ICD-10-CM | POA: Diagnosis not present

## 2014-12-01 DIAGNOSIS — E1165 Type 2 diabetes mellitus with hyperglycemia: Secondary | ICD-10-CM

## 2014-12-01 DIAGNOSIS — G4733 Obstructive sleep apnea (adult) (pediatric): Secondary | ICD-10-CM

## 2014-12-01 DIAGNOSIS — R3915 Urgency of urination: Secondary | ICD-10-CM

## 2014-12-01 MED ORDER — OXYBUTYNIN CHLORIDE 5 MG PO TABS: 5.0000 mg | ORAL_TABLET | Freq: Two times a day (BID) | ORAL | Status: DC

## 2014-12-01 MED ORDER — MOMETASONE FUROATE 50 MCG/ACT NA SUSP: 2.0000 | Freq: Every day | NASAL | Status: DC

## 2014-12-01 NOTE — Progress Notes (Signed)
Subjective:    Patient ID: Rachel Kim, female    DOB: [birth year], 59 y.o.   MRN: [id number]  HPI  Pt presents to clinic for help with her medications.  She was hospitalized for her HTN and DM and she was discharged 2 days ago.  Her new insurance does not cover some of her chronic medications and she has been out since she was released from the hospital and she is here tonight to help with this problem.  Her co-pay for Lantus, Bystolic and Victozia are all >$100.  Her myrbetriq, novolog and nasonex are not covered by her insurance.  She has not been on any of these medications since she left the hospital.  She is planning on contacting the drug manufactures to see if they have help for her.  Her insurance is a Hospital doctor that does not allow her to use many coupons.  She is worried that she is going to go back into the hospital if she does not get these medications.  She is back to work and she feels ok.  She went without pay (she works for a [employer], [employer]) while she was in the hospital.    Review of Systems     Objective:   Physical Exam  Constitutional: She appears well-developed and well-nourished.  BP 194/104 mmHg  Pulse 72  Temp(Src) 97.9 F (36.6 C) (Oral)  Resp 18  Ht 5' 4.25" (1.632 m)  Wt 268 lb 3.2 oz (121.655 kg)  BMI 45.68 kg/m2  SpO2 98%  LMP 07/01/2006   HENT:  Head: Normocephalic and atraumatic.  Right Ear: External ear normal.  Left Ear: External ear normal.  Pulmonary/Chest: Effort normal.  Skin: Skin is warm and dry.  Psychiatric: She has a normal mood and affect. Her behavior is normal. Judgment and thought content normal.      Assessment & Plan:  Urinary urgency - She will contact her urologist to see if this medication will be an ok alternative to the one she is currently on - I discussed with her that her SE will be worse on the medication - she will see if her urologist has samples of the Myrbetriq which she uses to allow her to sleep all night with her  CPAP vs getting up many times to use the restroom.  Plan: oxybutynin (DITROPAN) 5 MG tablet  Seasonal allergies - Plan: mometasone (NASONEX) 50 MCG/ACT nasal spray - if this is not covered with her insurance plan she will get Nasacort OTC.  Accelerated hypertension - uncontrolled tonight but she has not been on all her medications.  She should monitor this at home and if she develops headaches or vision changes she will seek medication treatment immediately.  She will contact her cardiologist for possible samples of Bystolic.  DM type 2, uncontrolled, with renal complications - I was able to give the patient 1 Lantus pen and 1 Novolog pen here tonight.  I will contact the Lantus and Victoza rep to see if I can get the patient any samples until she is able to contact the companies for assistance.  CKD (chronic kidney disease) stage 3, GFR 30-59 ml/min  OSA on CPAP  She has f/u planned for later this week for her hospital f/u.  I hope that her HTN and her glucose are under better control for that visit due to her being on her medications.  Windell Hummingbird PA-C  [facility] 12/01/2014 9:13 PM

## 2014-12-01 NOTE — Telephone Encounter (Signed)
Pt would like some samples of Bystolic 10 mg please.. Pt was put on this while she was in the hospital.

## 2014-12-01 NOTE — Patient Instructions (Addendum)
Got to [facility] outpatient pharmacy for cheaper medications.  www.pparx.org - for medication help through the companies  Goodrx.com - coupons for people without insurance  For your urine problems-- I wrote a Rx for oxybutynin instead of Myrbetriq (but please call your urologist 1st to make sure it is ok with him)  If your insurance will not cover nasonex - you can get Nasacort AQ is OTC

## 2014-12-01 NOTE — Progress Notes (Addendum)
EDCM left voice mail for patient regarding medication assistance with cal back number.  Unfortunately, due to patient having insurance, EDCM unable to assist with cost but will offer suggestions which may benefit the patient.  No further EDCM needs at this time.  12/01/2014 A. Nirvi Boehler RNCM 1650pm EDCM received phone call from patient.  Patient reports she is going to Urgent Care this evening for insulin.  Patient also reports she has called her cardiologist for samples of bystolic. Patient also reports she will be contacting [employer] for assistance with Lantus.  EDCM offered patient website [facility] for assistance with novalog insulin.  [facility] encouraged patient to call manufacturers of prescriptions she needs assistance with and to ask pcp for samples.  Also suggested having pcp change insulin to something she can afford.  Patient reports she has been on same insulin regimine and prefers to stay on Lantus and Victoza.  Patient thankful for phone call.  No further EDCM needs at this time.

## 2014-12-02 MED ORDER — NEBIVOLOL HCL 10 MG PO TABS: 10.0000 mg | ORAL_TABLET | Freq: Every day | ORAL | Status: DC

## 2014-12-02 NOTE — Telephone Encounter (Signed)
Left message on answer machine - samples available Patient needs an office appointment as well.

## 2014-12-08 ENCOUNTER — Encounter: Payer: Self-pay | Admitting: Physician Assistant

## 2014-12-08 ENCOUNTER — Ambulatory Visit (INDEPENDENT_AMBULATORY_CARE_PROVIDER_SITE_OTHER): Payer: 59 | Admitting: Family Medicine

## 2014-12-08 ENCOUNTER — Telehealth: Payer: Self-pay | Admitting: *Deleted

## 2014-12-08 VITALS — BP 181/100 | HR 59 | Temp 98.5°F | Resp 16 | Ht 65.0 in | Wt 266.8 lb

## 2014-12-08 DIAGNOSIS — N183 Chronic kidney disease, stage 3 unspecified: Secondary | ICD-10-CM

## 2014-12-08 DIAGNOSIS — I10 Essential (primary) hypertension: Secondary | ICD-10-CM

## 2014-12-08 DIAGNOSIS — E1129 Type 2 diabetes mellitus with other diabetic kidney complication: Secondary | ICD-10-CM | POA: Diagnosis not present

## 2014-12-08 DIAGNOSIS — Z9114 Patient's other noncompliance with medication regimen: Secondary | ICD-10-CM

## 2014-12-08 DIAGNOSIS — E1165 Type 2 diabetes mellitus with hyperglycemia: Secondary | ICD-10-CM

## 2014-12-08 DIAGNOSIS — IMO0002 Reserved for concepts with insufficient information to code with codable children: Secondary | ICD-10-CM

## 2014-12-08 LAB — GLUCOSE, POCT (MANUAL RESULT ENTRY): POC Glucose: 151 mg/dl — AB (ref 70–99)

## 2014-12-08 LAB — POCT GLYCOSYLATED HEMOGLOBIN (HGB A1C): Hemoglobin A1C: 11.3

## 2014-12-08 MED ORDER — DOXAZOSIN MESYLATE 4 MG PO TABS: 4.0000 mg | ORAL_TABLET | Freq: Two times a day (BID) | ORAL | Status: DC

## 2014-12-08 MED ORDER — BENAZEPRIL HCL 20 MG PO TABS
20.0000 mg | ORAL_TABLET | Freq: Every day | ORAL | Status: DC
Start: 2014-12-08 — End: 2015-01-26

## 2014-12-08 MED ORDER — LABETALOL HCL 100 MG PO TABS
400.0000 mg | ORAL_TABLET | Freq: Two times a day (BID) | ORAL | Status: DC
Start: 2014-12-08 — End: 2016-04-18

## 2014-12-08 NOTE — Progress Notes (Signed)
12/08/2014 at 5:26 PM  Rachel Kim / DOB: [birth year] / MRN: [id number]  The patient has Hypothyroidism; Mixed hyperlipidemia; Morbid obesity; OSA on CPAP; DM type 2, uncontrolled, with renal complications; Headache; Accelerated hypertension; CKD (chronic kidney disease) stage 3, GFR 30-59 ml/min; and Acute on chronic respiratory failure with hypoxia on her problem list.  SUBJECTIVE  Chief complaint: Follow-up  Patient here for hospital follow up discharged on 11/28/2014 and seen in the ED the next day for elevated blood sugar.  She has socioeconomic difficulty and was unable to afford her medications at the time of admission, and per her last visit with [facility] she was still unable to afford this medications.   For her hypertension she has been prescribed Bystolic 10 mg. She is also taking ASA 81 mg given her high risk of cadiovascular disease. Her history of renal failure contraindicates her ability to take Lipsinopril or a diuretic. She was prescribed Imdur at the ED for HTN and reports she took this for 1.75 weeks and reports constant headaches.    She has a history of diabetes since 2002 and has been controlling her sugar much better as of late, and it has been running around 100-140 since her last hospital admission.  She checks her BG daily and would like a prescription for lancet.    She sees Rachel Kim for her kidney failure and has prescribed her indapamide in the past.  He works at [employer]  She ran out of the indapamide and has not been back to see him for refills.  She last saw him   She  has a past medical history of Anxiety; Depression; Diabetes mellitus; GERD (gastroesophageal reflux disease); Hyperlipidemia; Hypertension; Thyroid disease; Sleep apnea; Sarcoidosis of lung; Chronic kidney disease (CKD), stage III (moderate); RBBB; LVH (left ventricular hypertrophy); High cholesterol; Hyperthyroidism; H/O breast biopsy (02/10/10); OSA (obstructive sleep apnea); CSA (central sleep  apnea); and Pneumonia (04/04).    Medications reviewed and updated by myself where necessary, and exist elsewhere in the encounter.   Rachel Kim is allergic to doxazosin; labetalol; actos; amlodipine; cozaar; glipizide; imdur; and clindamycin hcl. She  reports that she has never smoked. She has never used smokeless tobacco. She reports that she does not drink alcohol or use illicit drugs. She  reports that she does not engage in sexual activity. The patient  has past surgical history that includes Tonsillectomy; Appendectomy; Retroperitoneal lymph node excision; Elbow fracture surgery; Rhinoplasty; tubinates trimmed; laminectomy l5, s1; Carpal tunnel release; rt knee arthroscopy; Cholecystectomy; laminectomy c5-c6; and Abdominal hysterectomy (12/07).  Her family history includes Colitis in her father; Colon cancer in her maternal grandmother; Hypertension in her mother; Stroke in her mother; Thyroid disease in her brother, brother, father, mother, sister, and sister.  Review of Systems  Constitutional: Negative for fever.  Eyes: Negative for blurred vision.  Respiratory: Negative for cough.   Cardiovascular: Negative for chest pain and palpitations.  Gastrointestinal: Negative for heartburn and nausea.  Genitourinary: Negative.   Musculoskeletal: Negative for myalgias.  Skin: Negative for itching and rash.  Neurological: Negative for dizziness and headaches.  Endo/Heme/Allergies: Negative for polydipsia.    OBJECTIVE  Her  height is 5\' 5"  (1.651 m) and weight is 266 lb 12.8 oz (121.02 kg). Her oral temperature is 98.5 F (36.9 C). Her blood pressure is 181/100 and her pulse is 59. Her respiration is 16 and oxygen saturation is 96%.  The patient's body mass index is 44.4 kg/(m^2).  Physical Exam  Constitutional:  She is oriented to person, place, and time. She appears well-developed.  Neck: Normal range of motion. No thyromegaly present.  Cardiovascular: Normal rate and regular rhythm.     Respiratory: Effort normal and breath sounds normal. No stridor.  GI: Soft. Bowel sounds are normal.  Musculoskeletal: Normal range of motion.  Neurological: She is alert and oriented to person, place, and time. No cranial nerve deficit. Coordination normal.  Skin: Skin is warm, dry and intact. She is not diaphoretic. No pallor.  Psychiatric: She has a normal mood and affect.    Results for orders placed or performed in visit on 12/08/14 (from the past 24 hour(s))  POCT glycosylated hemoglobin (Hb A1C)     Status: None   Collection Time: 12/08/14  3:17 PM  Result Value Ref Range   Hemoglobin A1C 11.3   POCT glucose (manual entry)     Status: Abnormal   Collection Time: 12/08/14  3:17 PM  Result Value Ref Range   POC Glucose 151 (A) 70 - 99 mg/dl    ASSESSMENT & PLAN  Rachel Kim was seen today for follow-up.  Diagnoses and all orders for this visit:  Accelerated hypertension: She is to finish her Bystolic and then start Labetelol 400 bid.  She is to start benazapril today at 20 mg. She refused to start Doxazosin because of swelling. Will hold on Lozol today given I have never prescribed that drug and know little about its effects.  Orders: -     benazepril (LOTENSIN) 20 MG tablet; Take 1 tablet (20 mg total) by mouth daily. -     labetalol (NORMODYNE) 100 MG tablet; Take 4 tablets (400 mg total) by mouth 2 (two) times daily. -     Discontinue: doxazosin (CARDURA) 4 MG tablet; Take 1 tablet (4 mg total) by mouth 2 (two) times daily. -     Hydralizine: Advised patient to take this medication TID PRN only if her BP is greater than 180/100.    DM type 2, uncontrolled, with renal complications: K1S elevated today however CBG at target.  Will reevaluate in two weeks for medication changes if needed. Blood glucose testing supplies called into the pharmacy per patient specifications.  Orders: -     POCT glycosylated hemoglobin (Hb A1C) -     Cancel: POCT CBG (Fasting - Glucose) -     POCT  glucose (manual entry)  CKD (chronic kidney disease) stage 3, GFR 30-59 ml/min: I spoke with Dr. Jason Nest nurse at [facility]  She reports that Rachel Kim had stopped her Bystolic and had put her on labetelol 500 bid, benazapril 40 mg, doxazosin 8 mg, and Lozol.  She reported to me that she was given a prescription for Lozol, but failed to tell me about the remainder of the medications until I had mentioned them.The nurse I spoke with will try to move her follow up up to ASAP.   Orders: -     Ambulatory referral to Nephrology -     benazepril (LOTENSIN) 20 MG tablet; Take 1 tablet (20 mg total) by mouth daily.  Non compliance w medication regimen: Patient one a completely different regimen now than prescribed by her nephrologist last December.  She will notice a symptom, associate it with a medication, and then fail to call the provider who prescribed the medication.  I advised that she call in the future, rather than stopping medications and relying on non specialist to create new plans to control complex and previously worked up problems.  The patient was advised to call or come back to clinic if she does not see an improvement in symptoms, or worsens with the above plan.   Philis Fendt, MHS, PA-C [facility] 12/08/2014 5:26 PM

## 2014-12-08 NOTE — Telephone Encounter (Signed)
Called pharmacy spoke to [facility] Warehouse manager) to order correct test strips/lancets for [employer] with 3 refills, per Philis Fendt PA-C.

## 2014-12-09 ENCOUNTER — Telehealth: Payer: Self-pay | Admitting: Physician Assistant

## 2014-12-09 MED ORDER — INSULIN GLARGINE 300 UNIT/ML ~~LOC~~ SOPN: 22.0000 [IU] | PEN_INJECTOR | Freq: Every day | SUBCUTANEOUS | Status: DC

## 2014-12-09 NOTE — Telephone Encounter (Signed)
I got info regarding [employer] to replace her Lantus.  I have a free sample coupon for her.  I have not gotten in touch with the rep for the Victoza and Novolog.  i would like to know how she is doing with her patient assistant programs to help with her medications.

## 2014-12-09 NOTE — Telephone Encounter (Signed)
Wrote patient from [employer].  She is to continue to work with patient assistance.  She found out that she does not have insurance like she thought she did.  She plans to go home tonight to work on the patient assistance programs.  She states that her sugars are better and she is trying to eat better.

## 2014-12-10 ENCOUNTER — Telehealth: Payer: Self-pay

## 2014-12-10 NOTE — Telephone Encounter (Signed)
Patient wants to leave a message for Philis Fendt. Patient states that [employer] on [address] need the prescription for Lozol and lexapro. Patient EHMCN:[id number]

## 2014-12-12 ENCOUNTER — Other Ambulatory Visit: Payer: Self-pay | Admitting: Physician Assistant

## 2014-12-12 DIAGNOSIS — F32A Depression, unspecified: Secondary | ICD-10-CM

## 2014-12-12 DIAGNOSIS — F329 Major depressive disorder, single episode, unspecified: Secondary | ICD-10-CM

## 2014-12-12 MED ORDER — ESCITALOPRAM OXALATE 20 MG PO TABS: 30.0000 mg | ORAL_TABLET | Freq: Every day | ORAL | Status: DC

## 2014-12-12 NOTE — Telephone Encounter (Signed)
Lexpro filled.  Please call patient.  We are not prescribing Lozol as patient has kidney doctor for this medication and we are unfamiliar with its effects. Philis Fendt, MS, PA-C   4:51 PM, 12/12/2014

## 2014-12-12 NOTE — Telephone Encounter (Signed)
We did not prescribe Lozol.

## 2014-12-15 NOTE — Telephone Encounter (Signed)
Spoke with pt, advised message from Michael. Pt understood. 

## 2014-12-20 ENCOUNTER — Telehealth: Payer: Self-pay | Admitting: Physician Assistant

## 2014-12-20 NOTE — Telephone Encounter (Signed)
Spoke with patient. She states she will come in tomorrow

## 2014-12-20 NOTE — Telephone Encounter (Signed)
Please call the patient.  I have found her a medication that is similar to her Rachel Kim that is called Trulicity.  It is a once a week injection and I have 12 weeks of samples for her.  I want her to come and see me this weekend so I can give her the medication and we can figure out where she is with calling the drug companies for her medications.

## 2014-12-21 ENCOUNTER — Telehealth: Payer: Self-pay | Admitting: Physician Assistant

## 2014-12-21 DIAGNOSIS — F329 Major depressive disorder, single episode, unspecified: Secondary | ICD-10-CM

## 2014-12-21 DIAGNOSIS — F32A Depression, unspecified: Secondary | ICD-10-CM

## 2014-12-21 MED ORDER — ESCITALOPRAM OXALATE 20 MG PO TABS
30.0000 mg | ORAL_TABLET | Freq: Every day | ORAL | Status: DC
Start: 2014-12-21 — End: 2015-01-26

## 2014-12-21 MED ORDER — ESCITALOPRAM OXALATE 20 MG PO TABS: 30.0000 mg | ORAL_TABLET | Freq: Every day | ORAL | Status: DC

## 2014-12-22 ENCOUNTER — Ambulatory Visit: Payer: 59 | Admitting: Physician Assistant

## 2014-12-26 NOTE — Telephone Encounter (Signed)
Pt came into clinic for instruction on Trulicity.  Sample were given.  How to use the medication what to expect was discussed with the patient.  She has f/u planned.

## 2015-01-17 ENCOUNTER — Telehealth: Payer: Self-pay

## 2015-01-17 NOTE — Telephone Encounter (Signed)
Pt came into [facility] ans states that [facility] is requesting a refill on levothyroxine (SYNTHROID, LEVOTHROID) 100 MCG tablet [[id number]]

## 2015-01-17 NOTE — Telephone Encounter (Signed)
Do we Rx this for pt?

## 2015-01-19 ENCOUNTER — Ambulatory Visit (INDEPENDENT_AMBULATORY_CARE_PROVIDER_SITE_OTHER): Payer: 59 | Admitting: Cardiovascular Disease

## 2015-01-19 ENCOUNTER — Encounter: Payer: Self-pay | Admitting: Cardiovascular Disease

## 2015-01-19 VITALS — BP 144/90 | HR 64 | Ht 65.0 in | Wt 265.4 lb

## 2015-01-19 DIAGNOSIS — E782 Mixed hyperlipidemia: Secondary | ICD-10-CM

## 2015-01-19 DIAGNOSIS — IMO0002 Reserved for concepts with insufficient information to code with codable children: Secondary | ICD-10-CM

## 2015-01-19 DIAGNOSIS — G4733 Obstructive sleep apnea (adult) (pediatric): Secondary | ICD-10-CM

## 2015-01-19 DIAGNOSIS — E1129 Type 2 diabetes mellitus with other diabetic kidney complication: Secondary | ICD-10-CM

## 2015-01-19 DIAGNOSIS — I10 Essential (primary) hypertension: Secondary | ICD-10-CM

## 2015-01-19 DIAGNOSIS — E1165 Type 2 diabetes mellitus with hyperglycemia: Secondary | ICD-10-CM

## 2015-01-19 DIAGNOSIS — Z9989 Dependence on other enabling machines and devices: Secondary | ICD-10-CM

## 2015-01-19 MED ORDER — HYDRALAZINE HCL 25 MG PO TABS
25.0000 mg | ORAL_TABLET | Freq: Three times a day (TID) | ORAL | Status: DC
Start: 2015-01-19 — End: 2015-02-10

## 2015-01-19 NOTE — Patient Instructions (Signed)
Medication Instructions:   Take Hydralazine every 8 hours (three times a day) every day.  Labwork:  none  Testing/Procedures:  none  Follow-Up:  4 months  Any Other Special Instructions Will Be Listed Below (If Applicable).  Send your BP readings to Dr. Loletha Grayer through [facility] at the end of the week.

## 2015-01-19 NOTE — Progress Notes (Signed)
Patient ID: Rachel Kim, female   DOB: 12-Sep-1955, 59 y.o.   MRN: [id number]     Cardiology Office Note   Date:  01/19/2015   ID:  ZOELLE Kim, DOB 07-Nov-1955, MRN [id number]  PCP:  Jenny Reichmann, MD  Cardiologist:   Sanda Klein, MD   Chief Complaint  Patient presents with  . ROV    patient reports fatigue, shortness of breath on exertion, swelling.      History of Present Illness: Rachel Kim is a 59 y.o. female who presents for all up of systemic hypertension in the setting of obesity, diabetes mellitus and chronic kidney disease. She ran out of insurance, ran out of all of her medicines and felt great for a couple of months. Subsequently she became very ill and was admitted to the hospital for accelerated hypertension in late April.   Her medications have been changed multiple times even since that recent hospitalization. As always there is a lot of confusion regarding the medicines that she is actually taking. her medication list shows labetalol 400 mg twice daily, but she is actually taking 100 mg twice daily. The same list describes hydralazine 25 mg every 8 hours as a scheduled medicine, but she states that she was told to take this "as needed only" when her systolic blood pressures greater than 180. She does not know who gave her those instructions Blood pressure recordings at home have often shown diastolic BP greater than 347 (although her cuff size is probably too small for arm size). She was poorly tolerant of carvedilol and metoprolol due to fatigue. She believes the labetalol is causing similar problems. She does not recall whether she was intolerant of the  Bystolic, which she only took briefly due to its cost. Has not tolerated calcium channel blockers due to excessive swelling. Had severe headaches with nitrates.  She lost about 30 pounds compared to last year but has gained 10 of those pounds back. She asks whether she should have bariatric surgery. Dr. Justin Mend is her  nephrologist.  Echo 11/27/2014 - Left ventricle: The cavity size was normal. There was moderate concentric hypertrophy. Systolic function was normal. The estimated ejection fraction was in the range of 60% to 65%. Wall motion was normal; there were no regional wall motion abnormalities. There was an increased relative contribution of atrial contraction to ventricular filling. Doppler parameters are consistent with abnormal left ventricular relaxation (grade 1diastolic dysfunction). - Aortic valve: Trileaflet; normal thickness, mildly calcifiedleaflets. - Mitral valve: There was mild regurgitation.  Renal Duplex US There is no obvious evidence of hemodynamically significant renal artery stenosis >60% bilaterally.  Past Medical History  Diagnosis Date  . Anxiety   . Depression   . Diabetes mellitus   . GERD (gastroesophageal reflux disease)   . Hyperlipidemia   . Hypertension   . Thyroid disease     hypothyroidism  . Sleep apnea     uses CPAP  . Sarcoidosis of lung   . Chronic kidney disease (CKD), stage III (moderate)   . RBBB   . LVH (left ventricular hypertrophy)     echo 02/25/10 EF >55%  . High cholesterol   . Hyperthyroidism     diagnosed 1989  . H/O breast biopsy 02/10/10    calcification , no tumors  . OSA (obstructive sleep apnea)     severe complex apnea  . CSA (central sleep apnea)   . Pneumonia 04/04    Past Surgical History  Procedure Laterality Date  .  Tonsillectomy    . Appendectomy    . Retroperitoneal lymph node excision      right underarm  . Elbow fracture surgery      with pins and pins removed  . Rhinoplasty      has had several revisions  . Tubinates trimmed    . Laminectomy l5, s1    . Carpal tunnel release      right and left  . Rt knee arthroscopy    . Cholecystectomy    . Laminectomy c5-c6    . Abdominal hysterectomy  12/07     Current Outpatient Prescriptions  Medication Sig Dispense Refill  . acetaminophen (TYLENOL) 500  MG tablet Take 500 mg by mouth every 6 (six) hours as needed.    Marland Kitchen aspirin 81 MG tablet Take 1 tablet (81 mg total) by mouth daily. 30 tablet 0  . benazepril (LOTENSIN) 20 MG tablet Take 1 tablet (20 mg total) by mouth daily. 30 tablet 1  . Cholecalciferol (VITAMIN D) 2000 UNITS CAPS Take by mouth daily.      . clonazePAM (KLONOPIN) 0.5 MG tablet Take 0.5 tablets (0.25 mg total) by mouth at bedtime as needed for anxiety (sleep). Take 1/4 tablet as needed 10 tablet 0  . Dulaglutide 0.75 MG/0.5ML SOPN Inject 0.75 mg into the skin once a week.    . escitalopram (LEXAPRO) 20 MG tablet Take 1.5 tablets (30 mg total) by mouth daily. Take 1 1/2 daily 45 tablet 2  . hydrALAZINE (APRESOLINE) 25 MG tablet Take 1 tablet (25 mg total) by mouth every 8 (eight) hours. 90 tablet 0  . insulin aspart (NOVOLOG) 100 UNIT/ML injection  CBG 151 - 200: 2 units    CBG 201 - 250: 3 units    CBG 251 - 300: 5 units    CBG 301 - 350: 7 units    CBG 351 - 400 9 units 10 mL 1  . Insulin Glargine (TOUJEO SOLOSTAR) 300 UNIT/ML SOPN Inject 22-100 Units into the skin daily. 7 pen 0  . Insulin Pen Needle (B-D ULTRAFINE III SHORT PEN) 31G X 8 MM MISC Inject 1 each into the skin 2 (two) times daily. 60 each 5  . labetalol (NORMODYNE) 100 MG tablet Take 4 tablets (400 mg total) by mouth 2 (two) times daily. 60 tablet 1  . levothyroxine (SYNTHROID, LEVOTHROID) 100 MCG tablet Take 1 tablet (100 mcg total) by mouth daily before breakfast. 30 tablet 0  . Magnesium 250 MG TABS Take 1 tablet by mouth 2 (two) times daily.     . Multiple Vitamins-Minerals (PRESERVISION AREDS 2 PO) Take 1 tablet by mouth 2 (two) times daily.    Marland Kitchen oxybutynin (DITROPAN) 5 MG tablet Take 1 tablet (5 mg total) by mouth 2 (two) times daily. 60 tablet 0  . pravastatin (PRAVACHOL) 40 MG tablet Take 1 tablet (40 mg total) by mouth every evening. 90 tablet 0  . indapamide (LOZOL) 1.25 MG tablet Take 1 tablet by mouth daily.  11   No current  facility-administered medications for this visit.    Allergies:   Doxazosin; Labetalol; Actos; Amlodipine; Cozaar; Glipizide; Imdur; and Clindamycin hcl    Social History:  The patient  reports that she has never smoked. She has never used smokeless tobacco. She reports that she does not drink alcohol or use illicit drugs.   Family History:  The patient's family history includes Colitis in her father; Colon cancer in her maternal grandmother; Hypertension in her mother; Stroke in  her mother; Thyroid disease in her brother, brother, father, mother, sister, and sister.    ROS:  Please see the history of present illness.    Otherwise, review of systems positive for fatigue, swelling, class II exertional dyspnea.   All other systems are reviewed and negative.    PHYSICAL EXAM: VS:  BP 144/90 mmHg  Pulse 64  Ht 5\' 5"  (1.651 m)  Wt 265 lb 6.4 oz (120.385 kg)  BMI 44.16 kg/m2  LMP 07/01/2006 , BMI Body mass index is 44.16 kg/(m^2).  General: Alert, oriented x3, no distress Head: no evidence of trauma, PERRL, EOMI, no exophtalmos or lid lag, no myxedema, no xanthelasma; normal ears, nose and oropharynx Neck: normal jugular venous pulsations and no hepatojugular reflux; brisk carotid pulses without delay and no carotid bruits Chest: clear to auscultation, no signs of consolidation by percussion or palpation, normal fremitus, symmetrical and full respiratory excursions Cardiovascular: normal position and quality of the apical impulse, regular rhythm, normal first and widely split second heart sounds, no murmurs, rubs or gallops Abdomen: no tenderness or distention, no masses by palpation, no abnormal pulsatility or arterial bruits, normal bowel sounds, no hepatosplenomegaly Extremities: no clubbing, cyanosis or edema; 2+ radial, ulnar and brachial pulses bilaterally; 2+ right femoral, posterior tibial and dorsalis pedis pulses; 2+ left femoral, posterior tibial and dorsalis pedis pulses; no  subclavian or femoral bruits Neurological: grossly nonfocal Psych: euthymic mood, full affect   EKG:  EKG is ordered today. The ekg ordered today demonstrates sinus rhythm, right bundle branch block, left anterior fascicular block, old conduction abnormality   Recent Labs: 11/25/2014: ALT 26 11/26/2014: Hemoglobin 13.3; Platelets 167; TSH 0.196* 11/29/2014: BUN 35*; Creatinine, Ser 1.63*; Potassium 4.1; Sodium 138    Lipid Panel    Component Value Date/Time   CHOL 129 06/11/2013 1009   TRIG 227* 06/11/2013 1009   HDL 34* 06/11/2013 1009   CHOLHDL 3.8 06/11/2013 1009   VLDL 45* 06/11/2013 1009   LDLCALC 50 06/11/2013 1009      Wt Readings from Last 3 Encounters:  01/19/15 265 lb 6.4 oz (120.385 kg)  12/08/14 266 lb 12.8 oz (121.02 kg)  12/01/14 268 lb 3.2 oz (121.655 kg)      ASSESSMENT AND PLAN:  Trying to help Mrs. Delconte control her blood pressure has always been a very frustrating experience. She has had side effects with multiple medications. She seems to sometimes misunderstand instructions. She has multiple providers. She is at high risk for serious complications from poorly controlled blood pressure, but the immediate concern is progression of chronic kidney disease.  I think the hydralazine should be a scheduled medication. I have asked her to start taking 25 mg 3 times daily and to send me recordings of her blood pressure through my [email or web address]. I also think that Dr. Justin Mend is best suited to fine tune her anti-hypertensive regimen and will defer decisions regarding ACE inhibitor dose and diuretics to him.  As far as the question of bariatric surgery, I do not think there are any cardiac contraindications to this procedure and I think it may have salutary effects and protecting Mrs. Mukherjee from the long-term complications of hypertension and diabetes. I have encouraged her to continue going to the weight loss seminars that she has recently started attending.   Current  medicines are reviewed at length with the patient today.  The patient has concerns regarding medicines.  The following changes have been made:  Take hydralazine 25 mg every 8 hours as a  scheduled medication not only "as needed"  Labs/ tests ordered today include:  No orders of the defined types were placed in this encounter.    Patient Instructions  Medication Instructions:   Take Hydralazine every 8 hours (three times a day) every day.  Labwork:  none  Testing/Procedures:  none  Follow-Up:  4 months  Any Other Special Instructions Will Be Listed Below (If Applicable).  Send your BP readings to Dr. Loletha Grayer through [facility] at the end of the week.      Mikael Spray, MD  01/19/2015 8:29 AM    Sanda Klein, MD, [facility] [phone] office [phone] pager

## 2015-01-20 ENCOUNTER — Other Ambulatory Visit: Payer: Self-pay | Admitting: Radiology

## 2015-01-20 NOTE — Telephone Encounter (Signed)
Patient switching from [facility] to [facility] and needs a refill on Levothyroxine.  Placed in "meds and orders" for approval.

## 2015-01-21 ENCOUNTER — Other Ambulatory Visit: Payer: Self-pay | Admitting: Physician Assistant

## 2015-01-21 ENCOUNTER — Telehealth: Payer: Self-pay

## 2015-01-21 ENCOUNTER — Telehealth: Payer: Self-pay | Admitting: Emergency Medicine

## 2015-01-21 ENCOUNTER — Ambulatory Visit: Payer: 59

## 2015-01-21 MED ORDER — LEVOTHYROXINE SODIUM 100 MCG PO TABS: 100.0000 ug | ORAL_TABLET | Freq: Every day | ORAL | Status: DC

## 2015-01-21 NOTE — Telephone Encounter (Signed)
FOR SARAH WEBER:  Pt wanted to thank you for filing her pt's assistance and would like to know what the status is, also she is applying to [place] pt's assistance program so she can continue seeing you You may call pt at [phone] if needed

## 2015-01-21 NOTE — Telephone Encounter (Signed)
Pt wanted to thank Dr.Daub for calling in her medicine, she really appreciate it.

## 2015-01-21 NOTE — Telephone Encounter (Signed)
I refilled Rachel Kim's Synthroid. Apparently she is changing pharmacies and needed a refill.

## 2015-01-26 ENCOUNTER — Other Ambulatory Visit: Payer: Self-pay | Admitting: Physician Assistant

## 2015-01-26 DIAGNOSIS — N183 Chronic kidney disease, stage 3 unspecified: Secondary | ICD-10-CM

## 2015-01-26 DIAGNOSIS — F32A Depression, unspecified: Secondary | ICD-10-CM

## 2015-01-26 DIAGNOSIS — F329 Major depressive disorder, single episode, unspecified: Secondary | ICD-10-CM

## 2015-01-26 DIAGNOSIS — I10 Essential (primary) hypertension: Secondary | ICD-10-CM

## 2015-01-26 MED ORDER — INSULIN GLARGINE 300 UNIT/ML ~~LOC~~ SOPN: 22.0000 [IU] | PEN_INJECTOR | Freq: Every day | SUBCUTANEOUS | Status: DC

## 2015-01-26 MED ORDER — INSULIN PEN NEEDLE 32G X 4 MM MISC: 1.0000 "application " | Freq: Every day | Status: DC

## 2015-01-26 MED ORDER — INSULIN ASPART 100 UNIT/ML FLEXPEN
PEN_INJECTOR | SUBCUTANEOUS | Status: DC
Start: 2015-01-26 — End: 2016-11-25

## 2015-01-26 MED ORDER — ESCITALOPRAM OXALATE 20 MG PO TABS: 30.0000 mg | ORAL_TABLET | Freq: Every day | ORAL | Status: DC

## 2015-01-26 MED ORDER — MIRABEGRON ER 50 MG PO TB24: 50.0000 mg | ORAL_TABLET | Freq: Every day | ORAL | Status: DC

## 2015-01-26 MED ORDER — BENAZEPRIL HCL 20 MG PO TABS: 20.0000 mg | ORAL_TABLET | Freq: Every day | ORAL | Status: DC

## 2015-01-26 MED ORDER — DULAGLUTIDE 1.5 MG/0.5ML ~~LOC~~ SOAJ: 1.5000 mg | Freq: Every day | SUBCUTANEOUS | Status: DC

## 2015-01-26 NOTE — Progress Notes (Signed)
I have printed Rx for all her medications.  We are working on making sure everything is sorted and faxed at this time to the correct companies.

## 2015-01-26 NOTE — Telephone Encounter (Signed)
I have printed all her Rx.  We are working to make sure the paperwork is correct from our end and then we will be sending to the companies.  As these medications come into the clinic we will contact her to come and pick-up.  I have requested a 3 months supply of all these medications and I have put a year refills on them all.  For the medications for [facility] I am not sure the best way to get them there.  Please let me know if I need to do anything else.

## 2015-01-28 DIAGNOSIS — R351 Nocturia: Secondary | ICD-10-CM | POA: Insufficient documentation

## 2015-01-28 DIAGNOSIS — N3281 Overactive bladder: Secondary | ICD-10-CM | POA: Insufficient documentation

## 2015-01-28 NOTE — Telephone Encounter (Signed)
I completed and faxed all PPW (with confirmation of receipt) except [place]. I called and req'd application form and have completed all that I can. Called pt and asked her to provide me total yearly medical expenses as this is needed for the application. Pt stated she will figure that amount and call me back.

## 2015-02-02 NOTE — Telephone Encounter (Signed)
Received approval from [employer] Patient Asst for 1 year. A 4 mos of medication is being shipped to MD office and should arrive in 7-10 business days.  Also received approval from [employer] for [facility] through 01/29/16. A shipment will be shipped to MD office w/in 3-5 bus days.

## 2015-02-06 ENCOUNTER — Telehealth: Payer: Self-pay | Admitting: Physician Assistant

## 2015-02-06 NOTE — Telephone Encounter (Signed)
I called and spoke with the patient that her Toujeois in from the patient assistance program - the packages are at 104 in the fridge with her name on them.  She plans to come into office tomorrow and pick up her medications.

## 2015-02-06 NOTE — Telephone Encounter (Signed)
Received approval from [employer] for [place] for 1 year, case # [id number]. Medication will be shipped to MD office w/in 4 wks.

## 2015-02-07 ENCOUNTER — Encounter: Payer: Self-pay | Admitting: Cardiovascular Disease

## 2015-02-10 MED ORDER — HYDRALAZINE HCL 50 MG PO TABS: 50.0000 mg | ORAL_TABLET | Freq: Three times a day (TID) | ORAL | Status: DC

## 2015-02-10 NOTE — Telephone Encounter (Signed)
-----   Message from Sanda Klein, MD sent at 02/10/2015  8:18 AM EDT ----- Pamala Hurry, Please increase the hydralazine to 50 mg three times a day (I sent her msg through mychart after she sent some BP readings) MCr

## 2015-02-10 NOTE — Telephone Encounter (Signed)
LM with instructions to increase Hydralazine to 50mg  3 times a day and that a new Rx has been sent to her pharmacy.  Instructed her to read her message in MyChart if she has not already done this and to call if she has any questions.

## 2015-02-17 ENCOUNTER — Other Ambulatory Visit: Payer: Self-pay

## 2015-02-17 ENCOUNTER — Encounter: Payer: Self-pay | Admitting: Physician Assistant

## 2015-02-17 DIAGNOSIS — I10 Essential (primary) hypertension: Secondary | ICD-10-CM

## 2015-02-17 DIAGNOSIS — N183 Chronic kidney disease, stage 3 unspecified: Secondary | ICD-10-CM

## 2015-02-17 MED ORDER — BENAZEPRIL HCL 20 MG PO TABS: 20.0000 mg | ORAL_TABLET | Freq: Every day | ORAL | Status: DC

## 2015-02-19 ENCOUNTER — Other Ambulatory Visit: Payer: Self-pay | Admitting: Physician Assistant

## 2015-02-19 NOTE — Telephone Encounter (Signed)
Called pt to check if she has heard back from [facility] yet. She reported that the last she heard was that they needed a providers signature and I advised her that I had re-faxed Rx with Dr Ninfa Meeker signature on it as supervising MD on 02/03/15 and have not heard back from them. Pt will call if she is notified of decision.

## 2015-03-05 ENCOUNTER — Encounter: Payer: Self-pay | Admitting: *Deleted

## 2015-03-11 ENCOUNTER — Encounter: Payer: Self-pay | Admitting: Emergency Medicine

## 2015-03-13 ENCOUNTER — Other Ambulatory Visit: Payer: Self-pay

## 2015-03-13 MED ORDER — INSULIN PEN NEEDLE 32G X 4 MM MISC: 1.0000 "application " | Freq: Every day | Status: DC

## 2015-03-13 NOTE — Telephone Encounter (Signed)
Can you please send both of these in. One of the other providers will have to sign the clonazepam prescription.    ----- Message -----     From: Jannette Spanner, CMA     Sent: 03/12/2015  3:40 PM      To: Darlyne Russian, MD    Subject: FW: Non-Urgent Medical Question                       ----- Message -----     From: Marybelle Killings     Sent: 03/11/2015  9:42 PM      To: [facility] Clinical Message Pool    Subject: Non-Urgent Medical Question                 Please send in an Rx for Clonazepam,0.5 mg tablets to [facility] on [address] [phone]). I only need 20 tablets. They will last me a long time, as I only use 1/4 to 1/2 a tablet at bedtime.        Please send in an Rx for BD short UnltraFine 8 mm nano pen needles, box of 100 to [facility] [phone]). I use one pen needle per day for Toujeo.        Thank you so much!        Palma Holter        Can some one sign rx for Dr. Everlene Farrier?

## 2015-03-14 MED ORDER — CLONAZEPAM 0.5 MG PO TABS: 0.1250 mg | ORAL_TABLET | Freq: Every evening | ORAL | Status: DC | PRN

## 2015-03-14 NOTE — Telephone Encounter (Signed)
Done

## 2015-03-22 ENCOUNTER — Ambulatory Visit (INDEPENDENT_AMBULATORY_CARE_PROVIDER_SITE_OTHER): Payer: 59 | Admitting: Internal Medicine

## 2015-03-22 VITALS — BP 128/78 | HR 81 | Temp 98.6°F | Resp 18 | Ht 65.5 in | Wt 269.0 lb

## 2015-03-22 DIAGNOSIS — E039 Hypothyroidism, unspecified: Secondary | ICD-10-CM

## 2015-03-22 NOTE — Progress Notes (Signed)
Subjective:  This chart was scribed for Tami Lin, MD by [facility], medical scribe at [facility].The patient was seen in exam room 10 and the patient's care was started at 1:21 PM.   Patient ID: Rachel Kim, female    DOB: November 27, 1955, 59 y.o.   MRN: [id number] Chief Complaint  Patient presents with  . Fatigue    since 5/3   . Constipation  . labs-tsh  . Depression    see screen   HPI  HPI Comments: KAROL SKARZYNSKI is a 58 y.o. female who presents to [facility] for a labs recheck because she thinks her thyroid hormone is not being adequately replaced. She has a history of hypothyroidism and had been on 150 g of Synthroid for several years until her hospitalization in April for accelerated hypertension following her discontinuing medications. She was discharged on 100 g.(Chart review indicates her TSH was low at time of hospitalization) Since that time she has had marked fatigue and constipation with depressed mood that has increased steadily.   She has labs showing A1C now ok from nephrology  Patient Active Problem List   Diagnosis Date Noted  . CKD (chronic kidney disease) stage 3, GFR 30-59 ml/min 11/27/2014    Priority: High  . OSA on CPAP 12/19/2012    Priority: Medium  . Hypothyroidism 09/28/2006    Priority: Medium  . Controlled diabetes mellitus type 2 with complications 78/67/6720    Priority: Medium  . Mixed hyperlipidemia 09/28/2006    Priority: Medium  . Morbid obesity 09/28/2006    Priority: Medium  . Overactive bladder 01/28/2015  . Nocturia 01/28/2015  . Headache 11/27/2014  . Accelerated hypertension 11/27/2014  . Acute on chronic respiratory failure with hypoxia 11/27/2014  . DM type 2, uncontrolled, with renal complications 94/70/9628    Prior to Admission medications   Medication Sig Start Date End Date Taking? Authorizing Provider  acetaminophen (TYLENOL) 500 MG tablet Take 500 mg by mouth every 6  (six) hours as needed.   Yes Historical Provider, MD  aspirin 81 MG tablet Take 1 tablet (81 mg total) by mouth daily. 11/28/14  Yes Robbie Lis, MD  benazepril (LOTENSIN) 20 MG tablet TAKE ONE TABLET BY MOUTH EVERY DAY 02/20/15  Yes Tereasa Coop, PA-C  Cholecalciferol (VITAMIN D) 2000 UNITS CAPS Take by mouth daily.     Yes Historical Provider, MD  clonazePAM (KLONOPIN) 0.5 MG tablet Take 0.5 tablets (0.25 mg total) by mouth at bedtime as needed for anxiety (sleep). Take 1/4 tablet as needed 03/14/15  Yes Sarah Alleen Borne, PA-C  docusate sodium (COLACE) 100 MG capsule Take 100 mg by mouth 2 (two) times daily.   Yes Historical Provider, MD  Dulaglutide (TRULICITY) 1.5 ZM/6.2HU SOPN Inject 1.5 mg into the skin daily. 01/26/15  Yes Mancel Bale, PA-C  escitalopram (LEXAPRO) 20 MG tablet Take 1.5 tablets (30 mg total) by mouth daily. Take 1 1/2 daily 01/26/15  Yes Sarah Alleen Borne, PA-C  hydrALAZINE (APRESOLINE) 50 MG tablet Take 1 tablet (50 mg total) by mouth every 8 (eight) hours. 02/10/15  Yes Mihai Croitoru, MD  indapamide (LOZOL) 1.25 MG tablet Take 1 tablet by mouth daily. 01/13/15  Yes Historical Provider, MD  Insulin Glargine (TOUJEO SOLOSTAR) 300 UNIT/ML SOPN Inject 22-100 Units into the skin daily. 01/26/15  Yes Mancel Bale, PA-C  Insulin Pen Needle (BD PEN NEEDLE NANO U/F) 32G X 4 MM MISC 1 application  by Does not apply route daily. 03/13/15  Yes Darlyne Russian, MD  labetalol (NORMODYNE) 100 MG tablet Take 4 tablets (400 mg total) by mouth 2 (two) times daily. Patient taking differently: Take 100 mg by mouth 2 (two) times daily.  12/08/14  Yes Tereasa Coop, PA-C  levothyroxine (SYNTHROID, LEVOTHROID) 100 MCG tablet Take 1 tablet (100 mcg total) by mouth daily before breakfast. 01/21/15  Yes Tishira R Brewington, PA-C  Magnesium 250 MG TABS Take 1 tablet by mouth 2 (two) times daily.    Yes Historical Provider, MD  Multiple Vitamins-Minerals (PRESERVISION AREDS 2 PO) Take 1 tablet by mouth 2 (two)  times daily.   Yes Historical Provider, MD  pravastatin (PRAVACHOL) 40 MG tablet Take 1 tablet (40 mg total) by mouth every evening. 11/28/14  Yes Robbie Lis, MD  senna (SENOKOT) 8.6 MG tablet Take 1 tablet by mouth daily.   Yes Historical Provider, MD  insulin aspart (NOVOLOG) 100 UNIT/ML FlexPen Sliding scale   CBG 151 - 200: 2 units     CBG 201 - 250: 3 units     CBG 251 - 300: 5 units     CBG 301 - 350: 7 units     CBG 351 - 400 9 units Patient not taking: Reported on 03/22/2015 01/26/15   Mancel Bale, PA-C  mirabegron ER (MYRBETRIQ) 50 MG TB24 tablet Take 1 tablet (50 mg total) by mouth daily. Patient not taking: Reported on 03/22/2015 01/26/15   Mancel Bale, PA-C  oxybutynin (DITROPAN) 5 MG tablet Take 1 tablet (5 mg total) by mouth 2 (two) times daily. Patient not taking: Reported on 03/22/2015 12/01/14   Mancel Bale, PA-C   Allergies  Allergen Reactions  . Doxazosin Swelling  . Labetalol Swelling  . Actos [Pioglitazone Hydrochloride] Swelling  . Amlodipine Swelling  . Cozaar [Losartan Potassium] Other (See Comments)    Causes drowsiness/zombie like  . Glipizide Swelling  . Imdur [Isosorbide Dinitrate] Other (See Comments)    Causes severe headaches  . Clindamycin Hcl Rash   Review of Systems  Constitutional: Positive for fatigue.  Gastrointestinal: Positive for constipation.      Wt Readings from Last 3 Encounters:  03/22/15 269 lb (122.018 kg)  01/19/15 265 lb 6.4 oz (120.385 kg)  12/08/14 266 lb 12.8 oz (121.02 kg)    Objective:  BP 128/78 mmHg  Pulse 81  Temp(Src) 98.6 F (37 C) (Oral)  Resp 18  Ht 5' 5.5" (1.664 m)  Wt 269 lb (122.018 kg)  BMI 44.07 kg/m2  SpO2 96%  LMP 07/01/2006 Physical Exam  Constitutional: She is oriented to person, place, and time. She appears well-developed and well-nourished. No distress.  HENT:  Head: Normocephalic and atraumatic.  Eyes: Pupils are equal, round, and reactive to light.  Neck: Normal range of motion.    Cardiovascular: Normal rate and regular rhythm.   Pulmonary/Chest: Effort normal. No respiratory distress.  Musculoskeletal: Normal range of motion.  Neurological: She is alert and oriented to person, place, and time.  Skin: Skin is warm and dry.  Psychiatric: Her behavior is normal. Thought content normal.  Her mood is jovial today  Nursing note and vitals reviewed.     Assessment & Plan:  Hypothyroidism, unspecified hypothyroidism type - Plan: TSH + free T4 Fatigue Depressed Mood Constipation HTN-better control DM--better control CKD-3 OSA Economic problems   She has multiple medical problems and does not appear to have a coordinated follow-up plan so I'll arrange an  appointment at 104   I have completed the patient encounter in its entirety as documented by the scribe, with editing by me where necessary. Darriana Deboy P. Laney Pastor, M.D.

## 2015-03-25 LAB — TSH+FREE T4
FREE T4: 1.11 ng/dL (ref 0.82–1.77)
TSH: 4.6 u[IU]/mL — AB (ref 0.450–4.500)

## 2015-03-26 ENCOUNTER — Encounter: Payer: Self-pay | Admitting: Internal Medicine

## 2015-03-27 MED ORDER — LEVOTHYROXINE SODIUM 125 MCG PO TABS: 125.0000 ug | ORAL_TABLET | Freq: Every day | ORAL | Status: DC

## 2015-03-28 ENCOUNTER — Telehealth: Payer: Self-pay | Admitting: Family Medicine

## 2015-03-28 ENCOUNTER — Other Ambulatory Visit: Payer: Self-pay

## 2015-03-28 MED ORDER — LEVOTHYROXINE SODIUM 125 MCG PO TABS: 125.0000 ug | ORAL_TABLET | Freq: Every day | ORAL | Status: DC

## 2015-03-29 NOTE — Telephone Encounter (Signed)
Spoke with pt. Gave pt results RX sent to pharmacy

## 2015-04-14 NOTE — Telephone Encounter (Signed)
Called pt and she has never heard back from [facility]. I will try to contact someone from the company and see where this application stands.

## 2015-04-24 NOTE — Telephone Encounter (Signed)
Was able to get through to [facility] and was advised that they never received the signature on RX by the prescriber. I advised that both Judson Roch and a supervising provider had signed the Rx and it was explained that they do not accept the printed Rx from [facility], they only go by the application signature and Sherren Mocha had signed for Judson Roch. We just need to have Judson Roch sign it (and mark through Todd's name) and they will reprocess it. Sarah, I will put this back in your box.

## 2015-04-27 NOTE — Telephone Encounter (Signed)
Done

## 2015-04-28 NOTE — Telephone Encounter (Signed)
Faxed signed form back to [facility] w/confirmation.

## 2015-04-29 ENCOUNTER — Ambulatory Visit (INDEPENDENT_AMBULATORY_CARE_PROVIDER_SITE_OTHER): Payer: Self-pay

## 2015-04-29 ENCOUNTER — Other Ambulatory Visit: Payer: Self-pay | Admitting: Physician Assistant

## 2015-04-29 ENCOUNTER — Ambulatory Visit (INDEPENDENT_AMBULATORY_CARE_PROVIDER_SITE_OTHER): Payer: Self-pay | Admitting: Family Medicine

## 2015-04-29 VITALS — BP 136/76 | HR 75 | Temp 98.3°F | Resp 18 | Ht 65.5 in | Wt 262.2 lb

## 2015-04-29 DIAGNOSIS — S93401A Sprain of unspecified ligament of right ankle, initial encounter: Secondary | ICD-10-CM

## 2015-04-29 DIAGNOSIS — Z23 Encounter for immunization: Secondary | ICD-10-CM

## 2015-04-29 DIAGNOSIS — E785 Hyperlipidemia, unspecified: Secondary | ICD-10-CM

## 2015-04-29 DIAGNOSIS — M79671 Pain in right foot: Secondary | ICD-10-CM

## 2015-04-29 MED ORDER — MELOXICAM 7.5 MG PO TABS: 7.5000 mg | ORAL_TABLET | Freq: Every day | ORAL | Status: DC

## 2015-04-29 MED ORDER — PRAVASTATIN SODIUM 40 MG PO TABS: 40.0000 mg | ORAL_TABLET | Freq: Every evening | ORAL | Status: DC

## 2015-04-29 NOTE — Progress Notes (Signed)
[facility] [address] [place] [address] [phone]  Date:  04/29/2015   Name:  Rachel Kim   DOB:  Nov 08, 1955   MRN:  [id number]  PCP:  Jenny Reichmann, MD    History of Present Illness:  Rachel Kim is a 59 y.o. female patient who presents to [facility] for chief complaint of right ankle pain since this morning.  Yesterday morning, she twisted her ankle while walking down steps.  She did not feel anything until this morning.  She can bear weight but it gets more painful and swollen.  She states it feels better to walk on her heels.       She would also like medication refill of the pravastatin.  She has been without for about 1 month.  She denies chest pains, palpitations, sob, but has her usual leg swelling.  She states that she had her labs performed at [facility].   She would like a flu shot today.  No recent or current upper respirator infections.    Patient Active Problem List   Diagnosis Date Noted  . Overactive bladder 01/28/2015  . Nocturia 01/28/2015  . Headache 11/27/2014  . Accelerated hypertension 11/27/2014  . CKD (chronic kidney disease) stage 3, GFR 30-59 ml/min 11/27/2014  . Acute on chronic respiratory failure with hypoxia 11/27/2014  . DM type 2, uncontrolled, with renal complications 19/62/2297  . OSA on CPAP 12/19/2012  . Hypothyroidism 09/28/2006  . Controlled diabetes mellitus type 2 with complications 98/92/1194  . Mixed hyperlipidemia 09/28/2006  . Morbid obesity 09/28/2006    Past Medical History  Diagnosis Date  . Anxiety   . Depression   . Diabetes mellitus   . GERD (gastroesophageal reflux disease)   . Hyperlipidemia   . Hypertension   . Thyroid disease     hypothyroidism  . Sleep apnea     uses CPAP  . Sarcoidosis of lung   . Chronic kidney disease (CKD), stage III (moderate)   . RBBB   . LVH (left ventricular hypertrophy)     echo 02/25/10 EF >55%  . High cholesterol   . Hyperthyroidism     diagnosed 1989   . H/O breast biopsy 02/10/10    calcification , no tumors  . OSA (obstructive sleep apnea)     severe complex apnea  . CSA (central sleep apnea)   . Pneumonia 04/04    Past Surgical History  Procedure Laterality Date  . Tonsillectomy    . Appendectomy    . Retroperitoneal lymph node excision      right underarm  . Elbow fracture surgery      with pins and pins removed  . Rhinoplasty      has had several revisions  . Tubinates trimmed    . Laminectomy l5, s1    . Carpal tunnel release      right and left  . Rt knee arthroscopy    . Cholecystectomy    . Laminectomy c5-c6    . Abdominal hysterectomy  12/07    Social History  Substance Use Topics  . Smoking status: Never Smoker   . Smokeless tobacco: Never Used  . Alcohol Use: No    Family History  Problem Relation Age of Onset  . Colitis Father   . Thyroid disease Father   . Colon cancer Maternal Grandmother   . Hypertension Mother   . Stroke Mother   . Thyroid disease Mother   .  Thyroid disease Sister   . Thyroid disease Brother   . Thyroid disease Sister   . Thyroid disease Brother     Allergies  Allergen Reactions  . Doxazosin Swelling  . Labetalol Swelling  . Actos [Pioglitazone Hydrochloride] Swelling  . Amlodipine Swelling  . Cozaar [Losartan Potassium] Other (See Comments)    Causes drowsiness/zombie like  . Glipizide Swelling  . Imdur [Isosorbide Dinitrate] Other (See Comments)    Causes severe headaches  . Clindamycin Hcl Rash    Medication list has been reviewed and updated.  Current Outpatient Prescriptions on File Prior to Visit  Medication Sig Dispense Refill  . acetaminophen (TYLENOL) 500 MG tablet Take 500 mg by mouth every 6 (six) hours as needed.    Marland Kitchen aspirin 81 MG tablet Take 1 tablet (81 mg total) by mouth daily. 30 tablet 0  . benazepril (LOTENSIN) 20 MG tablet TAKE ONE TABLET BY MOUTH EVERY DAY 30 tablet 2  . Cholecalciferol (VITAMIN D) 2000 UNITS CAPS Take by mouth daily.       . clonazePAM (KLONOPIN) 0.5 MG tablet Take 0.5 tablets (0.25 mg total) by mouth at bedtime as needed for anxiety (sleep). Take 1/4 tablet as needed 20 tablet 0  . Dulaglutide (TRULICITY) 1.5 PJ/8.2NK SOPN Inject 1.5 mg into the skin daily. 12 pen 3  . escitalopram (LEXAPRO) 20 MG tablet Take 1.5 tablets (30 mg total) by mouth daily. Take 1 1/2 daily 135 tablet 1  . hydrALAZINE (APRESOLINE) 50 MG tablet Take 1 tablet (50 mg total) by mouth every 8 (eight) hours. 90 tablet 6  . indapamide (LOZOL) 1.25 MG tablet Take 1 tablet by mouth daily.  11  . Insulin Glargine (TOUJEO SOLOSTAR) 300 UNIT/ML SOPN Inject 22-100 Units into the skin daily. 21 pen 3  . Insulin Pen Needle (BD PEN NEEDLE NANO U/F) 32G X 4 MM MISC 1 application by Does not apply route daily. 100 each 3  . labetalol (NORMODYNE) 100 MG tablet Take 4 tablets (400 mg total) by mouth 2 (two) times daily. (Patient taking differently: Take 100 mg by mouth 2 (two) times daily. ) 60 tablet 1  . levothyroxine (SYNTHROID, LEVOTHROID) 125 MCG tablet Take 1 tablet (125 mcg total) by mouth daily before breakfast. 90 tablet 1  . Magnesium 250 MG TABS Take 1 tablet by mouth 2 (two) times daily.     . Multiple Vitamins-Minerals (PRESERVISION AREDS 2 PO) Take 1 tablet by mouth 2 (two) times daily.    . pravastatin (PRAVACHOL) 40 MG tablet Take 1 tablet (40 mg total) by mouth every evening. 90 tablet 0  . docusate sodium (COLACE) 100 MG capsule Take 100 mg by mouth 2 (two) times daily.    . insulin aspart (NOVOLOG) 100 UNIT/ML FlexPen Sliding scale   CBG 151 - 200: 2 units     CBG 201 - 250: 3 units     CBG 251 - 300: 5 units     CBG 301 - 350: 7 units     CBG 351 - 400 9 units (Patient not taking: Reported on 03/22/2015) 9 pen 3  . mirabegron ER (MYRBETRIQ) 50 MG TB24 tablet Take 1 tablet (50 mg total) by mouth daily. (Patient not taking: Reported on 03/22/2015) 90 tablet 3  . oxybutynin (DITROPAN) 5 MG tablet Take 1 tablet (5 mg total) by mouth 2 (two)  times daily. (Patient not taking: Reported on 03/22/2015) 60 tablet 0  . senna (SENOKOT) 8.6 MG tablet Take 1 tablet by mouth  daily.     No current facility-administered medications on file prior to visit.    ROS ROS otherwise unremarkable unless listed above.  Physical Examination: BP 136/76 mmHg  Pulse 75  Temp(Src) 98.3 F (36.8 C) (Oral)  Resp 18  Ht 5' 5.5" (1.664 m)  Wt 262 lb 4 oz (118.956 kg)  BMI 42.96 kg/m2  SpO2 98%  LMP 07/01/2006 Ideal Body Weight: Weight in (lb) to have BMI = 25: 152.2  Physical Exam  Constitutional: She is oriented to person, place, and time. She appears well-developed and well-nourished. No distress.  HENT:  Head: Normocephalic and atraumatic.  Right Ear: External ear normal.  Left Ear: External ear normal.  Eyes: Conjunctivae and EOM are normal. Pupils are equal, round, and reactive to light.  Cardiovascular: Normal rate.   Pulmonary/Chest: Effort normal. No respiratory distress.  Musculoskeletal:  Right ankle with mild ankle swelling along the right malleolus.  There is tenderness along the right anterior of malleolus and more pronounced tenderness along the talus.  She has pain with active dorsiflexion, plantar, and eversion.  Negative thompson.  Negative anterior drawer test.    Neurological: She is alert and oriented to person, place, and time.  Skin: She is not diaphoretic.  Psychiatric: She has a normal mood and affect. Her behavior is normal.   [facility] reading (PRIMARY) by  Dr. Carlota Raspberry Ankle: Talar neck and superior distal talus with slight bony regularity seen on lateral.   Foot: No acute bony findings.  Some possible midfoot degenerative change.  Some plantar spurs of the calcaneus.     Assessment and Plan: 59 year old with PMH listed above is here for chief complaint of left foot pain. Consistent with right ankle sprain at this time. Advised RICE at this time.  Placed in a cam walker short boot.  She will rtn in 10 days if she is not  any better.  -Flu vaccine given.   Right ankle sprain, initial encounter - Plan: DG Foot Complete Right, DG Ankle Complete Right, meloxicam (MOBIC) 7.5 MG tablet  Right foot pain  Need for influenza vaccination - Plan: Flu Vaccine QUAD 36+ mos IM  Hyperlipidemia - Plan: Lipid panel, pravastatin (PRAVACHOL) 40 MG tablet  Encounter for immunization  Ivar Drape, PA-C [facility] 9/28/20168:58 PM

## 2015-04-29 NOTE — Patient Instructions (Addendum)
Acute Ankle Sprain with Phase I Rehab An acute ankle sprain is a partial or complete tear in one or more of the ligaments of the ankle due to traumatic injury. The severity of the injury depends on both the number of ligaments sprained and the grade of sprain. There are 3 grades of sprains.   A grade 1 sprain is a mild sprain. There is a slight pull without obvious tearing. There is no loss of strength, and the muscle and ligament are the correct length.  A grade 2 sprain is a moderate sprain. There is tearing of fibers within the substance of the ligament where it connects two bones or two cartilages. The length of the ligament is increased, and there is usually decreased strength.  A grade 3 sprain is a complete rupture of the ligament and is uncommon. In addition to the grade of sprain, there are three types of ankle sprains.  Lateral ankle sprains: This is a sprain of one or more of the three ligaments on the outer side (lateral) of the ankle. These are the most common sprains. Medial ankle sprains: There is one large triangular ligament of the inner side (medial) of the ankle that is susceptible to injury. Medial ankle sprains are less common. Syndesmosis, "high ankle," sprains: The syndesmosis is the ligament that connects the two bones of the lower leg. Syndesmosis sprains usually only occur with very severe ankle sprains. SYMPTOMS  Pain, tenderness, and swelling in the ankle, starting at the side of injury that may progress to the whole ankle and foot with time.  "Pop" or tearing sensation at the time of injury.  Bruising that may spread to the heel.  Impaired ability to walk soon after injury. CAUSES   Acute ankle sprains are caused by trauma placed on the ankle that temporarily forces or pries the anklebone (talus) out of its normal socket.  Stretching or tearing of the ligaments that normally hold the joint in place (usually due to a twisting injury). RISK INCREASES  WITH:  Previous ankle sprain.  Sports in which the foot may land awkwardly (i.e., basketball, volleyball, or soccer) or walking or running on uneven or rough surfaces.  Shoes with inadequate support to prevent sideways motion when stress occurs.  Poor strength and flexibility.  Poor balance skills.  Contact sports. PREVENTION   Warm up and stretch properly before activity.  Maintain physical fitness:  Ankle and leg flexibility, muscle strength, and endurance.  Cardiovascular fitness.  Balance training activities.  Use proper technique and have a coach correct improper technique.  Taping, protective strapping, bracing, or high-top tennis shoes may help prevent injury. Initially, tape is best; however, it loses most of its support function within 10 to 15 minutes.  Wear proper-fitted protective shoes (High-top shoes with taping or bracing is more effective than either alone).  Provide the ankle with support during sports and practice activities for 12 months following injury. PROGNOSIS   If treated properly, ankle sprains can be expected to recover completely; however, the length of recovery depends on the degree of injury.  A grade 1 sprain usually heals enough in 5 to 7 days to allow modified activity and requires an average of 6 weeks to heal completely.  A grade 2 sprain requires 6 to 10 weeks to heal completely.  A grade 3 sprain requires 12 to 16 weeks to heal.  A syndesmosis sprain often takes more than 3 months to heal. RELATED COMPLICATIONS   Frequent recurrence of symptoms may   result in a chronic problem. Appropriately addressing the problem the first time decreases the frequency of recurrence and optimizes healing time. Severity of the initial sprain does not predict the likelihood of later instability.  Injury to other structures (bone, cartilage, or tendon).  A chronically unstable or arthritic ankle joint is a possibility with repeated  sprains. TREATMENT Treatment initially involves the use of ice, medication, and compression bandages to help reduce pain and inflammation. Ankle sprains are usually immobilized in a walking cast or boot to allow for healing. Crutches may be recommended to reduce pressure on the injury. After immobilization, strengthening and stretching exercises may be necessary to regain strength and a full range of motion. Surgery is rarely needed to treat ankle sprains. MEDICATION   Nonsteroidal anti-inflammatory medications, such as aspirin and ibuprofen (do not take for the first 3 days after injury or within 7 days before surgery), or other minor pain relievers, such as acetaminophen, are often recommended. Take these as directed by your caregiver. Contact your caregiver immediately if any bleeding, stomach upset, or signs of an allergic reaction occur from these medications.  Ointments applied to the skin may be helpful.  Pain relievers may be prescribed as necessary by your caregiver. Do not take prescription pain medication for longer than 4 to 7 days. Use only as directed and only as much as you need. HEAT AND COLD  Cold treatment (icing) is used to relieve pain and reduce inflammation for acute and chronic cases. Cold should be applied for 10 to 15 minutes every 2 to 3 hours for inflammation and pain and immediately after any activity that aggravates your symptoms. Use ice packs or an ice massage.  Heat treatment may be used before performing stretching and strengthening activities prescribed by your caregiver. Use a heat pack or a warm soak. SEEK IMMEDIATE MEDICAL CARE IF:   Pain, swelling, or bruising worsens despite treatment.  You experience pain, numbness, discoloration, or coldness in the foot or toes.  New, unexplained symptoms develop (drugs used in treatment may produce side effects.) EXERCISES  PHASE I EXERCISES RANGE OF MOTION (ROM) AND STRETCHING EXERCISES - Ankle Sprain, Acute Phase I,  Weeks 1 to 2 These exercises may help you when beginning to restore flexibility in your ankle. You will likely work on these exercises for the 1 to 2 weeks after your injury. Once your physician, physical therapist, or athletic trainer sees adequate progress, he or she will advance your exercises. While completing these exercises, remember:   Restoring tissue flexibility helps normal motion to return to the joints. This allows healthier, less painful movement and activity.  An effective stretch should be held for at least 30 seconds.  A stretch should never be painful. You should only feel a gentle lengthening or release in the stretched tissue. RANGE OF MOTION - Dorsi/Plantar Flexion  While sitting with your right / left knee straight, draw the top of your foot upwards by flexing your ankle. Then reverse the motion, pointing your toes downward.  Hold each position for __________ seconds.  After completing your first set of exercises, repeat this exercise with your knee bent. Repeat __________ times. Complete this exercise __________ times per day.  RANGE OF MOTION - Ankle Alphabet  Imagine your right / left big toe is a pen.  Keeping your hip and knee still, write out the entire alphabet with your "pen." Make the letters as large as you can without increasing any discomfort. Repeat __________ times. Complete this exercise __________   times per day.  STRENGTHENING EXERCISES - Ankle Sprain, Acute -Phase I, Weeks 1 to 2 These exercises may help you when beginning to restore strength in your ankle. You will likely work on these exercises for 1 to 2 weeks after your injury. Once your physician, physical therapist, or athletic trainer sees adequate progress, he or she will advance your exercises. While completing these exercises, remember:   Muscles can gain both the endurance and the strength needed for everyday activities through controlled exercises.  Complete these exercises as instructed by  your physician, physical therapist, or athletic trainer. Progress the resistance and repetitions only as guided.  You may experience muscle soreness or fatigue, but the pain or discomfort you are trying to eliminate should never worsen during these exercises. If this pain does worsen, stop and make certain you are following the directions exactly. If the pain is still present after adjustments, discontinue the exercise until you can discuss the trouble with your clinician. STRENGTH - Dorsiflexors  Secure a rubber exercise band/tubing to a fixed object (i.e., table, pole) and loop the other end around your right / left foot.  Sit on the floor facing the fixed object. The band/tubing should be slightly tense when your foot is relaxed.  Slowly draw your foot back toward you using your ankle and toes.  Hold this position for __________ seconds. Slowly release the tension in the band and return your foot to the starting position. Repeat __________ times. Complete this exercise __________ times per day.  STRENGTH - Plantar-flexors   Sit with your right / left leg extended. Holding onto both ends of a rubber exercise band/tubing, loop it around the ball of your foot. Keep a slight tension in the band.  Slowly push your toes away from you, pointing them downward.  Hold this position for __________ seconds. Return slowly, controlling the tension in the band/tubing. Repeat __________ times. Complete this exercise __________ times per day.  STRENGTH - Ankle Eversion  Secure one end of a rubber exercise band/tubing to a fixed object (table, pole). Loop the other end around your foot just before your toes.  Place your fists between your knees. This will focus your strengthening at your ankle.  Drawing the band/tubing across your opposite foot, slowly, pull your little toe out and up. Make sure the band/tubing is positioned to resist the entire motion.  Hold this position for __________ seconds. Have  your muscles resist the band/tubing as it slowly pulls your foot back to the starting position.  Repeat __________ times. Complete this exercise __________ times per day.  STRENGTH - Ankle Inversion  Secure one end of a rubber exercise band/tubing to a fixed object (table, pole). Loop the other end around your foot just before your toes.  Place your fists between your knees. This will focus your strengthening at your ankle.  Slowly, pull your big toe up and in, making sure the band/tubing is positioned to resist the entire motion.  Hold this position for __________ seconds.  Have your muscles resist the band/tubing as it slowly pulls your foot back to the starting position. Repeat __________ times. Complete this exercises __________ times per day.  STRENGTH - Towel Curls  Sit in a chair positioned on a non-carpeted surface.  Place your right / left foot on a towel, keeping your heel on the floor.  Pull the towel toward your heel by only curling your toes. Keep your heel on the floor.  If instructed by your physician, physical therapist,   or athletic trainer, add weight to the end of the towel. Repeat __________ times. Complete this exercise __________ times per day. Document Released: 02/16/2005 Document Revised: 12/02/2013 Document Reviewed: 10/30/2008 [facility] Patient Information 2015 [place], [place]. This information is not intended to replace advice given to you by your health care provider. Make sure you discuss any questions you have with your health care provider. Influenza Virus Vaccine injection (Fluarix) What is this medicine? INFLUENZA VIRUS VACCINE (in floo EN zuh VAHY ruhs vak SEEN) helps to reduce the risk of getting influenza also known as the flu. This medicine may be used for other purposes; ask your health care provider or pharmacist if you have questions. COMMON BRAND NAME(S): Fluarix, Fluzone What should I tell my health care provider before I take this  medicine? They need to know if you have any of these conditions: -bleeding disorder like hemophilia -fever or infection -Guillain-Barre syndrome or other neurological problems -immune system problems -infection with the human immunodeficiency virus (HIV) or AIDS -low blood platelet counts -multiple sclerosis -an unusual or allergic reaction to influenza virus vaccine, eggs, chicken proteins, latex, gentamicin, other medicines, foods, dyes or preservatives -pregnant or trying to get pregnant -breast-feeding How should I use this medicine? This vaccine is for injection into a muscle. It is given by a health care professional. A copy of Vaccine Information Statements will be given before each vaccination. Read this sheet carefully each time. The sheet may change frequently. Talk to your pediatrician regarding the use of this medicine in children. Special care may be needed. Overdosage: If you think you have taken too much of this medicine contact a poison control center or emergency room at once. NOTE: This medicine is only for you. Do not share this medicine with others. What if I miss a dose? This does not apply. What may interact with this medicine? -chemotherapy or radiation therapy -medicines that lower your immune system like etanercept, anakinra, infliximab, and adalimumab -medicines that treat or prevent blood clots like warfarin -phenytoin -steroid medicines like prednisone or cortisone -theophylline -vaccines This list may not describe all possible interactions. Give your health care provider a list of all the medicines, herbs, non-prescription drugs, or dietary supplements you use. Also tell them if you smoke, drink alcohol, or use illegal drugs. Some items may interact with your medicine. What should I watch for while using this medicine? Report any side effects that do not go away within 3 days to your doctor or health care professional. Call your health care provider if any  unusual symptoms occur within 6 weeks of receiving this vaccine. You may still catch the flu, but the illness is not usually as bad. You cannot get the flu from the vaccine. The vaccine will not protect against colds or other illnesses that may cause fever. The vaccine is needed every year. What side effects may I notice from receiving this medicine? Side effects that you should report to your doctor or health care professional as soon as possible: -allergic reactions like skin rash, itching or hives, swelling of the face, lips, or tongue Side effects that usually do not require medical attention (report to your doctor or health care professional if they continue or are bothersome): -fever -headache -muscle aches and pains -pain, tenderness, redness, or swelling at site where injected -weak or tired This list may not describe all possible side effects. Call your doctor for medical advice about side effects. You may report side effects to FDA at 1-800-FDA-1088. Where should I  keep my medicine? This vaccine is only given in a clinic, pharmacy, doctor's office, or other health care setting and will not be stored at home. NOTE: This sheet is a summary. It may not cover all possible information. If you have questions about this medicine, talk to your doctor, pharmacist, or health care provider.  2015, [employer]. (2008-02-13 09:30:40)

## 2015-04-30 LAB — LIPID PANEL
Cholesterol: 180 mg/dL (ref 125–200)
HDL: 34 mg/dL — AB (ref >=46)
LDL CALC: 113 mg/dL (ref <130)
TRIGLYCERIDES: 165 mg/dL — AB (ref <150)
Total CHOL/HDL Ratio: 5.3 Ratio — ABNORMAL HIGH (ref <=5.0)
VLDL: 33 mg/dL — AB (ref <30)

## 2015-05-01 NOTE — Telephone Encounter (Signed)
[facility] was also approved for pt. Notified pt on VM and copied all forms for scanning and left originals for pt in drawer.

## 2015-05-01 NOTE — Progress Notes (Signed)
Xray read and patient discussed with Ms. English. Agree with assessment and plan of care per her note.   

## 2015-05-01 NOTE — Telephone Encounter (Signed)
I just gave her this 2 days ago for a sprain.  14 tablets only. I do not know why she would need a 90 day supply.  Accidentally signed and contacted pharmacy to cancel.

## 2015-05-01 NOTE — Telephone Encounter (Signed)
Pharm reqs a 90 day supply. Colletta Maryland, please advise.

## 2015-05-05 ENCOUNTER — Encounter: Payer: Self-pay | Admitting: Emergency Medicine

## 2015-05-12 ENCOUNTER — Telehealth: Payer: Self-pay

## 2015-05-12 NOTE — Telephone Encounter (Signed)
Notified pt that her shipment of Toujeo has arrived and is at 104 for p/up. Faxed back signed packing slip.

## 2015-05-15 ENCOUNTER — Telehealth: Payer: Self-pay

## 2015-05-15 NOTE — Telephone Encounter (Signed)
Patient is calling to let Junie Panning know that she got her flu shot on 04/29/15

## 2015-05-21 ENCOUNTER — Encounter: Payer: Self-pay | Admitting: Cardiovascular Disease

## 2015-05-21 ENCOUNTER — Ambulatory Visit (INDEPENDENT_AMBULATORY_CARE_PROVIDER_SITE_OTHER): Payer: 59 | Admitting: Cardiovascular Disease

## 2015-05-21 VITALS — BP 124/86 | HR 72 | Resp 16 | Ht 65.0 in | Wt 261.0 lb

## 2015-05-21 DIAGNOSIS — Z79899 Other long term (current) drug therapy: Secondary | ICD-10-CM

## 2015-05-21 DIAGNOSIS — E785 Hyperlipidemia, unspecified: Secondary | ICD-10-CM

## 2015-05-21 NOTE — Progress Notes (Signed)
Patient ID: Rachel Kim, female   DOB: 1956/04/21, 59 y.o.   MRN: [id number]     Cardiology Office Note   Date:  05/23/2015   ID:  Rachel Kim, DOB [birth year], MRN [id number]  PCP:  Jenny Reichmann, MD  Cardiologist:   Sanda Klein, MD   Chief Complaint  Patient presents with  . Follow-up      History of Present Illness: Rachel Kim is a 59 y.o. female who presents for  Follow-up for multiple coronary risk factors that include hypertension, diabetes mellitus and hypercholesterolemia. Her health has improved. Her hemoglobin A1c has decreased from 11% in May to 6% in August. Her lipid profile is generally favorable, although she still has a low HDL cholesterol and very mild hypertriglyceridemia. Her creatinine is stable at around 1.6.  She wants to undergo bariatric surgery but is currently uninsured. She is exploring her options at the [facility] program. She sees Dr. Edrick Oh for moderate chronic kidney disease. Blood pressure control is excellent.  She has lost 8 pounds since August    Past Medical History  Diagnosis Date  . Anxiety   . Depression   . Diabetes mellitus   . GERD (gastroesophageal reflux disease)   . Hyperlipidemia   . Hypertension   . Thyroid disease     hypothyroidism  . Sleep apnea     uses CPAP  . Sarcoidosis of lung ([place])   . Chronic kidney disease (CKD), stage III (moderate)   . RBBB   . LVH (left ventricular hypertrophy)     echo 02/25/10 EF >55%  . High cholesterol   . Hyperthyroidism     diagnosed 1989  . H/O breast biopsy 02/10/10    calcification , no tumors  . OSA (obstructive sleep apnea)     severe complex apnea  . CSA (central sleep apnea)   . Pneumonia 04/04    Past Surgical History  Procedure Laterality Date  . Tonsillectomy    . Appendectomy    . Retroperitoneal lymph node excision      right underarm  . Elbow fracture surgery      with pins and pins removed  . Rhinoplasty      has had several revisions  . Tubinates  trimmed    . Laminectomy l5, s1    . Carpal tunnel release      right and left  . Rt knee arthroscopy    . Cholecystectomy    . Laminectomy c5-c6    . Abdominal hysterectomy  12/07     Current Outpatient Prescriptions  Medication Sig Dispense Refill  . acetaminophen (TYLENOL) 500 MG tablet Take 500 mg by mouth every 6 (six) hours as needed.    Marland Kitchen aspirin 81 MG tablet Take 1 tablet (81 mg total) by mouth daily. 30 tablet 0  . benazepril (LOTENSIN) 20 MG tablet TAKE ONE TABLET BY MOUTH EVERY DAY 30 tablet 2  . Cholecalciferol (VITAMIN D) 2000 UNITS CAPS Take by mouth daily.      . clonazePAM (KLONOPIN) 0.5 MG tablet Take 0.5 tablets (0.25 mg total) by mouth at bedtime as needed for anxiety (sleep). Take 1/4 tablet as needed 20 tablet 0  . Dulaglutide (TRULICITY) 1.5 HD/6.2IW SOPN Inject 1.5 mg into the skin daily. 12 pen 3  . escitalopram (LEXAPRO) 20 MG tablet Take 1.5 tablets (30 mg total) by mouth daily. Take 1 1/2 daily 135 tablet 1  . hydrALAZINE (APRESOLINE) 50 MG tablet Take 1 tablet (50  mg total) by mouth every 8 (eight) hours. 90 tablet 6  . indapamide (LOZOL) 1.25 MG tablet Take 1 tablet by mouth daily.  11  . insulin aspart (NOVOLOG) 100 UNIT/ML FlexPen Sliding scale   CBG 151 - 200: 2 units     CBG 201 - 250: 3 units     CBG 251 - 300: 5 units     CBG 301 - 350: 7 units     CBG 351 - 400 9 units 9 pen 3  . Insulin Glargine (TOUJEO SOLOSTAR) 300 UNIT/ML SOPN Inject 22-100 Units into the skin daily. 21 pen 3  . Insulin Pen Needle (BD PEN NEEDLE NANO U/F) 32G X 4 MM MISC 1 application by Does not apply route daily. 100 each 3  . labetalol (NORMODYNE) 100 MG tablet Take 4 tablets (400 mg total) by mouth 2 (two) times daily. (Patient taking differently: Take 100 mg by mouth 2 (two) times daily. ) 60 tablet 1  . levothyroxine (SYNTHROID, LEVOTHROID) 125 MCG tablet Take 1 tablet (125 mcg total) by mouth daily before breakfast. 90 tablet 1  . Magnesium 250 MG TABS Take 1 tablet by  mouth 2 (two) times daily.     . mirabegron ER (MYRBETRIQ) 50 MG TB24 tablet Take 1 tablet (50 mg total) by mouth daily. 90 tablet 3  . Multiple Vitamins-Minerals (PRESERVISION AREDS 2 PO) Take 1 tablet by mouth 2 (two) times daily.    . pravastatin (PRAVACHOL) 40 MG tablet Take 1 tablet (40 mg total) by mouth every evening. 90 tablet 1   No current facility-administered medications for this visit.    Allergies:   Doxazosin; Labetalol; Actos; Amlodipine; Cozaar; Glipizide; Imdur; and Clindamycin hcl    Social History:  The patient  reports that she has never smoked. She has never used smokeless tobacco. She reports that she does not drink alcohol or use illicit drugs.   Family History:  The patient's family history includes Colitis in her father; Colon cancer in her maternal grandmother; Hypertension in her mother; Stroke in her mother; Thyroid disease in her brother, brother, father, mother, sister, and sister.    ROS:  Please see the history of present illness.    Otherwise, review of systems positive for none.   All other systems are reviewed and negative.    PHYSICAL EXAM: VS:  BP 124/86 mmHg  Pulse 72  Resp 16  Ht 5\' 5"  (1.651 m)  Wt 261 lb (118.389 kg)  BMI 43.43 kg/m2  LMP 07/01/2006 , BMI Body mass index is 43.43 kg/(m^2).  General: Alert, oriented x3, no distress Head: no evidence of trauma, PERRL, EOMI, no exophtalmos or lid lag, no myxedema, no xanthelasma; normal ears, nose and oropharynx Neck: normal jugular venous pulsations and no hepatojugular reflux; brisk carotid pulses without delay and no carotid bruits Chest: clear to auscultation, no signs of consolidation by percussion or palpation, normal fremitus, symmetrical and full respiratory excursions Cardiovascular: normal position and quality of the apical impulse, regular rhythm, normal first and second heart sounds, no murmurs, rubs or gallops Abdomen: no tenderness or distention, no masses by palpation, no  abnormal pulsatility or arterial bruits, normal bowel sounds, no hepatosplenomegaly Extremities: no clubbing, cyanosis; 1-2+ ankle edema; 2+ radial, ulnar and brachial pulses bilaterally; 2+ right femoral, posterior tibial and dorsalis pedis pulses; 2+ left femoral, posterior tibial and dorsalis pedis pulses; no subclavian or femoral bruits Neurological: grossly nonfocal Psych: euthymic mood, full affect   EKG:  EKG is not ordered  today.   Recent Labs: 11/25/2014: ALT 26 11/26/2014: Hemoglobin 13.3; Platelets 167 11/29/2014: BUN 35*; Creatinine, Ser 1.63*; Potassium 4.1; Sodium 138 03/22/2015: TSH 4.600*    Lipid Panel    Component Value Date/Time   CHOL 180 04/29/2015 2035   TRIG 165* 04/29/2015 2035   HDL 34* 04/29/2015 2035   CHOLHDL 5.3* 04/29/2015 2035   VLDL 33* 04/29/2015 2035   LDLCALC 113 04/29/2015 2035      Wt Readings from Last 3 Encounters:  05/21/15 261 lb (118.389 kg)  04/29/15 262 lb 4 oz (118.956 kg)  03/22/15 269 lb (122.018 kg)      ASSESSMENT AND PLAN:  1.  Systemic hypertension well controlled.  After multiple iterations and confusing prescription changes, we seemed to finally hit upon a regimen that is both effective and acceptable to her from the side effect profile point of view 2.  Type 2 diabetes mellitus, insulin-requiring, greatly improved control 3.  Mixed hyperlipidemia improved and paralleled diabetes control , mild residual hypertriglyceridemia and low HDL 4.  Morbid obesity, slow improvement. I encouraged her in her desire to undergo [facility] surgery, but there may be financial/insurance barriers. 5.  Chronic kidney disease stage III , stable creatinine 6.  Treated hypothyroidism    Current medicines are reviewed at length with the patient today.  The patient does not have concerns regarding medicines.  The following changes have been made:  no change  Labs/ tests ordered today include:   Orders Placed This Encounter  Procedures  .  Lipid panel  . Comprehensive metabolic panel     Patient Instructions  If you need a refill on your cardiac medications before your next appointment, please call your pharmacy.  Your physician recommends that you return for lab work in: fasting at [place] lab in December.  Dr. Sallyanne Kuster recommends that you schedule a follow-up appointment in: One Year.         Mikael Spray, MD  05/23/2015 10:33 PM    Sanda Klein, MD, [facility] [phone] office [phone] pager

## 2015-05-21 NOTE — Patient Instructions (Signed)
If you need a refill on your cardiac medications before your next appointment, please call your pharmacy.  Your physician recommends that you return for lab work in: fasting at [place] lab in December.  Dr. Sallyanne Kuster recommends that you schedule a follow-up appointment in: One Year.

## 2015-05-27 NOTE — Telephone Encounter (Signed)
Error

## 2015-06-02 ENCOUNTER — Encounter: Payer: Self-pay | Admitting: Cardiovascular Disease

## 2015-07-08 ENCOUNTER — Ambulatory Visit: Payer: Self-pay | Admitting: Emergency Medicine

## 2015-07-08 VITALS — BP 170/90 | HR 93 | Temp 99.2°F | Resp 16 | Ht 66.0 in | Wt 266.0 lb

## 2015-07-08 DIAGNOSIS — E118 Type 2 diabetes mellitus with unspecified complications: Secondary | ICD-10-CM

## 2015-07-08 DIAGNOSIS — N183 Chronic kidney disease, stage 3 unspecified: Secondary | ICD-10-CM

## 2015-07-08 DIAGNOSIS — I10 Essential (primary) hypertension: Secondary | ICD-10-CM

## 2015-07-08 DIAGNOSIS — Z794 Long term (current) use of insulin: Secondary | ICD-10-CM

## 2015-07-08 NOTE — Patient Instructions (Signed)
Once you have insurance, or in about 3 months, plan to come in to see Windell Hummingbird, PA-C.

## 2015-07-08 NOTE — Progress Notes (Signed)
Patient ID: Rachel Kim, female    DOB: 1955/12/16, 59 y.o.   MRN: [id number]  PCP: Jenny Reichmann, MD  Subjective:   Chief Complaint  Patient presents with  . Advice Only    new PCP    HPI 1. Needs to establish with a new provider in preparation for Dr. Perfecto Kingdom retirement 06/2016. Has seen several other providers here, and needs to select one as her PCP going forward.  2. Concerns about weight loss surgery. Renal and Cardiac specialists have okay'd her for weight loss surgery. She needs to be in a job with health benefits before she can consider it further. She is concerned about malabsorption causing her already reduced renal function to worsen. Plans to have the procedure performed in [place].  Notes that her BP was elevated at rooming here today. BP has been well-controlled since this summer, using a new medication regimen. Last night she did not sleep at all-since she's been out of work, her sleep cycle has gotten off schedule. 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 She will have the opportunity to sign up for health insurance in several weeks and hopes that she can afford it.   Review of Systems Not performed.    Patient Active Problem List   Diagnosis Date Noted  . Overactive bladder 01/28/2015  . Nocturia 01/28/2015  . Headache 11/27/2014  . Accelerated hypertension 11/27/2014  . CKD (chronic kidney disease) stage 3, GFR 30-59 ml/min 11/27/2014  . Acute on chronic respiratory failure with hypoxia ([place]) 11/27/2014  . DM type 2, uncontrolled, with renal complications ([place]) [id number]  . Dermatitis seborrheica 12/26/2012  . OSA on CPAP 12/19/2012  . Acne 07/04/2011  . Personal history of diseases of skin or subcutaneous tissue 07/04/2011  . Neurodermatitis 07/04/2011  . Hyperpigmentation of skin, postinflammatory 07/04/2011  . 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 Hypothyroidism 09/28/2006  . Controlled diabetes mellitus type 2 with complications ([place])  [id number]  . Mixed hyperlipidemia 09/28/2006  . Morbid obesity ([place]) 09/28/2006     Prior to Admission medications   Medication Sig Start Date End Date Taking? Authorizing Provider  acetaminophen (TYLENOL) 500 MG tablet Take 500 mg by mouth every 6 (six) hours as needed.   Yes Historical Provider, MD  aspirin 81 MG tablet Take 1 tablet (81 mg total) by mouth daily. 11/28/14  Yes Robbie Lis, MD  benazepril (LOTENSIN) 20 MG tablet TAKE ONE TABLET BY MOUTH EVERY DAY 02/20/15  Yes Tereasa Coop, PA-C  Cholecalciferol (VITAMIN D) 2000 UNITS CAPS Take by mouth daily.     Yes Historical Provider, MD  clonazePAM (KLONOPIN) 0.5 MG tablet Take 0.5 tablets (0.25 mg total) by mouth at bedtime as needed for anxiety (sleep). Take 1/4 tablet as needed 03/14/15  Yes Sarah Alleen Borne, PA-C  Dulaglutide (TRULICITY) 1.5 0000000 SOPN Inject 1.5 mg into the skin daily. 01/26/15  Yes Mancel Bale, PA-C  escitalopram (LEXAPRO) 20 MG tablet Take 1.5 tablets (30 mg total) by mouth daily. Take 1 1/2 daily 01/26/15  Yes Sarah Alleen Borne, PA-C  hydrALAZINE (APRESOLINE) 50 MG tablet Take 1 tablet (50 mg total) by mouth every 8 (eight) hours. 02/10/15  Yes Mihai Croitoru, MD  indapamide (LOZOL) 1.25 MG tablet Take 1 tablet by mouth daily. 01/13/15  Yes Historical Provider, MD  insulin aspart (NOVOLOG) 100 UNIT/ML FlexPen Sliding scale   CBG 151 - 200: 2 units     CBG 201 - 250: 3 units  CBG 251 - 300: 5 units     CBG 301 - 350: 7 units     CBG 351 - 400 9 units 01/26/15  Yes Sarah Alleen Borne, PA-C  Insulin Glargine (TOUJEO SOLOSTAR) 300 UNIT/ML SOPN Inject 22-100 Units into the skin daily. 01/26/15  Yes Mancel Bale, PA-C  Insulin Pen Needle (BD PEN NEEDLE NANO U/F) 32G X 4 MM MISC 1 application by Does not apply route daily. 03/13/15  Yes Darlyne Russian, MD  labetalol (NORMODYNE) 100 MG tablet Take 4 tablets (400 mg total) by mouth 2 (two) times daily. Patient taking differently: Take 100 mg by mouth 2 (two) times daily.   12/08/14  Yes Tereasa Coop, PA-C  levothyroxine (SYNTHROID, LEVOTHROID) 125 MCG tablet Take 1 tablet (125 mcg total) by mouth daily before breakfast. 03/28/15  Yes Leandrew Koyanagi, MD  Magnesium 250 MG TABS Take 1 tablet by mouth 2 (two) times daily.    Yes Historical Provider, MD  mirabegron ER (MYRBETRIQ) 50 MG TB24 tablet Take 1 tablet (50 mg total) by mouth daily. 01/26/15  Yes Mancel Bale, PA-C  Multiple Vitamins-Minerals (PRESERVISION AREDS 2 PO) Take 1 tablet by mouth 2 (two) times daily.   Yes Historical Provider, MD  pravastatin (PRAVACHOL) 40 MG tablet Take 1 tablet (40 mg total) by mouth every evening. 04/29/15  Yes Dorian Heckle English, PA     Allergies  Allergen Reactions  . Doxazosin Swelling  . Labetalol Swelling  . Actos [Pioglitazone Hydrochloride] Swelling  . Amlodipine Swelling  . Cozaar [Losartan Potassium] Other (See Comments)    Causes drowsiness/zombie like  . Glipizide Swelling  . Imdur [Isosorbide Dinitrate] Other (See Comments)    Causes severe headaches  . Clindamycin Hcl Rash       Objective:  Physical Exam  Constitutional: She is oriented to person, place, and time. She appears well-developed and well-nourished. No distress.  BP 170/90 mmHg  Pulse 93  Temp(Src) 99.2 F (37.3 C) (Oral)  Resp 16  Ht 5\' 6"  (1.676 m)  Wt 266 lb (120.657 kg)  BMI 42.95 kg/m2  SpO2 98%  LMP 07/01/2006   Eyes: Conjunctivae are normal. No scleral icterus.  Neck: No thyromegaly present.  Cardiovascular: Normal rate, regular rhythm, normal heart sounds and intact distal pulses.   Pulmonary/Chest: Effort normal and breath sounds normal.  Lymphadenopathy:    She has no cervical adenopathy.  Neurological: She is alert and oriented to person, place, and time.  Skin: Skin is warm and dry.  Psychiatric: She has a normal mood and affect. Her behavior is normal.           Assessment & Plan:   1. Accelerated hypertension 2. CKD (chronic kidney disease) stage 3, GFR  30-59 ml/min 3. Controlled type 2 diabetes mellitus with complication, with long-term current use of insulin ([place]) After chart review and discussion of her visits over the past several years, she would like Windell Hummingbird, PA-C to serve as her PCP going forward. She will plan to follow-up with Ms. Weber in several months, and will hopefully have several visits before Dr. Perfecto Kingdom retirement in 06/2016 that will help ease her transition.  We reviewed available information about renal function following weight loss surgery and found no direct concerns that would prevent her from safely undergoing the procedure. She will, of course, discuss her concerns with the surgeon prior to scheduling.  Discussed with Dr. Everlene Farrier.   Fara Chute, PA-C Physician Assistant-Certified [facility]

## 2015-07-16 LAB — COMPREHENSIVE METABOLIC PANEL
ALT: 15 U/L (ref 6–29)
AST: 18 U/L (ref 10–35)
Albumin: 3.9 g/dL (ref 3.6–5.1)
Alkaline Phosphatase: 83 U/L (ref 33–130)
BUN: 26 mg/dL — AB (ref 7–25)
CHLORIDE: 100 mmol/L (ref 98–110)
CO2: 30 mmol/L (ref 20–31)
CREATININE: 1.44 mg/dL — AB (ref 0.50–1.05)
Calcium: 9.1 mg/dL (ref 8.6–10.4)
Glucose, Bld: 78 mg/dL (ref 65–99)
Potassium: 4.3 mmol/L (ref 3.5–5.3)
SODIUM: 140 mmol/L (ref 135–146)
Total Bilirubin: 0.5 mg/dL (ref 0.2–1.2)
Total Protein: 6.4 g/dL (ref 6.1–8.1)

## 2015-07-16 LAB — LIPID PANEL
CHOL/HDL RATIO: 4.2 ratio (ref <=5.0)
Cholesterol: 155 mg/dL (ref 125–200)
HDL: 37 mg/dL — AB (ref >=46)
LDL CALC: 84 mg/dL (ref <130)
Triglycerides: 170 mg/dL — ABNORMAL HIGH (ref <150)
VLDL: 34 mg/dL — ABNORMAL HIGH (ref <30)

## 2015-07-29 ENCOUNTER — Telehealth: Payer: Self-pay

## 2015-07-29 NOTE — Telephone Encounter (Signed)
Received fax from [facility] stating that pt is due for a refill of Novolog. Judson Roch had OKd RFs for a year (from June, 2016), so I called and authorized them sending a refill to our address for pt. Rep stated that the shipping dept has closed for the year and will resume shipping Jan 3rd. It should take 7-10 business days to receive.

## 2015-08-06 ENCOUNTER — Telehealth: Payer: Self-pay | Admitting: *Deleted

## 2015-08-06 NOTE — Telephone Encounter (Signed)
Pt notified that novolog is ready for pickup.  It is in the fridge at 102.

## 2015-08-17 ENCOUNTER — Encounter: Payer: Self-pay | Admitting: Physician Assistant

## 2015-08-20 ENCOUNTER — Encounter: Payer: Self-pay | Admitting: Physician Assistant

## 2015-08-21 NOTE — Telephone Encounter (Signed)
Spoke with [employer] - they send the medications to the office directly with a 4 months supply at a time - each supply needs to be requested.  This program allows 5 boxes with 5 pens each so a total of 25 pens.  She will get that sent out.

## 2015-08-25 ENCOUNTER — Telehealth: Payer: Self-pay | Admitting: *Deleted

## 2015-08-25 NOTE — Telephone Encounter (Signed)
Pt notified that trulicity is ready for pickup.  Med is in fridge at 104.  Please get Guerry Bruin to get medication when she gets here.

## 2015-09-03 ENCOUNTER — Other Ambulatory Visit: Payer: Self-pay | Admitting: Physician Assistant

## 2015-09-08 NOTE — Telephone Encounter (Signed)
Done

## 2015-09-09 NOTE — Telephone Encounter (Signed)
Faxed

## 2015-10-02 ENCOUNTER — Other Ambulatory Visit: Payer: Self-pay | Admitting: Cardiovascular Disease

## 2015-11-06 ENCOUNTER — Other Ambulatory Visit: Payer: Self-pay | Admitting: Physician Assistant

## 2015-11-23 ENCOUNTER — Ambulatory Visit (INDEPENDENT_AMBULATORY_CARE_PROVIDER_SITE_OTHER): Payer: Managed Care, Other (non HMO) | Admitting: Physician Assistant

## 2015-11-23 VITALS — BP 160/100 | HR 82 | Temp 98.1°F | Resp 20 | Ht 64.5 in | Wt 258.6 lb

## 2015-11-23 DIAGNOSIS — K529 Noninfective gastroenteritis and colitis, unspecified: Secondary | ICD-10-CM

## 2015-11-23 NOTE — Progress Notes (Signed)
Subjective:    Patient ID: Rachel Kim, female    DOB: 08-18-1955, 60 y.o.   MRN: [id number]  Chief Complaint  Patient presents with  . Nausea  . Vomiting / Diarrhea    x 3-4 days    HPI  Patient presents with 3 day history of vomiting and diarrhea.  Patient started vomiting last Friday afternoon. She ate a a new lunch buffet near her office for lunch that day. She denies any sick contacts. She had more than 5 episodes of vomiting from in a 24 hour span. She has not vomited since last Saturday afternoon. She has been able to keep down crackers, chicken broth, and fluids since without any nausea or vomiting.  She states that the diarrhea started approx 2 days ago after her vomiting stopped. She describes them as loose water stools. She has also had significant amount of gas. She reports generalized abdominal cramping that she relates to gas and vomiting. She has not had an episode of diarrhea since 2AM this morning. She has not tried anything for the diarrhea. Denies any fever, chill, or bloody stools.  Of note patient blood pressure was elevated in the office today. She has not been able to take any of her medication since 230 last Friday afternoon due to the vomiting.   Current Outpatient Prescriptions on File Prior to Visit  Medication Sig Dispense Refill  . acetaminophen (TYLENOL) 500 MG tablet Take 500 mg by mouth every 6 (six) hours as needed.    Marland Kitchen aspirin 81 MG tablet Take 1 tablet (81 mg total) by mouth daily. 30 tablet 0  . benazepril (LOTENSIN) 20 MG tablet TAKE ONE TABLET BY MOUTH EVERY DAY 30 tablet 2  . Cholecalciferol (VITAMIN D) 2000 UNITS CAPS Take by mouth daily.      . clonazePAM (KLONOPIN) 0.5 MG tablet TAKE 1/2 TABLET BY MOUTH AT BEDTIME AS NEEDED FOR ANXIETY. TAKE 1/4 TABLET AS NEEDED 20 tablet 0  . Dulaglutide (TRULICITY) 1.5 0000000 SOPN Inject 1.5 mg into the skin daily. 12 pen 3  . escitalopram (LEXAPRO) 20 MG tablet Take 1.5 tablets (30 mg total) by mouth  daily. Take 1 1/2 daily 135 tablet 1  . hydrALAZINE (APRESOLINE) 50 MG tablet TAKE 1 TABLET(50 MG) BY MOUTH EVERY 8 HOURS 90 tablet 2  . indapamide (LOZOL) 1.25 MG tablet Take 1 tablet by mouth daily.  11  . insulin aspart (NOVOLOG) 100 UNIT/ML FlexPen Sliding scale   CBG 151 - 200: 2 units     CBG 201 - 250: 3 units     CBG 251 - 300: 5 units     CBG 301 - 350: 7 units     CBG 351 - 400 9 units 9 pen 3  . Insulin Glargine (TOUJEO SOLOSTAR) 300 UNIT/ML SOPN Inject 22-100 Units into the skin daily. 21 pen 3  . Insulin Pen Needle (BD PEN NEEDLE NANO U/F) 32G X 4 MM MISC 1 application by Does not apply route daily. 100 each 3  . labetalol (NORMODYNE) 100 MG tablet Take 4 tablets (400 mg total) by mouth 2 (two) times daily. (Patient taking differently: Take 100 mg by mouth 2 (two) times daily. ) 60 tablet 1  . levothyroxine (SYNTHROID, LEVOTHROID) 125 MCG tablet Take 1 tablet (125 mcg total) by mouth daily before breakfast. 90 tablet 1  . Magnesium 250 MG TABS Take 1 tablet by mouth 2 (two) times daily.     . mirabegron ER (MYRBETRIQ) 50 MG  TB24 tablet Take 1 tablet (50 mg total) by mouth daily. 90 tablet 3  . Multiple Vitamins-Minerals (PRESERVISION AREDS 2 PO) Take 1 tablet by mouth 2 (two) times daily.    . pravastatin (PRAVACHOL) 40 MG tablet TAKE 1 TABLET(40 MG) BY MOUTH EVERY EVENING 90 tablet 0   No current facility-administered medications on file prior to visit.   Allergies  Allergen Reactions  . Doxazosin Swelling  . Labetalol Swelling  . Actos [Pioglitazone Hydrochloride] Swelling  . Amlodipine Swelling  . Cozaar [Losartan Potassium] Other (See Comments)    Causes drowsiness/zombie like  . Glipizide Swelling  . Imdur [Isosorbide Dinitrate] Other (See Comments)    Causes severe headaches  . Clindamycin Hcl Rash      Review of Systems  Constitutional: Negative for fever, chills and fatigue.  HENT: Negative.   Respiratory: Negative for cough and shortness of breath.     Cardiovascular: Negative for chest pain.  Gastrointestinal: Positive for nausea, vomiting, abdominal pain and diarrhea. Negative for constipation and blood in stool.  Musculoskeletal: Negative for myalgias.  Neurological: Negative for dizziness, syncope, weakness, light-headedness and headaches.       Objective:   Physical Exam  Constitutional: She is oriented to person, place, and time. She appears well-developed and well-nourished. No distress.  HENT:  Mouth/Throat: Mucous membranes are normal.  Eyes: Conjunctivae are normal. Pupils are equal, round, and reactive to light.  Neck: Normal range of motion. Neck supple.  Cardiovascular: Normal rate, regular rhythm, normal heart sounds and intact distal pulses.   Pulmonary/Chest: Effort normal and breath sounds normal.  Abdominal: Soft. Bowel sounds are normal. She exhibits no distension. There is tenderness (Generalized tenderness to deep palpation, patient describes it as gas pain). There is no rebound and no guarding.  Lymphadenopathy:    She has no cervical adenopathy.  Neurological: She is alert and oriented to person, place, and time.  Skin: Skin is warm and dry.  Psychiatric: She has a normal mood and affect. Her behavior is normal. Judgment and thought content normal.          Assessment & Plan:  1. Acute gastroenteritis Likely viral gastroenteritis. Patient is able to take in PO fluids and soft foods without nausea. She has not had any episodes of vomiting and diarrhea since 230 AM. Treat symptomatically. Imodium as needed. Po fluids and advance diet as tolerated. Get plenty of rest.   Melina Schools PA-S 11/23/2015

## 2015-11-23 NOTE — Patient Instructions (Addendum)
Continue to drink plenty of fluids. Advance diet as tolerated.   May use Imodium sparingly to help slow the diarrhea. May use 1 pill 2-3 per day.   Get plenty of rest.    IF you received an x-ray today, you will receive an invoice from [facility]. Please contact [facility] at [phone] with questions or concerns regarding your invoice.   IF you received labwork today, you will receive an invoice from [employer]. Please contact [facility] at [phone] with questions or concerns regarding your invoice.   Our billing staff will not be able to assist you with questions regarding bills from these companies.  You will be contacted with the lab results as soon as they are available. The fastest way to get your results is to activate your My Chart account. Instructions are located on the last page of this paperwork. If you have not heard from [place] regarding the results in 2 weeks, please contact this office.    Viral Gastroenteritis Viral gastroenteritis is also known as stomach flu. This condition affects the stomach and intestinal tract. It can cause sudden diarrhea and vomiting. The illness typically lasts 3 to 8 days. Most people develop an immune response that eventually gets rid of the virus. While this natural response develops, the virus can make you quite ill. CAUSES  Many different viruses can cause gastroenteritis, such as rotavirus or noroviruses. You can catch one of these viruses by consuming contaminated food or water. You may also catch a virus by sharing utensils or other personal items with an infected person or by touching a contaminated surface. SYMPTOMS  The most common symptoms are diarrhea and vomiting. These problems can cause a severe loss of body fluids (dehydration) and a body salt (electrolyte) imbalance. Other symptoms may include:  Fever.  Headache.  Fatigue.  Abdominal pain. DIAGNOSIS  Your caregiver can usually  diagnose viral gastroenteritis based on your symptoms and a physical exam. A stool sample may also be taken to test for the presence of viruses or other infections. TREATMENT  This illness typically goes away on its own. Treatments are aimed at rehydration. The most serious cases of viral gastroenteritis involve vomiting so severely that you are not able to keep fluids down. In these cases, fluids must be given through an intravenous line (IV). HOME CARE INSTRUCTIONS   Drink enough fluids to keep your urine clear or pale yellow. Drink small amounts of fluids frequently and increase the amounts as tolerated.  Ask your caregiver for specific rehydration instructions.  Avoid:  Foods high in sugar.  Alcohol.  Carbonated drinks.  Tobacco.  Juice.  Caffeine drinks.  Extremely hot or cold fluids.  Fatty, greasy foods.  Too much intake of anything at one time.  Dairy products until 24 to 48 hours after diarrhea stops.  You may consume probiotics. Probiotics are active cultures of beneficial bacteria. They may lessen the amount and number of diarrheal stools in adults. Probiotics can be found in yogurt with active cultures and in supplements.  Wash your hands well to avoid spreading the virus.  Only take over-the-counter or prescription medicines for pain, discomfort, or fever as directed by your caregiver. Do not give aspirin to children. Antidiarrheal medicines are not recommended.  Ask your caregiver if you should continue to take your regular prescribed and over-the-counter medicines.  Keep all follow-up appointments as directed by your caregiver. SEEK IMMEDIATE MEDICAL CARE IF:   You are unable to keep fluids down.  You do not urinate at least once every 6 to 8 hours.  You develop shortness of breath.  You notice blood in your stool or vomit. This may look like coffee grounds.  You have abdominal pain that increases or is concentrated in one small area  (localized).  You have persistent vomiting or diarrhea.  You have a fever.  The patient is a child younger than 3 months, and he or she has a fever.  The patient is a child older than 3 months, and he or she has a fever and persistent symptoms.  The patient is a child older than 3 months, and he or she has a fever and symptoms suddenly get worse.  The patient is a baby, and he or she has no tears when crying. MAKE SURE YOU:   Understand these instructions.  Will watch your condition.  Will get help right away if you are not doing well or get worse.   This information is not intended to replace advice given to you by your health care provider. Make sure you discuss any questions you have with your health care provider.   Document Released: 07/18/2005 Document Revised: 10/10/2011 Document Reviewed: 05/04/2011 Elsevier Interactive Patient Education [employer].

## 2015-11-23 NOTE — Progress Notes (Signed)
Patient ID: Rachel Kim, female    DOB: 02-21-1956, 60 y.o.   MRN: [id number]  PCP: Elizabeth Sauer  Subjective:   Chief Complaint  Patient presents with  . Nausea  . Vomiting / Diarrhea    x 3-4 days    HPI Presents for evaluation of day history of vomiting and diarrhea.  Patient started vomiting Friday (4/21) afternoon. She ate a a new lunch buffet near her office for lunch that day. She denies any sick contacts. She had more than 5 episodes of vomiting within a 24 hour span. She has not vomited since 4/22 afternoon. She has been able to keep down crackers, chicken broth, and fluids since without any nausea or vomiting.  She states that the diarrhea started approx 2 days ago after her vomiting stopped. She describes them as loose water stools. She has also had significant amount of gas. She reports generalized abdominal cramping that she relates to gas and vomiting. She has not had an episode of diarrhea since 2AM this morning. She has not tried anything for the diarrhea. Denies any fever, chill, or bloody stools.  Of note patient blood pressure was elevated in the office today. She has not been able to take any of her medication since the onset of her symptoms.   Review of Systems Constitutional: Negative for fever, chills and fatigue.  HENT: Negative.  Respiratory: Negative for cough and shortness of breath.  Cardiovascular: Negative for chest pain.  Gastrointestinal: Positive for nausea, vomiting, abdominal pain and diarrhea. Negative for constipation and blood in stool.  Musculoskeletal: Negative for myalgias.  Neurological: Negative for dizziness, syncope, weakness, light-headedness and headaches.       Patient Active Problem List   Diagnosis Date Noted  . Overactive bladder 01/28/2015  . Nocturia 01/28/2015  . Headache 11/27/2014  . Accelerated hypertension 11/27/2014  . CKD (chronic kidney disease) stage 3, GFR 30-59 ml/min 11/27/2014  . Dermatitis  seborrheica 12/26/2012  . OSA on CPAP 12/19/2012  . Acne 07/04/2011  . Personal history of diseases of skin or subcutaneous tissue 07/04/2011  . Neurodermatitis 07/04/2011  . Hyperpigmentation of skin, postinflammatory 07/04/2011  . 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 Hypothyroidism 09/28/2006  . Controlled diabetes mellitus type 2 with complications ([place]) Q000111Q  . Mixed hyperlipidemia 09/28/2006  . Morbid obesity ([place]) 09/28/2006     Prior to Admission medications   Medication Sig Start Date End Date Taking? Authorizing Provider  acetaminophen (TYLENOL) 500 MG tablet Take 500 mg by mouth every 6 (six) hours as needed.   Yes Historical Provider, MD  aspirin 81 MG tablet Take 1 tablet (81 mg total) by mouth daily. 11/28/14  Yes Robbie Lis, MD  benazepril (LOTENSIN) 20 MG tablet TAKE ONE TABLET BY MOUTH EVERY DAY 02/20/15  Yes Tereasa Coop, PA-C  Cholecalciferol (VITAMIN D) 2000 UNITS CAPS Take by mouth daily.     Yes Historical Provider, MD  clonazePAM (KLONOPIN) 0.5 MG tablet TAKE 1/2 TABLET BY MOUTH AT BEDTIME AS NEEDED FOR ANXIETY. TAKE 1/4 TABLET AS NEEDED 09/08/15  Yes Mancel Bale, PA-C  Dulaglutide (TRULICITY) 1.5 0000000 SOPN Inject 1.5 mg into the skin daily. 01/26/15  Yes Mancel Bale, PA-C  escitalopram (LEXAPRO) 20 MG tablet Take 1.5 tablets (30 mg total) by mouth daily. Take 1 1/2 daily 01/26/15  Yes Sarah Alleen Borne, PA-C  hydrALAZINE (APRESOLINE) 50 MG tablet TAKE 1 TABLET(50 MG) BY MOUTH EVERY 8 HOURS 10/05/15  Yes Mihai Croitoru, MD  indapamide (LOZOL)  1.25 MG tablet Take 1 tablet by mouth daily. 01/13/15  Yes Historical Provider, MD  insulin aspart (NOVOLOG) 100 UNIT/ML FlexPen Sliding scale   CBG 151 - 200: 2 units     CBG 201 - 250: 3 units     CBG 251 - 300: 5 units     CBG 301 - 350: 7 units     CBG 351 - 400 9 units 01/26/15  Yes Sarah Alleen Borne, PA-C  Insulin Glargine (TOUJEO SOLOSTAR) 300 UNIT/ML SOPN Inject 22-100 Units into the skin daily. 01/26/15  Yes Mancel Bale,  PA-C  Insulin Pen Needle (BD PEN NEEDLE NANO U/F) 32G X 4 MM MISC 1 application by Does not apply route daily. 03/13/15  Yes Darlyne Russian, MD  labetalol (NORMODYNE) 100 MG tablet Take 4 tablets (400 mg total) by mouth 2 (two) times daily. Patient taking differently: Take 100 mg by mouth 2 (two) times daily.  12/08/14  Yes Tereasa Coop, PA-C  levothyroxine (SYNTHROID, LEVOTHROID) 125 MCG tablet Take 1 tablet (125 mcg total) by mouth daily before breakfast. 03/28/15  Yes Leandrew Koyanagi, MD  Magnesium 250 MG TABS Take 1 tablet by mouth 2 (two) times daily.    Yes Historical Provider, MD  mirabegron ER (MYRBETRIQ) 50 MG TB24 tablet Take 1 tablet (50 mg total) by mouth daily. 01/26/15  Yes Mancel Bale, PA-C  Multiple Vitamins-Minerals (PRESERVISION AREDS 2 PO) Take 1 tablet by mouth 2 (two) times daily.   Yes Historical Provider, MD  ondansetron (ZOFRAN-ODT) 4 MG disintegrating tablet Take 4 mg by mouth every 8 (eight) hours as needed for nausea or vomiting.   Yes Historical Provider, MD  pravastatin (PRAVACHOL) 40 MG tablet TAKE 1 TABLET(40 MG) BY MOUTH EVERY EVENING 11/08/15  Yes Darlyne Russian, MD     Allergies  Allergen Reactions  . Doxazosin Swelling  . Labetalol Swelling  . Actos [Pioglitazone Hydrochloride] Swelling  . Amlodipine Swelling  . Cozaar [Losartan Potassium] Other (See Comments)    Causes drowsiness/zombie like  . Glipizide Swelling  . Imdur [Isosorbide Dinitrate] Other (See Comments)    Causes severe headaches  . Clindamycin Hcl Rash       Objective:  Physical Exam  Constitutional: She is oriented to person, place, and time. She appears well-developed and well-nourished. She is active and cooperative. No distress.  BP 160/100 mmHg  Pulse 82  Temp(Src) 98.1 F (36.7 C) (Oral)  Resp 20  Ht 5' 4.5" (1.638 m)  Wt 258 lb 9.6 oz (117.3 kg)  BMI 43.72 kg/m2  SpO2 97%  LMP 07/01/2006  HENT:  Head: Normocephalic and atraumatic.  Right Ear: Hearing normal.  Left Ear:  Hearing normal.  Eyes: Conjunctivae are normal. No scleral icterus.  Neck: Normal range of motion. Neck supple. No thyromegaly present.  Cardiovascular: Normal rate, regular rhythm and normal heart sounds.   Pulses:      Radial pulses are 2+ on the right side, and 2+ on the left side.  Pulmonary/Chest: Effort normal and breath sounds normal.  Abdominal: Soft. Normal appearance and bowel sounds are normal. There is no hepatosplenomegaly. There is generalized tenderness (mild).  Lymphadenopathy:       Head (right side): No tonsillar, no preauricular, no posterior auricular and no occipital adenopathy present.       Head (left side): No tonsillar, no preauricular, no posterior auricular and no occipital adenopathy present.    She has no cervical adenopathy.  Right: No supraclavicular adenopathy present.       Left: No supraclavicular adenopathy present.  Neurological: She is alert and oriented to person, place, and time. No sensory deficit.  Skin: Skin is warm, dry and intact. No rash noted. No cyanosis or erythema. Nails show no clubbing.  Psychiatric: She has a normal mood and affect. Her speech is normal and behavior is normal.           Assessment & Plan:   1. Acute gastroenteritis Supportive care.  Anticipatory guidance.  RTC if symptoms worsen/persist. Advance diet as tolerated. Resume her oral medications. If diarrhea persists, would collect specimens for O&P, Cx (C dif if >3 watery stools/day).   Fara Chute, PA-C Physician Assistant-Certified [facility]

## 2015-12-02 ENCOUNTER — Telehealth: Payer: Self-pay

## 2015-12-02 NOTE — Telephone Encounter (Signed)
Rachel Kim, I received a form from [facility] for new order for Trulicity to be supplied to pt. I have completed the form and put in your box for review/signature.

## 2015-12-03 NOTE — Telephone Encounter (Signed)
Form was completed and faxed w/confirmation. LMOM advising pt this has been done and the program will also req info from her to renew. Asked her to let us know if [employer] needs anything else from [place].

## 2015-12-03 NOTE — Telephone Encounter (Signed)
Done. Put in the Rx box where you used to sit

## 2015-12-07 ENCOUNTER — Encounter: Payer: Self-pay | Admitting: Gastroenterology

## 2015-12-10 ENCOUNTER — Telehealth: Payer: Self-pay

## 2015-12-10 NOTE — Telephone Encounter (Signed)
IC patient to advise 5 boxes of Trulicity were delivered today via refrigerated packaging. Pt states she will be here right at 6pm to pick up.  Box put at front desk for pick up.

## 2015-12-27 ENCOUNTER — Other Ambulatory Visit: Payer: Self-pay | Admitting: Cardiovascular Disease

## 2015-12-29 ENCOUNTER — Encounter: Payer: Self-pay | Admitting: Physician Assistant

## 2015-12-29 NOTE — Telephone Encounter (Signed)
hydrALAZINE (APRESOLINE) 50 MG tablet  Medication   Date: 10/05/2015  Department: [facility]  Ordering/Authorizing: Sanda Klein, MD      Order Providers    Prescribing Provider Encounter Provider   Sanda Klein, MD Sanda Klein, MD    Medication Detail      Disp Refills Start End     hydrALAZINE (APRESOLINE) 50 MG tablet 90 tablet 2 10/05/2015     Sig: TAKE 1 TABLET(50 MG) BY MOUTH EVERY 8 HOURS    E-Prescribing Status: Receipt confirmed by pharmacy (10/05/2015 12:50 PM EST)     Pharmacy    [id number] - [place], [place] - [address] AT [address]   HAD TO SEND CORRECT AMOUNT TO PHARMACY

## 2015-12-30 ENCOUNTER — Other Ambulatory Visit: Payer: Self-pay

## 2015-12-30 MED ORDER — BENAZEPRIL HCL 20 MG PO TABS: 20.0000 mg | ORAL_TABLET | Freq: Every day | ORAL | Status: DC

## 2016-01-17 ENCOUNTER — Encounter: Payer: Self-pay | Admitting: Physician Assistant

## 2016-01-18 ENCOUNTER — Other Ambulatory Visit: Payer: Self-pay

## 2016-01-18 DIAGNOSIS — F329 Major depressive disorder, single episode, unspecified: Secondary | ICD-10-CM

## 2016-01-18 DIAGNOSIS — F32A Depression, unspecified: Secondary | ICD-10-CM

## 2016-01-18 MED ORDER — ESCITALOPRAM OXALATE 20 MG PO TABS: 30.0000 mg | ORAL_TABLET | Freq: Every day | ORAL | Status: DC

## 2016-01-30 ENCOUNTER — Ambulatory Visit (INDEPENDENT_AMBULATORY_CARE_PROVIDER_SITE_OTHER): Payer: Managed Care, Other (non HMO) | Admitting: Physician Assistant

## 2016-01-30 VITALS — BP 158/96 | HR 88 | Temp 99.0°F | Resp 16 | Ht 64.0 in | Wt 274.2 lb

## 2016-01-30 DIAGNOSIS — L089 Local infection of the skin and subcutaneous tissue, unspecified: Secondary | ICD-10-CM

## 2016-01-30 DIAGNOSIS — L0889 Other specified local infections of the skin and subcutaneous tissue: Secondary | ICD-10-CM

## 2016-01-30 MED ORDER — CEPHALEXIN 500 MG PO CAPS: 500.0000 mg | ORAL_CAPSULE | Freq: Two times a day (BID) | ORAL | Status: DC

## 2016-01-30 NOTE — Progress Notes (Signed)
[facility] [address], [place] [address] [phone]  Date:  01/30/2016   Name:  Rachel Kim   DOB:  1955/12/28   MRN:  [id number]  PCP:  Elizabeth Sauer  Chief Complaint  Patient presents with  . Otalgia     History of Present Illness:  Rachel Kim is a 60 y.o. female patient who presents to [facility] for cc of earache.  Patient reports pain at her ear.  Feels like there is a bump at the ear.  She has attempted to push on it but it is not improving after several days.  No fever, throat pain, dizziness.     Patient Active Problem List   Diagnosis Date Noted  . Overactive bladder 01/28/2015  . Nocturia 01/28/2015  . Headache 11/27/2014  . Accelerated hypertension 11/27/2014  . CKD (chronic kidney disease) stage 3, GFR 30-59 ml/min 11/27/2014  . Dermatitis seborrheica 12/26/2012  . OSA on CPAP 12/19/2012  . Acne 07/04/2011  . Personal history of diseases of skin or subcutaneous tissue 07/04/2011  . Neurodermatitis 07/04/2011  . Hyperpigmentation of skin, postinflammatory 07/04/2011  . 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 Hypothyroidism 09/28/2006  . Controlled diabetes mellitus type 2 with complications ([place]) Q000111Q  . Mixed hyperlipidemia 09/28/2006  . Morbid obesity ([place]) 09/28/2006    Past Medical History  Diagnosis Date  . Anxiety   . Depression   . Diabetes mellitus   . GERD (gastroesophageal reflux disease)   . Hyperlipidemia   . Hypertension   . Thyroid disease     hypothyroidism  . Sleep apnea     uses CPAP  . Sarcoidosis of lung ([place])   . Chronic kidney disease (CKD), stage III (moderate)   . RBBB   . LVH (left ventricular hypertrophy)     echo 02/25/10 EF >55%  . High cholesterol   . Hyperthyroidism     diagnosed 1989  . H/O breast biopsy 02/10/10    calcification , no tumors  . OSA (obstructive sleep apnea)     severe complex apnea  . CSA (central sleep apnea)   . Pneumonia 04/04  . Acute on chronic respiratory failure  with hypoxia ([place]) 11/27/2014    Past Surgical History  Procedure Laterality Date  . Tonsillectomy    . Appendectomy    . Retroperitoneal lymph node excision      right underarm  . Elbow fracture surgery      with pins and pins removed  . Rhinoplasty      has had several revisions  . Tubinates trimmed    . Laminectomy l5, s1    . Carpal tunnel release      right and left  . Rt knee arthroscopy    . Cholecystectomy    . Laminectomy c5-c6    . Abdominal hysterectomy  12/07    Social History  Substance Use Topics  . Smoking status: Never Smoker   . Smokeless tobacco: Never Used  . Alcohol Use: No    Family History  Problem Relation Age of Onset  . Colitis Father   . Thyroid disease Father   . Colon cancer Maternal Grandmother   . Hypertension Mother   . Stroke Mother   . Thyroid disease Mother   . Thyroid disease Sister   . Thyroid disease Brother   . Thyroid disease Sister   . Thyroid disease Brother     Allergies  Allergen Reactions  . Doxazosin Swelling  .  Labetalol Swelling  . Actos [Pioglitazone Hydrochloride] Swelling  . Amlodipine Swelling  . Cozaar [Losartan Potassium] Other (See Comments)    Causes drowsiness/zombie like  . Glipizide Swelling  . Imdur [Isosorbide Dinitrate] Other (See Comments)    Causes severe headaches  . Clindamycin Hcl Rash    Medication list has been reviewed and updated.  Current Outpatient Prescriptions on File Prior to Visit  Medication Sig Dispense Refill  . acetaminophen (TYLENOL) 500 MG tablet Take 500 mg by mouth every 6 (six) hours as needed.    Marland Kitchen aspirin 81 MG tablet Take 1 tablet (81 mg total) by mouth daily. 30 tablet 0  . benazepril (LOTENSIN) 20 MG tablet Take 1 tablet (20 mg total) by mouth daily. 90 tablet 1  . Cholecalciferol (VITAMIN D) 2000 UNITS CAPS Take by mouth daily.      . clonazePAM (KLONOPIN) 0.5 MG tablet TAKE 1/2 TABLET BY MOUTH AT BEDTIME AS NEEDED FOR ANXIETY. TAKE 1/4 TABLET AS NEEDED 20 tablet  0  . Dulaglutide (TRULICITY) 1.5 0000000 SOPN Inject 1.5 mg into the skin daily. 12 pen 3  . escitalopram (LEXAPRO) 20 MG tablet Take 1.5 tablets (30 mg total) by mouth daily. Take 1 1/2 daily 45 tablet 0  . hydrALAZINE (APRESOLINE) 50 MG tablet TAKE 1 TABLET(50 MG) BY MOUTH EVERY 8 HOURS 270 tablet 2  . indapamide (LOZOL) 1.25 MG tablet Take 1 tablet by mouth daily.  11  . insulin aspart (NOVOLOG) 100 UNIT/ML FlexPen Sliding scale   CBG 151 - 200: 2 units     CBG 201 - 250: 3 units     CBG 251 - 300: 5 units     CBG 301 - 350: 7 units     CBG 351 - 400 9 units 9 pen 3  . Insulin Glargine (TOUJEO SOLOSTAR) 300 UNIT/ML SOPN Inject 22-100 Units into the skin daily. 21 pen 3  . labetalol (NORMODYNE) 100 MG tablet Take 4 tablets (400 mg total) by mouth 2 (two) times daily. (Patient taking differently: Take 100 mg by mouth 2 (two) times daily. ) 60 tablet 1  . levothyroxine (SYNTHROID, LEVOTHROID) 125 MCG tablet Take 1 tablet (125 mcg total) by mouth daily before breakfast. 90 tablet 1  . Magnesium 250 MG TABS Take 1 tablet by mouth 2 (two) times daily.     . mirabegron ER (MYRBETRIQ) 50 MG TB24 tablet Take 1 tablet (50 mg total) by mouth daily. 90 tablet 3  . Multiple Vitamins-Minerals (PRESERVISION AREDS 2 PO) Take 1 tablet by mouth 2 (two) times daily.    . pravastatin (PRAVACHOL) 40 MG tablet TAKE 1 TABLET(40 MG) BY MOUTH EVERY EVENING 90 tablet 0  . Insulin Pen Needle (BD PEN NEEDLE NANO U/F) 32G X 4 MM MISC 1 application by Does not apply route daily. 100 each 3  . ondansetron (ZOFRAN-ODT) 4 MG disintegrating tablet Take 4 mg by mouth every 8 (eight) hours as needed for nausea or vomiting. Reported on 01/30/2016     No current facility-administered medications on file prior to visit.    ROS ROS otherwise unremarkable unless listed above.   Physical Examination: BP 162/100 mmHg  Pulse 88  Temp(Src) 99 F (37.2 C) (Oral)  Resp 16  Ht 5\' 4"  (1.626 m)  Wt 274 lb 3.2 oz (124.376 kg)   BMI 47.04 kg/m2  SpO2 97%  LMP 07/01/2006 Ideal Body Weight: Weight in (lb) to have BMI = 25: 145.3  Physical Exam  Constitutional: She is oriented  to person, place, and time. She appears well-developed and well-nourished. No distress.  HENT:  Head: Normocephalic and atraumatic.  Right Ear: Tympanic membrane, external ear and ear canal normal.  Left Ear: Tympanic membrane and external ear normal.  Canal with swelling pustule at the external ear of the 3 oclock.  No drainage, and very tender. Lidocaine placed in the area.  Upon palpation, ample purulent drainage.      Eyes: Conjunctivae and EOM are normal. Pupils are equal, round, and reactive to light.  Cardiovascular: Normal rate.   Pulmonary/Chest: Effort normal. No respiratory distress.  Neurological: She is alert and oriented to person, place, and time.  Skin: She is not diaphoretic.  Psychiatric: She has a normal mood and affect. Her behavior is normal.     Assessment and Plan: Rachel Kim is a 60 y.o. female who is here today for cc of left ear. --placed on keflex.  Advised of alarmng symptoms to warrant an immediate return.   Skin pustule - Plan: cephALEXin (KEFLEX) 500 MG capsule  Ivar Drape, PA-C [facility] 01/30/2016 3:40 PM

## 2016-01-30 NOTE — Patient Instructions (Addendum)
     IF you received an x-ray today, you will receive an invoice from [facility]. Please contact [facility] at [phone] with questions or concerns regarding your invoice.   IF you received labwork today, you will receive an invoice from [employer]. Please contact [facility] at [phone] with questions or concerns regarding your invoice.   Our billing staff will not be able to assist you with questions regarding bills from these companies.  You will be contacted with the lab results as soon as they are available. The fastest way to get your results is to activate your My Chart account. Instructions are located on the last page of this paperwork. If you have not heard from [place] regarding the results in 2 weeks, please contact this office.    Take antibiotic as prescribed. Use warm compress at the ear 15 minutes three times per day. If you have no improvement of pain, swelling, fever, difficulty hearing--please return to the clinic.

## 2016-02-07 ENCOUNTER — Other Ambulatory Visit: Payer: Self-pay | Admitting: Emergency Medicine

## 2016-02-11 ENCOUNTER — Encounter: Payer: Self-pay | Admitting: Physician Assistant

## 2016-02-12 ENCOUNTER — Other Ambulatory Visit: Payer: Self-pay | Admitting: Physician Assistant

## 2016-02-16 MED ORDER — MIRABEGRON ER 50 MG PO TB24: 50.0000 mg | ORAL_TABLET | Freq: Every day | ORAL | Status: DC

## 2016-02-16 NOTE — Telephone Encounter (Signed)
Patient notified via My Chart.  Meds ordered this encounter  Medications  . mirabegron ER (MYRBETRIQ) 50 MG TB24 tablet    Sig: Take 1 tablet (50 mg total) by mouth daily.    Dispense:  30 tablet    Refill:  0    Order Specific Question:  Supervising Provider    Answer:  Brigitte Pulse, EVA N [[id number]]

## 2016-02-17 ENCOUNTER — Other Ambulatory Visit: Payer: Self-pay | Admitting: Physician Assistant

## 2016-02-17 NOTE — Telephone Encounter (Signed)
Faxed

## 2016-03-10 ENCOUNTER — Other Ambulatory Visit: Payer: Self-pay | Admitting: Physician Assistant

## 2016-03-11 ENCOUNTER — Other Ambulatory Visit: Payer: Self-pay | Admitting: Physician Assistant

## 2016-03-23 ENCOUNTER — Other Ambulatory Visit: Payer: Self-pay

## 2016-03-23 MED ORDER — LEVOTHYROXINE SODIUM 125 MCG PO TABS: 125.0000 ug | ORAL_TABLET | Freq: Every day | ORAL | 0 refills | Status: DC

## 2016-03-25 ENCOUNTER — Telehealth: Payer: Self-pay

## 2016-03-25 NOTE — Telephone Encounter (Signed)
Pt called back and stated that she would like to be re-enrolled in the program. I also asked her to check into the other 3 programs she was getting her meds through to make sure they so not need a re-enrollment done also. Pt agreed.

## 2016-03-25 NOTE — Telephone Encounter (Signed)
[facility] pt asst program faxed advising that pt's enrollment is ending 9/29. I see that in July, we sent a Rx for Myrbetriq to [facility] so not sure if pt still needs to be in this program, or would qualify. LMOM for pt to CB to advise.

## 2016-04-07 ENCOUNTER — Other Ambulatory Visit: Payer: Self-pay | Admitting: Physician Assistant

## 2016-04-16 ENCOUNTER — Ambulatory Visit (HOSPITAL_COMMUNITY)
Admission: EM | Admit: 2016-04-16 | Discharge: 2016-04-16 | Disposition: A | Discharge status: Home / Self Care | Payer: 59 | Attending: Family Medicine | Admitting: Family Medicine

## 2016-04-16 ENCOUNTER — Encounter (HOSPITAL_COMMUNITY): Payer: Self-pay | Admitting: Emergency Medicine

## 2016-04-16 DIAGNOSIS — G4700 Insomnia, unspecified: Secondary | ICD-10-CM

## 2016-04-16 DIAGNOSIS — Z760 Encounter for issue of repeat prescription: Secondary | ICD-10-CM

## 2016-04-16 MED ORDER — CLONAZEPAM 0.5 MG PO TABS: 0.5000 mg | ORAL_TABLET | Freq: Every day | ORAL | 0 refills | Status: DC

## 2016-04-16 MED ORDER — PRAVASTATIN SODIUM 40 MG PO TABS: 40.0000 mg | ORAL_TABLET | Freq: Every morning | ORAL | 0 refills | Status: DC

## 2016-04-16 NOTE — ED Provider Notes (Signed)
CSN: [id number]     Arrival date & time 04/16/16  1523 History   None    Chief Complaint  Patient presents with  . Insomnia  . Medication Refill  . Labs Only   (Consider location/radiation/quality/duration/timing/severity/associated sxs/prior Treatment) 60 y.o. female presents with for fasting labs and medicine refill for klonopin for sleeping and cholesterol medication. Condition is chronic  in nature .Patient states that she is unable to get labwork at her PCP since she works in [employer].        Past Medical History:  Diagnosis Date  . Acute on chronic respiratory failure with hypoxia ([place]) 11/27/2014  . Anxiety   . Chronic kidney disease (CKD), stage III (moderate)   . CSA (central sleep apnea)   . Depression   . Diabetes mellitus   . GERD (gastroesophageal reflux disease)   . H/O breast biopsy 02/10/10   calcification , no tumors  . High cholesterol   . Hyperlipidemia   . Hypertension   . Hyperthyroidism    diagnosed 1989  . LVH (left ventricular hypertrophy)    echo 02/25/10 EF >55%  . OSA (obstructive sleep apnea)    severe complex apnea  . Pneumonia 04/04  . RBBB   . Sarcoidosis of lung ([place])   . Sleep apnea    uses CPAP  . Thyroid disease    hypothyroidism   Past Surgical History:  Procedure Laterality Date  . ABDOMINAL HYSTERECTOMY  12/07  . APPENDECTOMY    . CARPAL TUNNEL RELEASE     right and left  . CHOLECYSTECTOMY    . ELBOW FRACTURE SURGERY     with pins and pins removed  . laminectomy c5-c6    . laminectomy l5, s1    . RETROPERITONEAL LYMPH NODE EXCISION     right underarm  . RHINOPLASTY     has had several revisions  . rt knee arthroscopy    . TONSILLECTOMY    . tubinates trimmed     Family History  Problem Relation Age of Onset  . Colitis Father   . Thyroid disease Father   . Colon cancer Maternal Grandmother   . Hypertension Mother   . Stroke Mother   . Thyroid disease Mother   . Thyroid disease Sister   . Thyroid disease  Brother   . Thyroid disease Sister   . Thyroid disease Brother    Social History  Substance Use Topics  . Smoking status: Never Smoker  . Smokeless tobacco: Never Used  . Alcohol use No   OB History    Gravida Para Term Preterm AB Living   0 0 0 0 0 0   SAB TAB Ectopic Multiple Live Births   0 0 0 0       Review of Systems  Allergies  Doxazosin; Labetalol; Actos [pioglitazone hydrochloride]; Amlodipine; Cozaar [losartan potassium]; Glipizide; Imdur [isosorbide dinitrate]; and Clindamycin hcl  Home Medications   Prior to Admission medications   Medication Sig Start Date End Date Taking? Authorizing Provider  acetaminophen (TYLENOL) 500 MG tablet Take 500 mg by mouth every 6 (six) hours as needed.   Yes Historical Provider, MD  aspirin 81 MG tablet Take 1 tablet (81 mg total) by mouth daily. 11/28/14  Yes Robbie Lis, MD  benazepril (LOTENSIN) 20 MG tablet Take 1 tablet (20 mg total) by mouth daily. 12/30/15  Yes Mancel Bale, PA-C  Cholecalciferol (VITAMIN D) 2000 UNITS CAPS Take by mouth daily.     Yes Historical  Provider, MD  Dulaglutide (TRULICITY) 1.5 0000000 SOPN Inject 1.5 mg into the skin daily. 01/26/15  Yes Sarah Alleen Borne, PA-C  escitalopram (LEXAPRO) 20 MG tablet TAKE 1 AND 1/2 TABLETS(30 MG) BY MOUTH DAILY 03/11/16  Yes Mancel Bale, PA-C  hydrALAZINE (APRESOLINE) 50 MG tablet TAKE 1 TABLET(50 MG) BY MOUTH EVERY 8 HOURS 12/29/15  Yes Mihai Croitoru, MD  indapamide (LOZOL) 1.25 MG tablet Take 1 tablet by mouth daily. 01/13/15  Yes Historical Provider, MD  Insulin Glargine (TOUJEO SOLOSTAR) 300 UNIT/ML SOPN Inject 22-100 Units into the skin daily. 01/26/15  Yes Mancel Bale, PA-C  labetalol (NORMODYNE) 100 MG tablet Take 4 tablets (400 mg total) by mouth 2 (two) times daily. Patient taking differently: Take 100 mg by mouth 2 (two) times daily.  12/08/14  Yes Tereasa Coop, PA-C  levothyroxine (SYNTHROID, LEVOTHROID) 125 MCG tablet Take 1 tablet (125 mcg total) by mouth  daily before breakfast. 03/23/16  Yes Jaynee Eagles, PA-C  Magnesium 250 MG TABS Take 1 tablet by mouth 2 (two) times daily.    Yes Historical Provider, MD  Multiple Vitamins-Minerals (PRESERVISION AREDS 2 PO) Take 1 tablet by mouth 2 (two) times daily.   Yes Historical Provider, MD  MYRBETRIQ 50 MG TB24 tablet TAKE 1 TABLET BY MOUTH DAILY 03/12/16  Yes Mancel Bale, PA-C  cephALEXin (KEFLEX) 500 MG capsule Take 1 capsule (500 mg total) by mouth 2 (two) times daily. 01/30/16   Dorian Heckle English, PA  clonazePAM (KLONOPIN) 0.5 MG tablet Take 1 tablet (0.5 mg total) by mouth daily. 04/16/16   [name], NP  insulin aspart (NOVOLOG) 100 UNIT/ML FlexPen Sliding scale   CBG 151 - 200: 2 units     CBG 201 - 250: 3 units     CBG 251 - 300: 5 units     CBG 301 - 350: 7 units     CBG 351 - 400 9 units 01/26/15   Mancel Bale, PA-C  Insulin Pen Needle (BD PEN NEEDLE NANO U/F) 32G X 4 MM MISC 1 application by Does not apply route daily. 03/13/15   Darlyne Russian, MD  ondansetron (ZOFRAN-ODT) 4 MG disintegrating tablet Take 4 mg by mouth every 8 (eight) hours as needed for nausea or vomiting. Reported on 01/30/2016    Historical Provider, MD  pravastatin (PRAVACHOL) 40 MG tablet Take 1 tablet (40 mg total) by mouth every morning. 04/16/16   [name], NP   Meds Ordered and Administered this Visit  Medications - No data to display  BP 166/86 (BP Location: Left Arm)   Pulse 74   Temp 99.1 F (37.3 C) (Oral)   Resp 16   LMP 07/01/2006   SpO2 97%  No data found.   Physical Exam  Urgent Care Course   Clinical Course    Procedures (including critical care time)  Labs Review Labs Reviewed - No data to display  Imaging Review No results found.   Visual Acuity Review  Right Eye Distance:   Left Eye Distance:   Bilateral Distance:    Right Eye Near:   Left Eye Near:    Bilateral Near:         MDM   1. Medication refill       [name], NP 04/16/16  1711

## 2016-04-16 NOTE — ED Triage Notes (Signed)
Pt c/o insomnia the last past 4 days... Reports she has been waking up late for work and when she's at work, she remains sleepy  Also reports she needs refills on meds and needing labs  Goes to [facility] but can't get in and was told to come in here  A&O x4.... NAD

## 2016-04-16 NOTE — Discharge Instructions (Signed)
Can obtain labs from [facility] if more convenient then doctors office

## 2016-04-16 NOTE — ED Notes (Signed)
Pt d/c by Jennifer O, NP  

## 2016-04-18 ENCOUNTER — Ambulatory Visit (INDEPENDENT_AMBULATORY_CARE_PROVIDER_SITE_OTHER): Payer: Managed Care, Other (non HMO) | Admitting: Urgent Care

## 2016-04-18 VITALS — BP 160/90 | HR 81 | Temp 97.9°F | Resp 17 | Ht 64.0 in | Wt 273.0 lb

## 2016-04-18 DIAGNOSIS — I10 Essential (primary) hypertension: Secondary | ICD-10-CM

## 2016-04-18 DIAGNOSIS — G4700 Insomnia, unspecified: Secondary | ICD-10-CM | POA: Diagnosis not present

## 2016-04-18 DIAGNOSIS — Z23 Encounter for immunization: Secondary | ICD-10-CM | POA: Diagnosis not present

## 2016-04-18 DIAGNOSIS — E119 Type 2 diabetes mellitus without complications: Secondary | ICD-10-CM | POA: Diagnosis not present

## 2016-04-18 DIAGNOSIS — F329 Major depressive disorder, single episode, unspecified: Secondary | ICD-10-CM | POA: Diagnosis not present

## 2016-04-18 DIAGNOSIS — E785 Hyperlipidemia, unspecified: Secondary | ICD-10-CM

## 2016-04-18 DIAGNOSIS — E039 Hypothyroidism, unspecified: Secondary | ICD-10-CM

## 2016-04-18 DIAGNOSIS — F32A Depression, unspecified: Secondary | ICD-10-CM

## 2016-04-18 MED ORDER — BENAZEPRIL HCL 20 MG PO TABS: 20.0000 mg | ORAL_TABLET | Freq: Every day | ORAL | 3 refills | Status: DC

## 2016-04-18 MED ORDER — HYDROXYZINE HCL 50 MG PO TABS: 50.0000 mg | ORAL_TABLET | Freq: Every evening | ORAL | 2 refills | Status: DC | PRN

## 2016-04-18 MED ORDER — INDAPAMIDE 1.25 MG PO TABS: 1.2500 mg | ORAL_TABLET | Freq: Every day | ORAL | 3 refills | Status: DC

## 2016-04-18 MED ORDER — LABETALOL HCL 100 MG PO TABS: 100.0000 mg | ORAL_TABLET | Freq: Two times a day (BID) | ORAL | 3 refills | Status: DC

## 2016-04-18 MED ORDER — PRAVASTATIN SODIUM 40 MG PO TABS: 40.0000 mg | ORAL_TABLET | Freq: Every morning | ORAL | 3 refills | Status: DC

## 2016-04-18 MED ORDER — HYDRALAZINE HCL 50 MG PO TABS: 50.0000 mg | ORAL_TABLET | Freq: Three times a day (TID) | ORAL | 3 refills | Status: DC

## 2016-04-18 MED ORDER — ESCITALOPRAM OXALATE 20 MG PO TABS: 30.0000 mg | ORAL_TABLET | Freq: Every day | ORAL | 5 refills | Status: DC

## 2016-04-18 NOTE — Progress Notes (Signed)
MRN: [id number] DOB: 02-29-1956  Subjective:   Rachel Kim is a 60 y.o. female presenting for chief complaint of Medication Refill (levothyroxine, benazepril, labetalol, indapamide, hydralazine, pravastatin, lexapro, clonazapam ) and Flu Vaccine  Thyroid - Managed with 176mcg levothyroxine, has been on this dose for the past year. Patient is a Energy manager, has missed work as a result. Also has intermittent constipation, fatigue.   HTN - Managed with benazepril, hydralazine, labetalol, indapamide. Admits that she has intermittent shortness of breath with strenuous activity which she believes is due to being out of shape. Also reports mild occasional lower leg swelling, heart racing. Denies dizziness, chronic headache, blurred vision, chest pain, heart racing, palpitations, nausea, vomiting, abdominal pain, hematuria. Patient sees Dr. Sallyanne Kuster, cardiology, and Dr. Edrick Oh, nephrology, for management of her blood pressure. Patient is on 1 year follow up with both. Denies smoking cigarettes or drinking alcohol.   HL - Managed with pravastatin. Sees Dr. Sallyanne Kuster for this. Patient admits dietary non-compliance.   Depression - Managed with escitalopram. Patient denies adverse effects. Reports that her mood is down, depressed. Feels that this is a situational depression, works with a Education officer, environmental, lost an opportunity to obtain a permanent job and has difficulty with the stress of looking for a secure job. Sleep cycle is very bizarre, states that some days she sleeps many hours and others experiences severe insomnia. Has a difficult time controlling worrying, having fast thoughts. She has used clonazepam with mixed results in the past. A full tablet really sedates the patient. Has also tried melatonin in the past, this did not work. Limits her caffeine intake. ROS as above. Denies HI, SI.  DM - patient does not currently need refills of her medications but would like to have her A1c  checked.  Rachel Kim has a current medication list which includes the following prescription(s): acetaminophen, aspirin, benazepril, vitamin d, clonazepam, dulaglutide, escitalopram, hydralazine, indapamide, insulin aspart, insulin glargine, insulin pen needle, labetalol, levothyroxine, magnesium, multiple vitamins-minerals, myrbetriq, and pravastatin. Also is allergic to doxazosin; labetalol; actos [pioglitazone hydrochloride]; amlodipine; cozaar [losartan potassium]; glipizide; imdur [isosorbide dinitrate]; and clindamycin hcl.  Rachel Kim  has a past medical history of Acute on chronic respiratory failure with hypoxia ([place]) (11/27/2014); Anxiety; Chronic kidney disease (CKD), stage III (moderate); CSA (central sleep apnea); Depression; Diabetes mellitus; GERD (gastroesophageal reflux disease); H/O breast biopsy (02/10/10); High cholesterol; Hyperlipidemia; Hypertension; Hyperthyroidism; LVH (left ventricular hypertrophy); OSA (obstructive sleep apnea); Pneumonia (04/04); RBBB; Sarcoidosis of lung ([place]); Sleep apnea; and Thyroid disease. Also  has a past surgical history that includes Tonsillectomy; Appendectomy; Retroperitoneal lymph node excision; Elbow fracture surgery; Rhinoplasty; tubinates trimmed; laminectomy l5, s1; Carpal tunnel release; rt knee arthroscopy; Cholecystectomy; laminectomy c5-c6; and Abdominal hysterectomy (12/07).  Objective:   Vitals: BP (!) 160/90 (BP Location: Right Arm, Patient Position: Sitting, Cuff Size: Large)   Pulse 81   Temp 97.9 F (36.6 C) (Oral)   Resp 17   Ht 5\' 4"  (1.626 m)   Wt 273 lb (123.8 kg)   LMP 07/01/2006   SpO2 93%   BMI 46.86 kg/m   BP Readings from Last 3 Encounters:  04/18/16 (!) 160/90  04/16/16 166/86  01/30/16 (!) 158/96   Wt Readings from Last 3 Encounters:  04/18/16 273 lb (123.8 kg)  01/30/16 274 lb 3.2 oz (124.4 kg)  11/23/15 258 lb 9.6 oz (117.3 kg)    Physical Exam  Constitutional: She is oriented to person, place, and time. She  appears well-developed and  well-nourished.  HENT:  Mouth/Throat: Oropharynx is clear and moist.  Eyes: No scleral icterus.  Neck: Normal range of motion. Neck supple. No thyromegaly present.  Cardiovascular: Normal rate, regular rhythm and intact distal pulses.  Exam reveals no gallop and no friction rub.   No murmur heard. Pulmonary/Chest: No respiratory distress. She has no wheezes. She has no rales.  Abdominal: Soft. Bowel sounds are normal. She exhibits no distension and no mass. There is no tenderness. There is no guarding.  Musculoskeletal: She exhibits no edema.  Neurological: She is alert and oriented to person, place, and time.  Skin: Skin is warm and dry.   Assessment and Plan :   1. Hypothyroidism, unspecified hypothyroidism type - Thyroid Panel With TSH pending, will make medication dosage adjustments as necessary.  2. Hyperlipidemia - Refill provided, f/u with cardiology.  3. Essential hypertension - Refilled current regimen. Patient has follow up with cardiology and nephrology in 05/2016. I counseled on worrisome symptoms warranting recheck prior to that. Patient verbalized understanding.   4. Type 2 diabetes mellitus without complication, without long-term current use of insulin (HCC) - Hemoglobin A1c pending.  5. Depression - Patient would like to stay with Lexapro. Refill provided.  6. Insomnia - Patient will stop clonazepam, start Vistaril.   7. Need for prophylactic vaccination and inoculation against influenza - Flu Vaccine QUAD 36+ mos IM   Jaynee Eagles, PA-C [facility] [phone] 04/18/2016 5:27 PM

## 2016-04-18 NOTE — Patient Instructions (Addendum)
Insomnia Insomnia is a sleep disorder that makes it difficult to fall asleep or to stay asleep. Insomnia can cause tiredness (fatigue), low energy, difficulty concentrating, mood swings, and poor performance at work or school.  There are three different ways to classify insomnia:  Difficulty falling asleep.  Difficulty staying asleep.  Waking up too early in the morning. Any type of insomnia can be long-term (chronic) or short-term (acute). Both are common. Short-term insomnia usually lasts for three months or less. Chronic insomnia occurs at least three times a week for longer than three months. CAUSES  Insomnia may be caused by another condition, situation, or substance, such as:  Anxiety.  Certain medicines.  Gastroesophageal reflux disease (GERD) or other gastrointestinal conditions.  Asthma or other breathing conditions.  Restless legs syndrome, sleep apnea, or other sleep disorders.  Chronic pain.  Menopause. This may include hot flashes.  Stroke.  Abuse of alcohol, tobacco, or illegal drugs.  Depression.  Caffeine.   Neurological disorders, such as Alzheimer disease.  An overactive thyroid (hyperthyroidism). The cause of insomnia may not be known. RISK FACTORS Risk factors for insomnia include:  Gender. Women are more commonly affected than men.  Age. Insomnia is more common as you get older.  Stress. This may involve your professional or personal life.  Income. Insomnia is more common in people with lower income.  Lack of exercise.   Irregular work schedule or night shifts.  Traveling between different time zones. SIGNS AND SYMPTOMS If you have insomnia, trouble falling asleep or trouble staying asleep is the main symptom. This may lead to other symptoms, such as:  Feeling fatigued.  Feeling nervous about going to sleep.  Not feeling rested in the morning.  Having trouble concentrating.  Feeling irritable, anxious, or depressed. TREATMENT   Treatment for insomnia depends on the cause. If your insomnia is caused by an underlying condition, treatment will focus on addressing the condition. Treatment may also include:   Medicines to help you sleep.  Counseling or therapy.  Lifestyle adjustments. HOME CARE INSTRUCTIONS   Take medicines only as directed by your health care provider.  Keep regular sleeping and waking hours. Avoid naps.  Keep a sleep diary to help you and your health care provider figure out what could be causing your insomnia. Include:   When you sleep.  When you wake up during the night.  How well you sleep.   How rested you feel the next day.  Any side effects of medicines you are taking.  What you eat and drink.   Make your bedroom a comfortable place where it is easy to fall asleep:  Put up shades or special blackout curtains to block light from outside.  Use a white noise machine to block noise.  Keep the temperature cool.   Exercise regularly as directed by your health care provider. Avoid exercising right before bedtime.  Use relaxation techniques to manage stress. Ask your health care provider to suggest some techniques that may work well for you. These may include:  Breathing exercises.  Routines to release muscle tension.  Visualizing peaceful scenes.  Cut back on alcohol, caffeinated beverages, and cigarettes, especially close to bedtime. These can disrupt your sleep.  Do not overeat or eat spicy foods right before bedtime. This can lead to digestive discomfort that can make it hard for you to sleep.  Limit screen use before bedtime. This includes:  Watching TV.  Using your smartphone, tablet, and computer.  Stick to a routine. This   can help you fall asleep faster. Try to do a quiet activity, brush your teeth, and go to bed at the same time each night.  Get out of bed if you are still awake after 15 minutes of trying to sleep. Keep the lights down, but try reading or  doing a quiet activity. When you feel sleepy, go back to bed.  Make sure that you drive carefully. Avoid driving if you feel very sleepy.  Keep all follow-up appointments as directed by your health care provider. This is important. SEEK MEDICAL CARE IF:   You are tired throughout the day or have trouble in your daily routine due to sleepiness.  You continue to have sleep problems or your sleep problems get worse. SEEK IMMEDIATE MEDICAL CARE IF:   You have serious thoughts about hurting yourself or someone else.   This information is not intended to replace advice given to you by your health care provider. Make sure you discuss any questions you have with your health care provider.   Document Released: 07/15/2000 Document Revised: 04/08/2015 Document Reviewed: 04/18/2014 Elsevier Interactive Patient Education 2016 [place].    Hydroxyzine capsules or tablets What is this medicine? HYDROXYZINE (hye [place] i zeen) is an antihistamine. This medicine is used to treat allergy symptoms. It is also used to treat anxiety and tension. This medicine can be used with other medicines to induce sleep before surgery. This medicine may be used for other purposes; ask your health care provider or pharmacist if you have questions. What should I tell my health care provider before I take this medicine? They need to know if you have any of these conditions: -any chronic illness -difficulty passing urine -glaucoma -heart disease -kidney disease -liver disease -lung disease -an unusual or allergic reaction to hydroxyzine, cetirizine, other medicines, foods, dyes, or preservatives -pregnant or trying to get pregnant -breast-feeding How should I use this medicine? Take this medicine by mouth with a full glass of water. Follow the directions on the prescription label. You may take this medicine with food or on an empty stomach. Take your medicine at regular intervals. Do not take your medicine more  often than directed. Talk to your pediatrician regarding the use of this medicine in children. Special care may be needed. While this drug may be prescribed for children as young as 36 years of age for selected conditions, precautions do apply. Patients over 55 years old may have a stronger reaction and need a smaller dose. Overdosage: If you think you have taken too much of this medicine contact a poison control center or emergency room at once. NOTE: This medicine is only for you. Do not share this medicine with others. What if I miss a dose? If you miss a dose, take it as soon as you can. If it is almost time for your next dose, take only that dose. Do not take double or extra doses. What may interact with this medicine? -alcohol -barbiturate medicines for sleep or seizures -medicines for colds, allergies -medicines for depression, anxiety, or emotional disturbances -medicines for pain -medicines for sleep -muscle relaxants This list may not describe all possible interactions. Give your health care provider a list of all the medicines, herbs, non-prescription drugs, or dietary supplements you use. Also tell them if you smoke, drink alcohol, or use illegal drugs. Some items may interact with your medicine. What should I watch for while using this medicine? Tell your doctor or health care professional if your symptoms do not improve.  You may get drowsy or dizzy. Do not drive, use machinery, or do anything that needs mental alertness until you know how this medicine affects you. Do not stand or sit up quickly, especially if you are an older patient. This reduces the risk of dizzy or fainting spells. Alcohol may interfere with the effect of this medicine. Avoid alcoholic drinks. Your mouth may get dry. Chewing sugarless gum or sucking hard candy, and drinking plenty of water may help. Contact your doctor if the problem does not go away or is severe. This medicine may cause dry eyes and blurred  vision. If you wear contact lenses you may feel some discomfort. Lubricating drops may help. See your eye doctor if the problem does not go away or is severe. If you are receiving skin tests for allergies, tell your doctor you are using this medicine. What side effects may I notice from receiving this medicine? Side effects that you should report to your doctor or health care professional as soon as possible: -fast or irregular heartbeat -difficulty passing urine -seizures -slurred speech or confusion -tremor Side effects that usually do not require medical attention (report to your doctor or health care professional if they continue or are bothersome): -constipation -drowsiness -fatigue -headache -stomach upset This list may not describe all possible side effects. Call your doctor for medical advice about side effects. You may report side effects to FDA at 1-800-FDA-1088. Where should I keep my medicine? Keep out of the reach of children. Store at room temperature between 15 and 30 degrees C (59 and 86 degrees F). Keep container tightly closed. Throw away any unused medicine after the expiration date. NOTE: This sheet is a summary. It may not cover all possible information. If you have questions about this medicine, talk to your doctor, pharmacist, or health care provider.    2016, Elsevier/Gold Standard. (2007-11-30 14:50:59)     IF you received an x-ray today, you will receive an invoice from [facility]. Please contact [facility] at [phone] with questions or concerns regarding your invoice.   IF you received labwork today, you will receive an invoice from [employer]. Please contact [facility] at [phone] with questions or concerns regarding your invoice.   Our billing staff will not be able to assist you with questions regarding bills from these companies.  You will be contacted with the lab results as soon as they are  available. The fastest way to get your results is to activate your My Chart account. Instructions are located on the last page of this paperwork. If you have not heard from [place] regarding the results in 2 weeks, please contact this office.    We recommend that you schedule a mammogram for breast cancer screening. Typically, you do not need a referral to do this. Please contact a local imaging center to schedule your mammogram.  [facility] - [phone]  *ask for the Radiology Department [facility] - [phone] or [phone]  [facility] - [phone] [place] [phone] [facility] - [phone]  *ask for the [facility] - [phone]  *ask for the Radiology Department [facility] - [phone]  *ask for the [facility] - [phone]

## 2016-04-19 ENCOUNTER — Telehealth: Payer: Self-pay

## 2016-04-19 LAB — COMPLETE METABOLIC PANEL WITH GFR
ALT: 15 U/L (ref 6–29)
AST: 18 U/L (ref 10–35)
Albumin: 4.2 g/dL (ref 3.6–5.1)
Alkaline Phosphatase: 89 U/L (ref 33–130)
BILIRUBIN TOTAL: 0.5 mg/dL (ref 0.2–1.2)
BUN: 28 mg/dL — ABNORMAL HIGH (ref 7–25)
CO2: 25 mmol/L (ref 20–31)
CREATININE: 1.53 mg/dL — AB (ref 0.50–0.99)
Calcium: 8.8 mg/dL (ref 8.6–10.4)
Chloride: 103 mmol/L (ref 98–110)
GFR, Est African American: 42 mL/min — ABNORMAL LOW (ref >=60)
GFR, Est Non African American: 37 mL/min — ABNORMAL LOW (ref >=60)
GLUCOSE: 90 mg/dL (ref 65–99)
Potassium: 4.1 mmol/L (ref 3.5–5.3)
SODIUM: 140 mmol/L (ref 135–146)
TOTAL PROTEIN: 6.8 g/dL (ref 6.1–8.1)

## 2016-04-19 LAB — LIPID PANEL
Cholesterol: 187 mg/dL (ref 125–200)
HDL: 37 mg/dL — ABNORMAL LOW (ref >=46)
LDL CALC: 116 mg/dL (ref <130)
Total CHOL/HDL Ratio: 5.1 Ratio — ABNORMAL HIGH (ref <=5.0)
Triglycerides: 168 mg/dL — ABNORMAL HIGH (ref <150)
VLDL: 34 mg/dL — ABNORMAL HIGH (ref <30)

## 2016-04-19 LAB — THYROID PANEL WITH TSH
FREE THYROXINE INDEX: 2 (ref 1.4–3.8)
T3 UPTAKE: 32 % (ref 22–35)
T4 TOTAL: 6.3 ug/dL (ref 4.5–12.0)
TSH: 1.83 m[IU]/L

## 2016-04-19 NOTE — Telephone Encounter (Signed)
Patient gave wrong information on her synthroid yesterday.  IT IS 125 MCG not 100  Mani

## 2016-04-20 ENCOUNTER — Other Ambulatory Visit: Payer: Self-pay | Admitting: Urgent Care

## 2016-04-20 ENCOUNTER — Encounter: Payer: Self-pay | Admitting: Urgent Care

## 2016-04-20 LAB — HEMOGLOBIN A1C
Hgb A1c MFr Bld: 5.8 % — ABNORMAL HIGH (ref <5.7)
MEAN PLASMA GLUCOSE: 120 mg/dL

## 2016-04-20 MED ORDER — LEVOTHYROXINE SODIUM 125 MCG PO TABS
125.0000 ug | ORAL_TABLET | Freq: Every day | ORAL | 3 refills | Status: DC
Start: 2016-04-20 — End: 2017-04-11

## 2016-05-04 ENCOUNTER — Telehealth: Payer: Self-pay

## 2016-05-04 NOTE — Telephone Encounter (Signed)
Spoke to pt about renewing pt assist programs for her medications. Pt will check with all other assist programs other than [employer] and get the paperwork needed to fill out and bring it to me. [employer] has sent a letter and I called them to fax form necessary for renewal.  Completed the provider portion of form and will put in Sarah's box for signature.

## 2016-05-04 NOTE — Telephone Encounter (Signed)
This correct Rx was sent in on 9/20

## 2016-05-07 NOTE — Telephone Encounter (Signed)
Done

## 2016-05-10 NOTE — Telephone Encounter (Signed)
Scanned copy of provider page and left for pt in drawer for p/up. Notified her by VM.

## 2016-05-25 ENCOUNTER — Encounter: Payer: Self-pay | Admitting: Cardiovascular Disease

## 2016-05-25 ENCOUNTER — Ambulatory Visit (INDEPENDENT_AMBULATORY_CARE_PROVIDER_SITE_OTHER): Payer: 59 | Admitting: Cardiovascular Disease

## 2016-05-25 VITALS — BP 140/90 | HR 72 | Ht 64.0 in | Wt 276.2 lb

## 2016-05-25 DIAGNOSIS — E118 Type 2 diabetes mellitus with unspecified complications: Secondary | ICD-10-CM

## 2016-05-25 DIAGNOSIS — N183 Chronic kidney disease, stage 3 unspecified: Secondary | ICD-10-CM

## 2016-05-25 DIAGNOSIS — Z79899 Other long term (current) drug therapy: Secondary | ICD-10-CM

## 2016-05-25 DIAGNOSIS — Z794 Long term (current) use of insulin: Secondary | ICD-10-CM

## 2016-05-25 DIAGNOSIS — I10 Essential (primary) hypertension: Secondary | ICD-10-CM

## 2016-05-25 DIAGNOSIS — G4733 Obstructive sleep apnea (adult) (pediatric): Secondary | ICD-10-CM

## 2016-05-25 DIAGNOSIS — E782 Mixed hyperlipidemia: Secondary | ICD-10-CM

## 2016-05-25 MED ORDER — PRAVASTATIN SODIUM 80 MG PO TABS: 80.0000 mg | ORAL_TABLET | Freq: Every morning | ORAL | 3 refills | Status: DC

## 2016-05-25 MED ORDER — HYDRALAZINE HCL 50 MG PO TABS: 75.0000 mg | ORAL_TABLET | Freq: Three times a day (TID) | ORAL | 3 refills | Status: DC

## 2016-05-25 NOTE — Patient Instructions (Signed)
Medication Instructions: Dr Sallyanne Kuster has recommended making the following medication changes: 1. INCREASE Hydralazine to 75 mg three times daily 2. INCREASE Pravastatin to 80 mg at bedtime  Labwork: Your physician recommends that you return for lab work in 3 months - FASTING.  Testing/Procedures: NONE ORDERED  Follow-up: Dr Sallyanne Kuster recommends that you schedule a follow-up appointment in 1 year. You will receive a reminder letter in the mail two months in advance. If you don't receive a letter, please call our office to schedule the follow-up appointment.  If you need a refill on your cardiac medications before your next appointment, please call your pharmacy.

## 2016-05-25 NOTE — Progress Notes (Signed)
Cardiology Office Note    Date:  05/25/2016   ID:  CHALISE RYSKAMP, DOB August 26, 1955, MRN [id number]  PCP:  Elizabeth Sauer  Cardiologist:   Sanda Klein, MD   Chief Complaint  Patient presents with  . Follow-up    pt c/o sob on exrtion    History of Present Illness:  ATHYNA Kim is a 60 y.o. female with morbid obesity associated with multiple complications including hearts to control hypertension, diabetes mellitus requiring insulin, hyperlipidemia and obstructive sleep apnea.  She has had more problems with sleep but this has improved with adjustment in her CPAP settings. Dr. Justin Mend recommended that she increase her dose of hydralazine to 75 mg 3 times a day but she didn't do that yet, since she did not get a new prescription. I explained to her that there is no 75 mg tablet-will have to take 1-1/2 tablets of the 50 mg prescription. She was taking Myrbetriq and this helped with incontinence, but stopped this about a month ago when she ran out. I mentioned to her that this medication can increase her blood pressure and this worried her.  She is still working in a temporary job but is attempting to find more permanent employment that will offer insurance benefits. Remains interested in bariatric surgery.  The patient specifically denies any chest pain at rest exertion, dyspnea at rest or with exertion, orthopnea, paroxysmal nocturnal dyspnea, syncope, palpitations, focal neurological deficits, intermittent claudication, lower extremity edema, unexplained weight gain, cough, hemoptysis or wheezing.  Her glycemic control is dramatically improved, but her LDL cholesterol has increased, which she attributes to poor compliance with her diet.   Past Medical History:  Diagnosis Date  . Acute on chronic respiratory failure with hypoxia ([place]) 11/27/2014  . Anxiety   . Chronic kidney disease (CKD), stage III (moderate)   . CSA (central sleep apnea)   . Depression   . Diabetes mellitus   .  GERD (gastroesophageal reflux disease)   . H/O breast biopsy 02/10/10   calcification , no tumors  . High cholesterol   . Hyperlipidemia   . Hypertension   . Hyperthyroidism    diagnosed 1989  . LVH (left ventricular hypertrophy)    echo 02/25/10 EF >55%  . OSA (obstructive sleep apnea)    severe complex apnea  . Pneumonia 04/04  . RBBB   . Sarcoidosis of lung ([place])   . Sleep apnea    uses CPAP  . Thyroid disease    hypothyroidism    Past Surgical History:  Procedure Laterality Date  . ABDOMINAL HYSTERECTOMY  12/07  . APPENDECTOMY    . CARPAL TUNNEL RELEASE     right and left  . CHOLECYSTECTOMY    . ELBOW FRACTURE SURGERY     with pins and pins removed  . laminectomy c5-c6    . laminectomy l5, s1    . RETROPERITONEAL LYMPH NODE EXCISION     right underarm  . RHINOPLASTY     has had several revisions  . rt knee arthroscopy    . TONSILLECTOMY    . tubinates trimmed      Current Medications: Outpatient Medications Prior to Visit  Medication Sig Dispense Refill  . acetaminophen (TYLENOL) 500 MG tablet Take 500 mg by mouth every 6 (six) hours as needed.    Marland Kitchen aspirin 81 MG tablet Take 1 tablet (81 mg total) by mouth daily. 30 tablet 0  . benazepril (LOTENSIN) 20 MG tablet Take 1 tablet (20 mg  total) by mouth daily. 90 tablet 3  . Cholecalciferol (VITAMIN D) 2000 UNITS CAPS Take by mouth daily.      . Dulaglutide (TRULICITY) 1.5 0000000 SOPN Inject 1.5 mg into the skin daily. 12 pen 3  . escitalopram (LEXAPRO) 20 MG tablet Take 1.5 tablets (30 mg total) by mouth daily. 45 tablet 5  . hydrOXYzine (ATARAX/VISTARIL) 50 MG tablet Take 1-1.5 tablets (50-75 mg total) by mouth at bedtime as needed. 60 tablet 2  . indapamide (LOZOL) 1.25 MG tablet Take 1 tablet (1.25 mg total) by mouth daily. 30 tablet 3  . insulin aspart (NOVOLOG) 100 UNIT/ML FlexPen Sliding scale   CBG 151 - 200: 2 units     CBG 201 - 250: 3 units     CBG 251 - 300: 5 units     CBG 301 - 350: 7 units      CBG 351 - 400 9 units 9 pen 3  . Insulin Glargine (TOUJEO SOLOSTAR) 300 UNIT/ML SOPN Inject 22-100 Units into the skin daily. 21 pen 3  . Insulin Pen Needle (BD PEN NEEDLE NANO U/F) 32G X 4 MM MISC 1 application by Does not apply route daily. 100 each 3  . labetalol (NORMODYNE) 100 MG tablet Take 1 tablet (100 mg total) by mouth 2 (two) times daily. 180 tablet 3  . levothyroxine (SYNTHROID, LEVOTHROID) 125 MCG tablet Take 1 tablet (125 mcg total) by mouth daily before breakfast. 90 tablet 3  . Magnesium 250 MG TABS Take 1 tablet by mouth 2 (two) times daily.     . Multiple Vitamins-Minerals (PRESERVISION AREDS 2 PO) Take 1 tablet by mouth 2 (two) times daily.    Marland Kitchen MYRBETRIQ 50 MG TB24 tablet TAKE 1 TABLET BY MOUTH DAILY 30 tablet 0  . hydrALAZINE (APRESOLINE) 50 MG tablet Take 1 tablet (50 mg total) by mouth 3 (three) times daily. 270 tablet 3  . pravastatin (PRAVACHOL) 40 MG tablet Take 1 tablet (40 mg total) by mouth every morning. 90 tablet 3   No facility-administered medications prior to visit.      Allergies:   Doxazosin; Labetalol; Actos [pioglitazone hydrochloride]; Amlodipine; Cozaar [losartan potassium]; Glipizide; Imdur [isosorbide dinitrate]; and Clindamycin hcl   Social History   Social History  . Marital status: Single    Spouse name: n/a  . Number of children: 0  . Years of education: Master's   Occupational History  . Quality Engineer    Social History Main Topics  . Smoking status: Never Smoker  . Smokeless tobacco: Never Used  . Alcohol use No  . Drug use: No  . Sexual activity: No     Comment: hysterectomy   Other Topics Concern  . None   Social History Narrative   Lives alone with 2 kitties.   Unemployed for a while. To start a new job 07/2015.           Family History:  The patient's family history includes Colitis in her father; Colon cancer in her maternal grandmother; Hypertension in her mother; Stroke in her mother; Thyroid disease in her brother,  brother, father, mother, sister, and sister.   ROS:   Please see the history of present illness.    ROS All other systems reviewed and are negative.   PHYSICAL EXAM:   VS:  BP 140/90 (BP Location: Right Wrist, Patient Position: Sitting, Cuff Size: Normal)   Pulse 72   Ht 5\' 4"  (1.626 m)   Wt 276 lb 3.2 oz (125.3 kg)  LMP 07/01/2006   BMI 47.41 kg/m     recheck blood pressure 150/88 mmHg GEN: Morbidly obese, well developed, in no acute distress  HEENT: normal  Neck: no JVD, carotid bruits, or masses Cardiac: Paradoxically split S2 RRR; no murmurs, rubs, or gallops,no edema  Respiratory:  clear to auscultation bilaterally, normal work of breathing GI: soft, nontender, nondistended, + BS MS: no deformity or atrophy  Skin: warm and dry, no rash Neuro:  Alert and Oriented x 3, Strength and sensation are intact Psych: euthymic mood, full affect  Wt Readings from Last 3 Encounters:  05/25/16 276 lb 3.2 oz (125.3 kg)  04/18/16 273 lb (123.8 kg)  01/30/16 274 lb 3.2 oz (124.4 kg)      Studies/Labs Reviewed:   EKG:  EKG is ordered today.  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 Essential hypertension   2. Mixed hyperlipidemia   3. Controlled type 2 diabetes mellitus with complication, with long-term current use of insulin (HCC)   4. Stage 3 chronic kidney disease   5. Morbid obesity ([place])   6. OSA (obstructive sleep apnea)   7. Medication management      PLAN:  In order of problems listed above:  1. HTN: BP remains  elevated. Gave her the new prescription for hydralazine 75 mg 3 times daily as recommended by Dr. Justin Mend. Target blood pressure 130/80 or less. Asked her to be cautious when she restarts treatment with Myrbetriq and report elevated blood pressure promptly. 2. HLP: She believes the deterioration in LDL cholesterol is related to less compliance with a healthy diet, but I recommended that we increase the pravastatin to 80 mg every evening. I think she is at very high risk for development of coronary and other vascular complications. 3. DM: Now with excellent, possibly even excessive glycemic control. She does not report symptoms of hypoglycemia. Discussed the symptoms and asked her to report them promptly. 4. CKD: Renal function has shown a slight improvement over the last couple of years. 5. Morbid obesity: She is trying to find a job that will offer medical insurance and allow her to pursue her wish for bariatric surgery at [facility]. 6. OSA: Compliant with CPAP    Medication Adjustments/Labs and Tests Ordered: Current medicines are reviewed at length with the patient today.  Concerns regarding medicines are outlined above.  Medication changes, Labs and Tests ordered today are listed in the Patient Instructions below. Patient Instructions  Medication Instructions: Dr Sallyanne Kuster has recommended making the following medication changes: 1. INCREASE Hydralazine to 75 mg three times daily 2. INCREASE Pravastatin to 80 mg at bedtime  Labwork: Your physician recommends that you return for lab work in 3 months - FASTING.  Testing/Procedures: NONE ORDERED  Follow-up: Dr Sallyanne Kuster recommends that you schedule a follow-up appointment in 1 year. You will receive a reminder letter in the mail two months in advance. If you don't receive a letter, please call our office to schedule the follow-up appointment.  If you  need a refill on your cardiac medications before your next appointment, please call your  pharmacy.    Signed, Sanda Klein, MD  05/25/2016 8:52 AM    [facility] [place], [place], [place]  [address] Phone: [phone]; Fax: [phone]

## 2016-06-03 ENCOUNTER — Telehealth: Payer: Self-pay

## 2016-06-03 NOTE — Telephone Encounter (Signed)
Rachel Kim, I put a Pt Asst form in your box (in [place] envelope w/others) from [employer]. Please see Highlighted areas for you to complete/sign. 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 The bottom section of pt 7 says it only needs to be completed if you do not include a printed Rx, which I pended. I changed the sig to once a wk (it prev said once a day), but quantity was correct.

## 2016-06-03 NOTE — Telephone Encounter (Signed)
Completed pt asst form for Toujeo and placed in Sarah's box in [place] envelope with other forms. Please sign, then fax all forms/scan copy and leave origs for pt  P/up.

## 2016-06-07 ENCOUNTER — Other Ambulatory Visit: Payer: Self-pay | Admitting: Physician Assistant

## 2016-06-07 MED ORDER — DULAGLUTIDE 1.5 MG/0.5ML ~~LOC~~ SOAJ: 1.5000 mg | SUBCUTANEOUS | 3 refills | Status: DC

## 2016-06-07 NOTE — Telephone Encounter (Signed)
I think I did them all right and printed out the trulicity Rx.  Please make sure I did everything before she picks up.

## 2016-06-09 NOTE — Telephone Encounter (Signed)
All 3 Pt asst forms were faxed and received confirmation for all. Copied all forms to be scanned and left originals for pt p/up along with fax confirmations. Tried to call pt but her VM has not been set upl

## 2016-06-13 NOTE — Telephone Encounter (Signed)
Called both cell and home #s again to advise forms have been faxed and originals are ready for p/up. Cell VM not set up, Home VM is full.

## 2016-06-14 NOTE — Telephone Encounter (Signed)
Still could not reach pt, same as below. Sent unable to reach letter and mailed her copies of pt asst forms.

## 2016-07-12 NOTE — Telephone Encounter (Signed)
Fax dated 06/17/2016 states Insurance does not provide coverage for product requested

## 2016-07-20 ENCOUNTER — Telehealth: Payer: Self-pay | Admitting: Physician Assistant

## 2016-07-20 ENCOUNTER — Telehealth: Payer: Self-pay | Admitting: *Deleted

## 2016-07-20 NOTE — Telephone Encounter (Signed)
Called patient to let her know the medication Toujeo will be at [address] for pick up. She can come tomorrow (Thursday) for pick up or Judson Roch will be at [address] on Friday (pm) or Saturday all day until 4 pm.

## 2016-07-20 NOTE — Telephone Encounter (Signed)
Please let pt know that her Toujeo is in the refrigerator at [address] - she can come into the clinic to pick up.

## 2016-07-28 NOTE — Telephone Encounter (Signed)
Pt came into [address] to pick up Toujeo and I spoke to her about her other two Pt Assist meds. She needed me to re-fax the forms w/her new financial info that she provided. Faxed both with confirmation. Left message for pt that I will leave orig forms w/confirmations for her to p/up at [address].

## 2016-08-06 NOTE — Telephone Encounter (Signed)
Lm with this info also mybetriq is approved through [place] program.

## 2016-08-08 ENCOUNTER — Other Ambulatory Visit: Payer: Self-pay | Admitting: Urgent Care

## 2016-08-08 DIAGNOSIS — I10 Essential (primary) hypertension: Secondary | ICD-10-CM

## 2016-08-11 ENCOUNTER — Telehealth: Payer: Self-pay | Admitting: Family Medicine

## 2016-08-11 NOTE — Telephone Encounter (Signed)
Pt calling Rachel Kim back about medication

## 2016-08-14 ENCOUNTER — Encounter: Payer: Self-pay | Admitting: Physician Assistant

## 2016-08-15 IMAGING — CT CT HEAD W/O CM
2 series · 15 of 30 positions shown, 19 images · non-contrast
Comparison: 02/03/2009 CT.

CLINICAL DATA: 58-year-old hypertensive diabetic female with
hyperlipidemia and sarcoidosis presenting with nausea, vomiting and
headache for the past 4 days. Initial encounter.

EXAM:
CT HEAD WITHOUT CONTRAST
TECHNIQUE: Contiguous axial images were obtained from the base of the skull
through the vertex without intravenous contrast.

[Series 2: head w/o · axial · non-contrast · 0.45mm/px · z∈[+1483,+1603]mm · 13 of 28 slices shown, 17 images]
[im 2/28  brain]
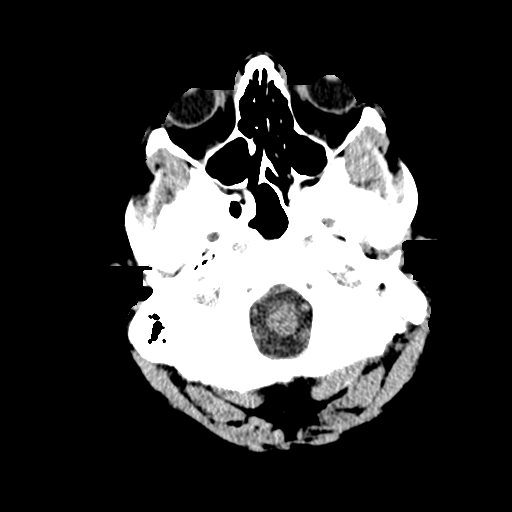
[im 2/28  bone]
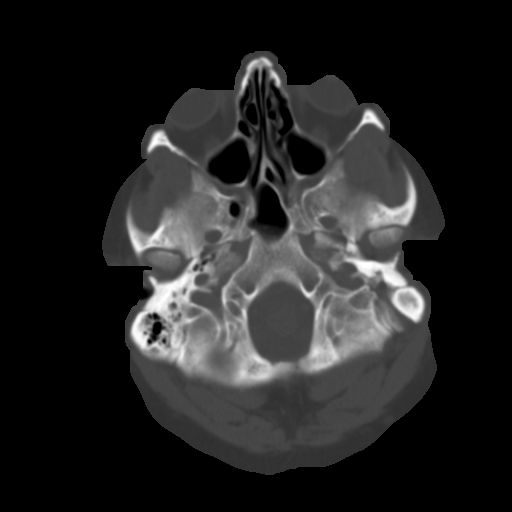
[im 4/28  brain]
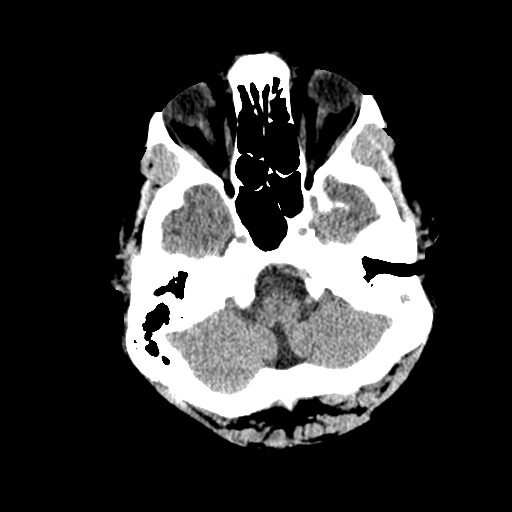
[im 6/28  brain]
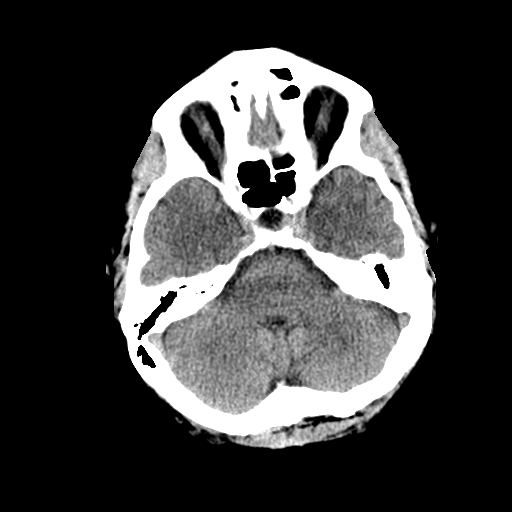
[im 8/28  brain]
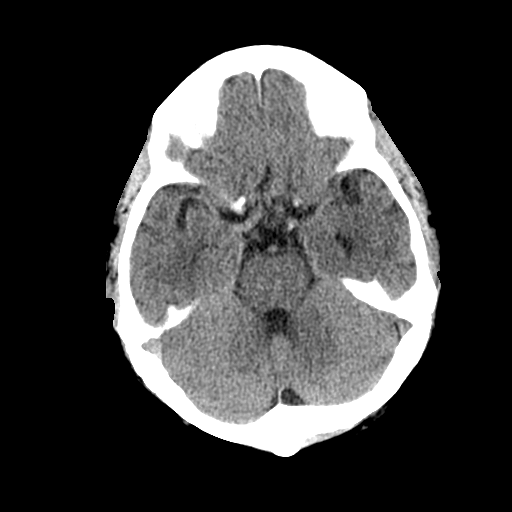
[im 10/28  brain]
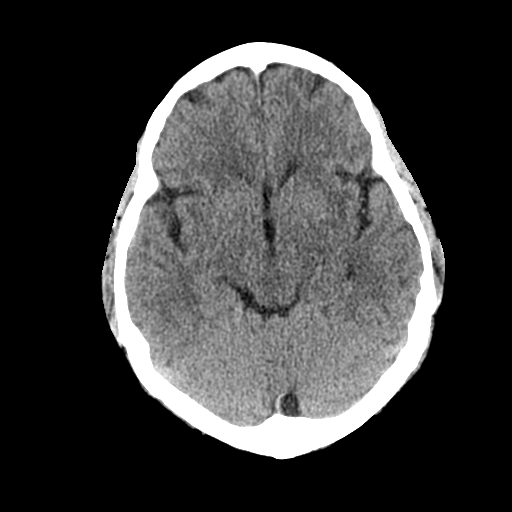
[im 10/28  bone]
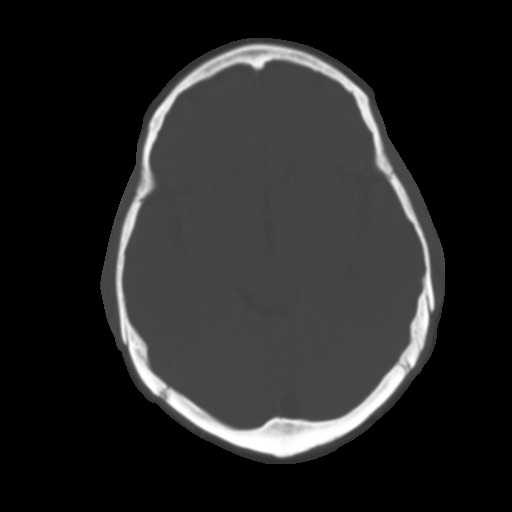
[im 12/28  brain]
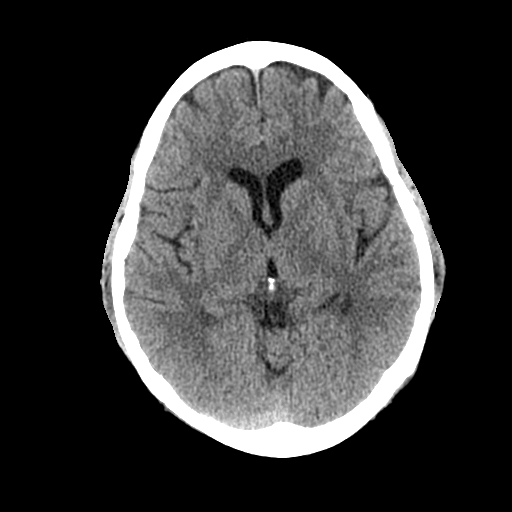
[im 14/28  brain]
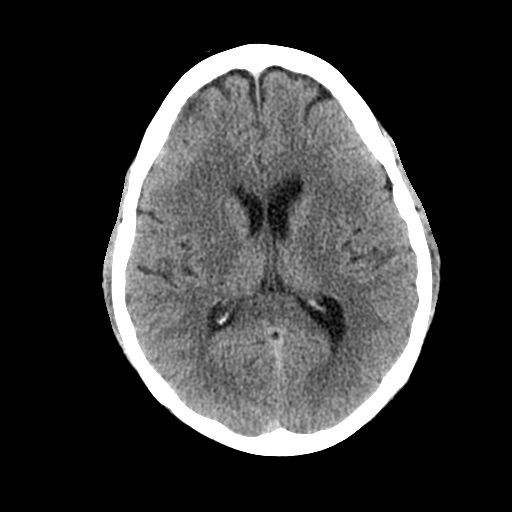
[im 16/28  brain]
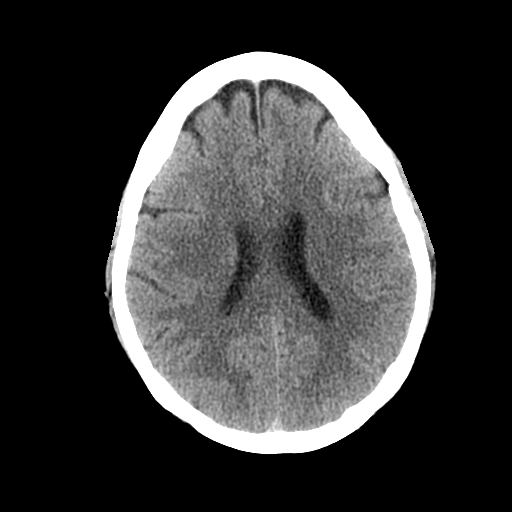
[im 18/28  brain]
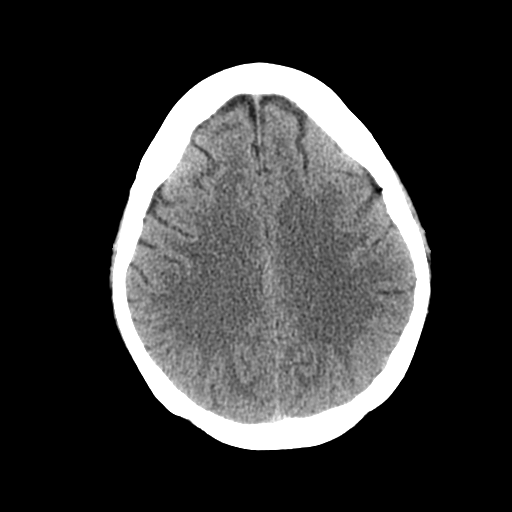
[im 18/28  bone]
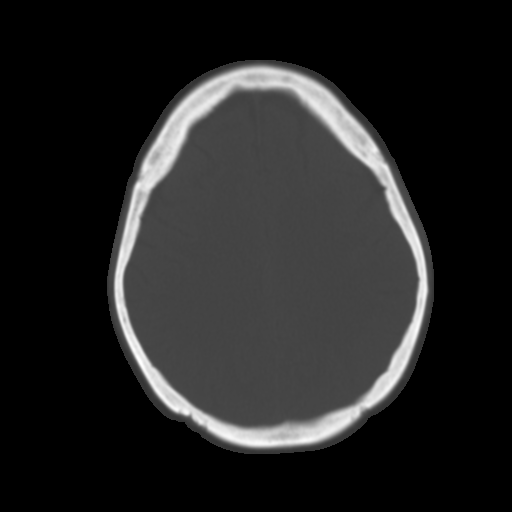
[im 20/28  brain]
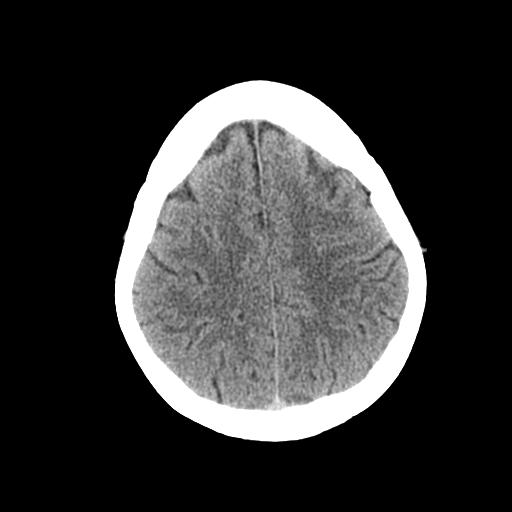
[im 22/28  brain]
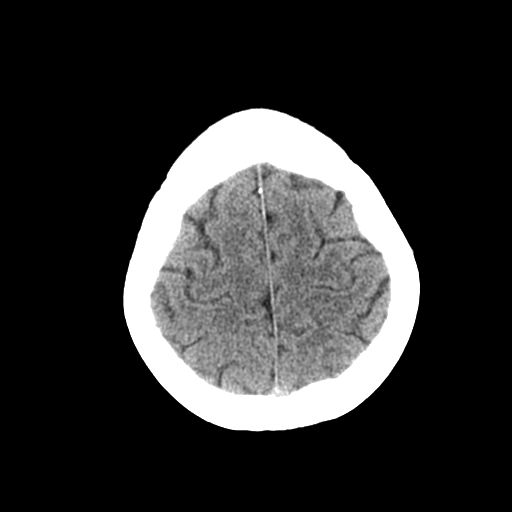
[im 24/28  brain]
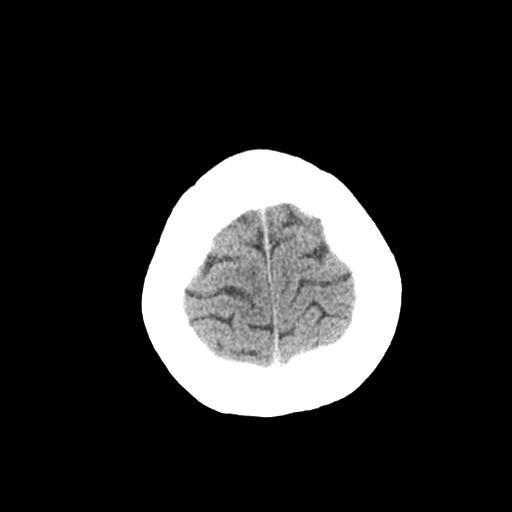
[im 26/28  brain]
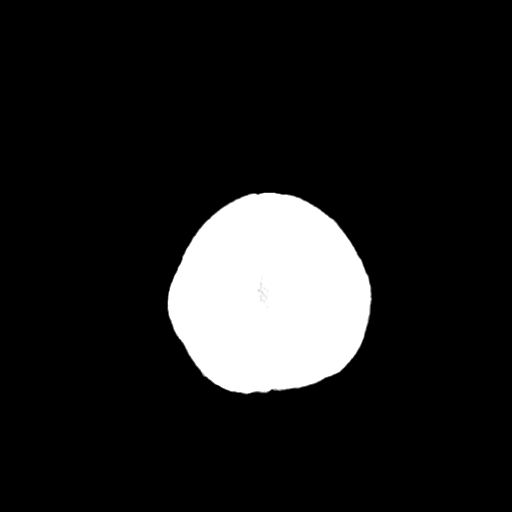
[im 26/28  bone]
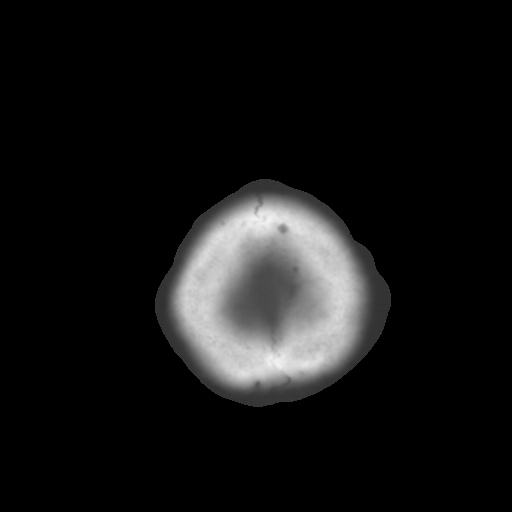

[Series 3: bone windows · axial · 0.45mm/px · z∈[+1483,+1503]mm · 2 of 28 slices shown]
[im 2/28  bone]
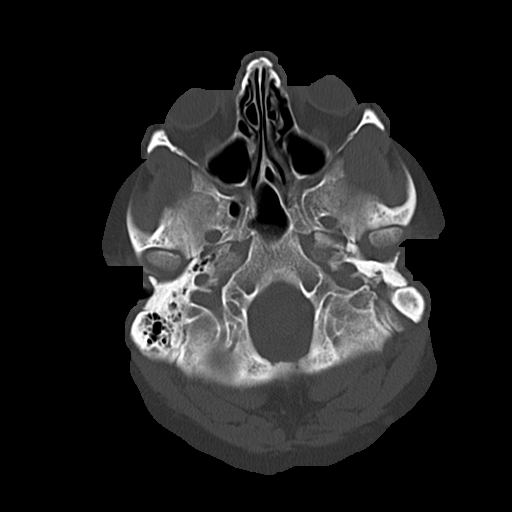
[im 6/28  bone]
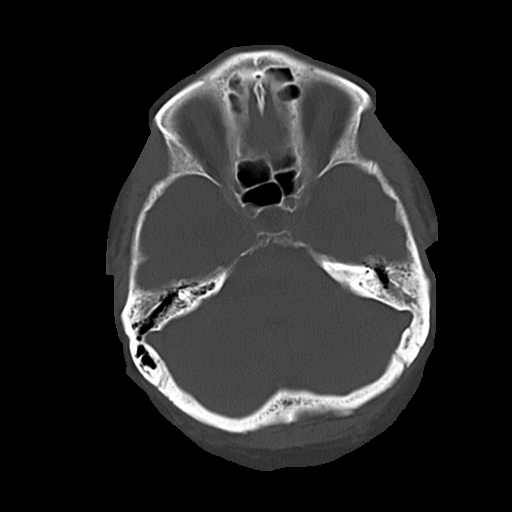

[15 of 30 positions shown; findings below may reference images not displayed]

FINDINGS: No intracranial hemorrhage.

No CT evidence of large acute infarct.

No hydrocephalus.

No intracranial mass lesion noted on this unenhanced exam.

Opacification left mastoid air cells and portion of the left middle
ear cavity. No obvious obstructing lesion of the eustachian tube
although the posterior superior nasopharynx was not imaged on the
current exam.

Minimal partial opacification left sphenoid sinus air cell.

Mild exophthalmos.
IMPRESSION: No intracranial hemorrhage.

No CT evidence of large acute infarct.

Opacification left mastoid air cell and portion left middle ear
cavity.

Please see above.

## 2016-08-16 IMAGING — CT CT HEAD W/O CM
1 series · 16 of 30 positions shown, 20 images · non-contrast
Comparison: Recent head CT 11/25/2014

CLINICAL DATA: 58-year-old female with syncope

EXAM:
CT HEAD WITHOUT CONTRAST
TECHNIQUE: Contiguous axial images were obtained from the base of the skull
through the vertex without intravenous contrast.

[Series 2: headseq 4.8 h45s · axial · 0.43mm/px · z∈[+1117,+1272]mm · 16 of 36 slices shown, 20 images]
[im 2/36  brain]
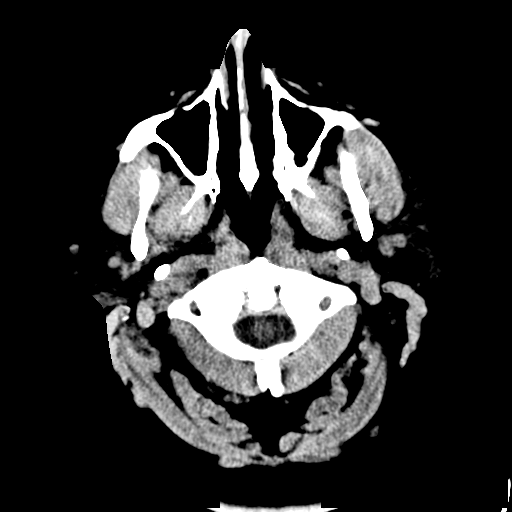
[im 2/36  bone]
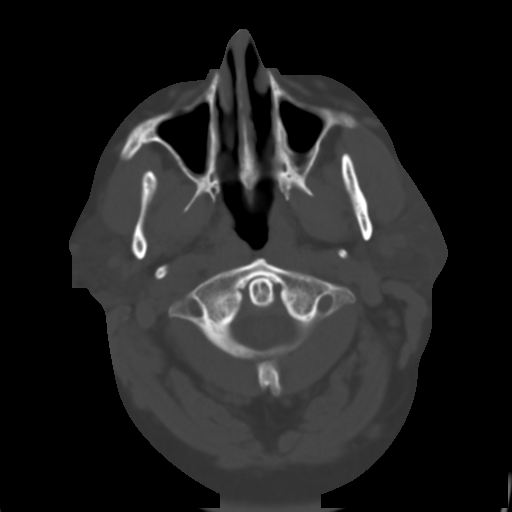
[im 4/36  brain]
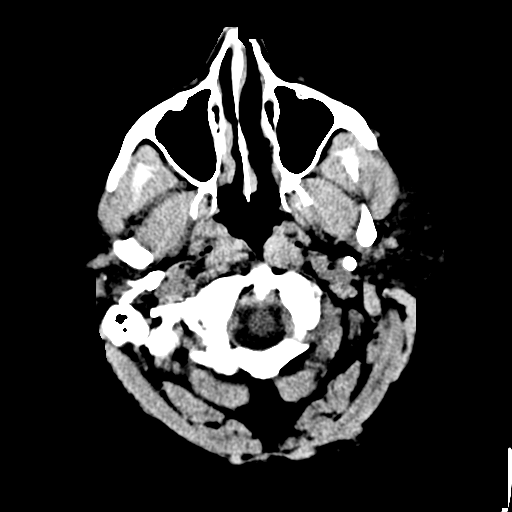
[im 7/36  brain]
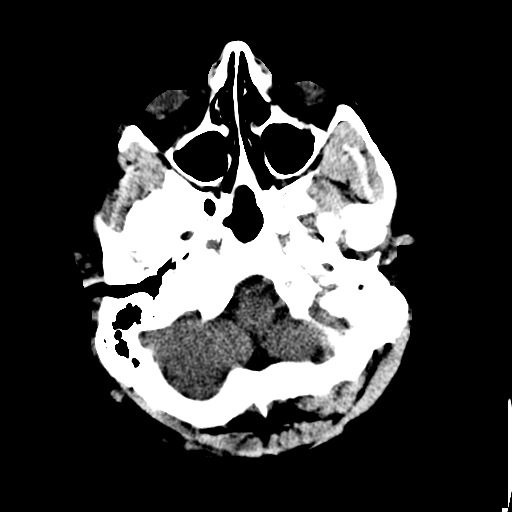
[im 9/36  brain]
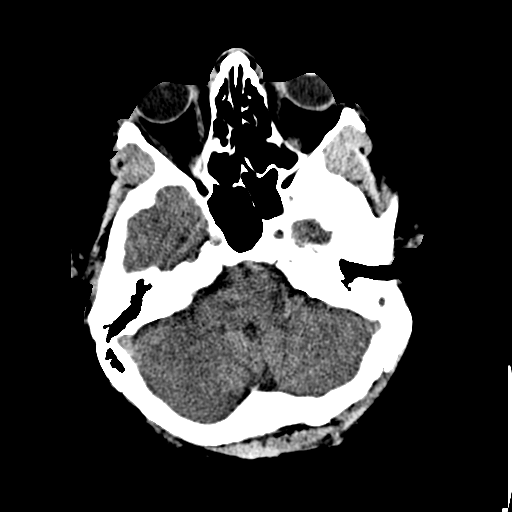
[im 10/36  brain]
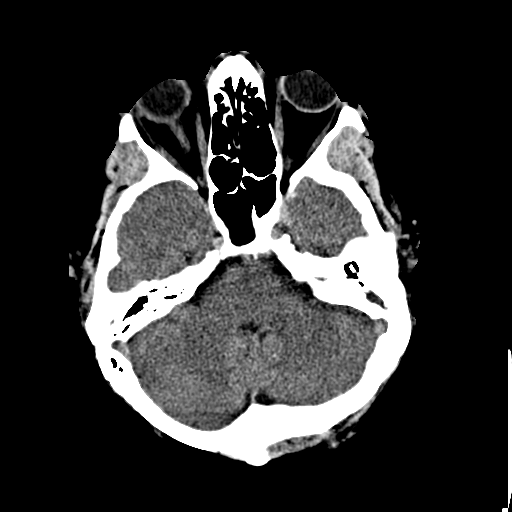
[im 10/36  bone]
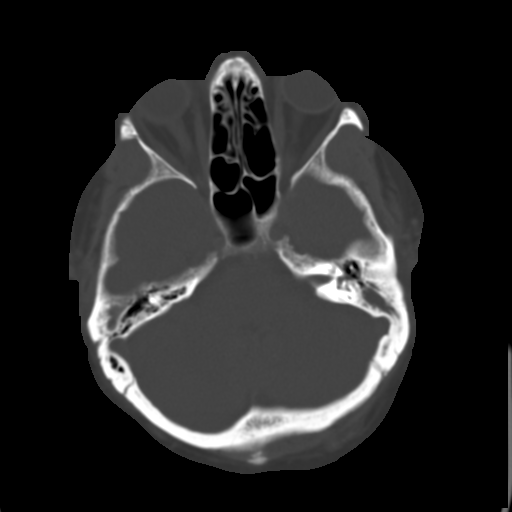
[im 13/36  brain]
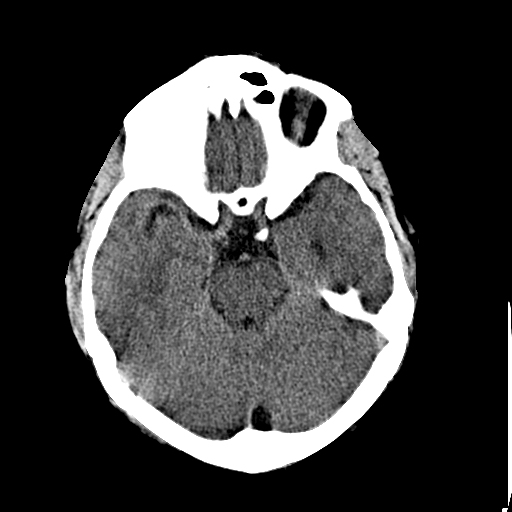
[im 15/36  brain]
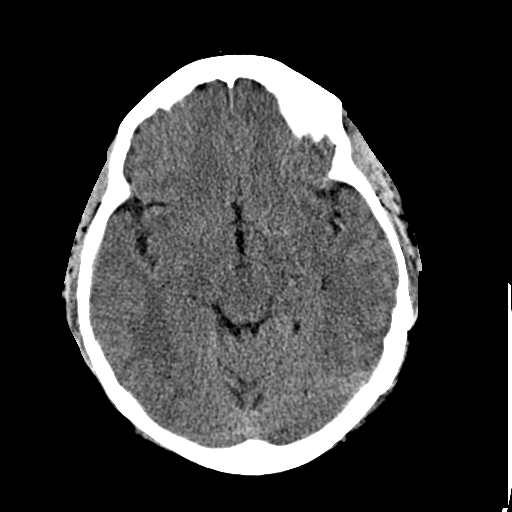
[im 17/36  brain]
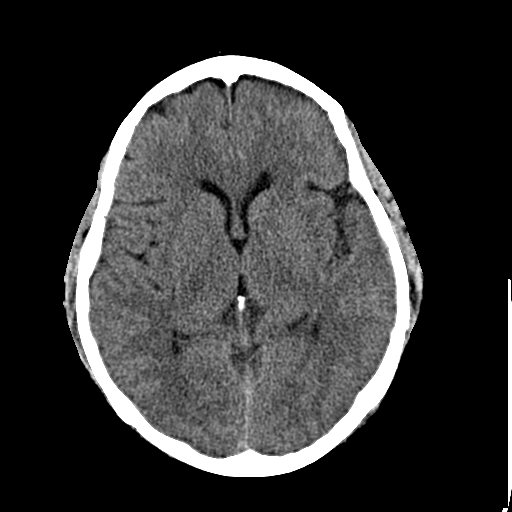
[im 19/36  brain]
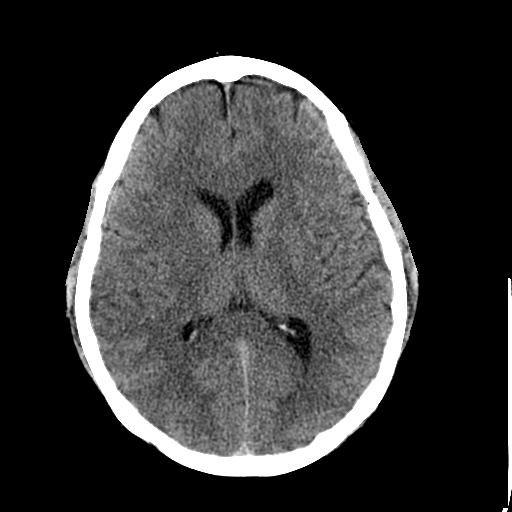
[im 19/36  bone]
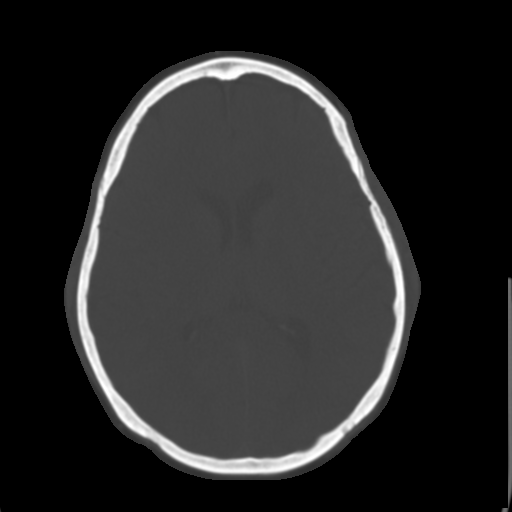
[im 21/36  brain]
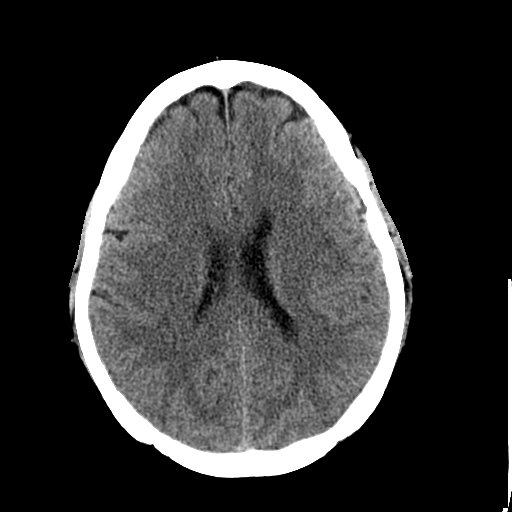
[im 23/36  brain]
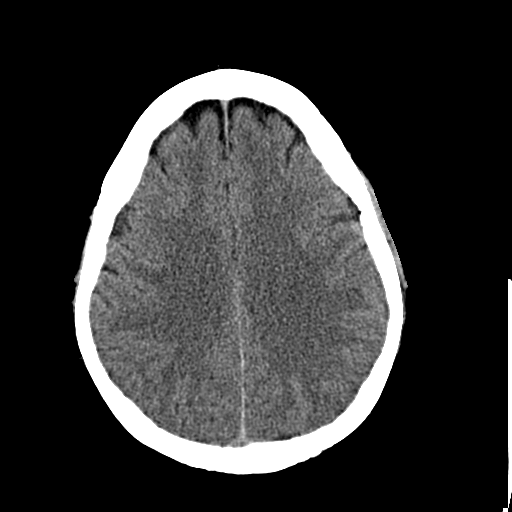
[im 26/36  brain]
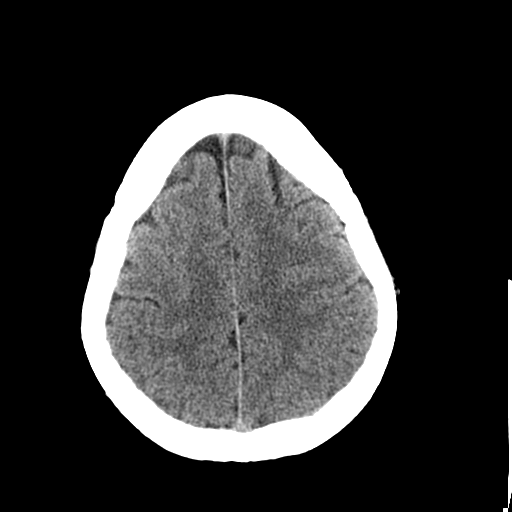
[im 27/36  brain]
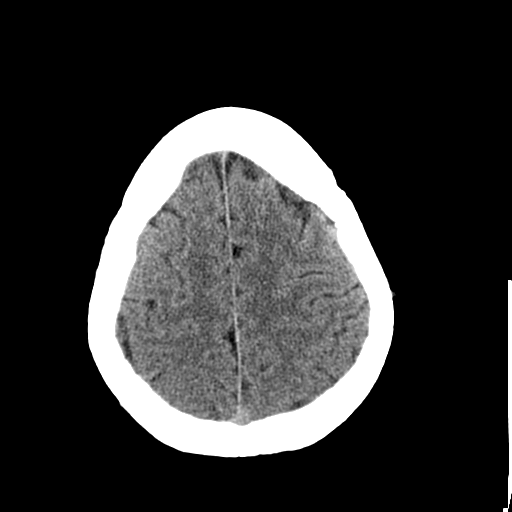
[im 27/36  bone]
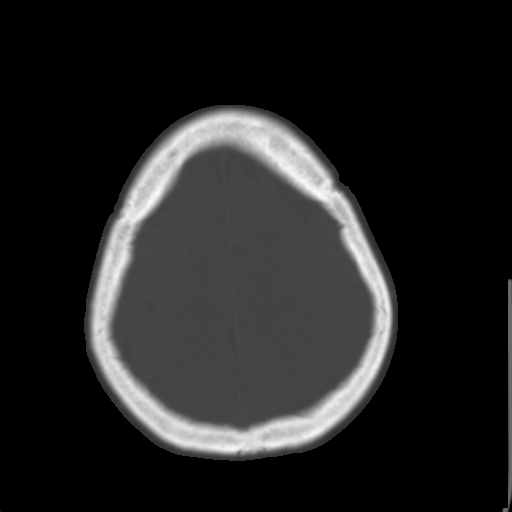
[im 29/36  brain]
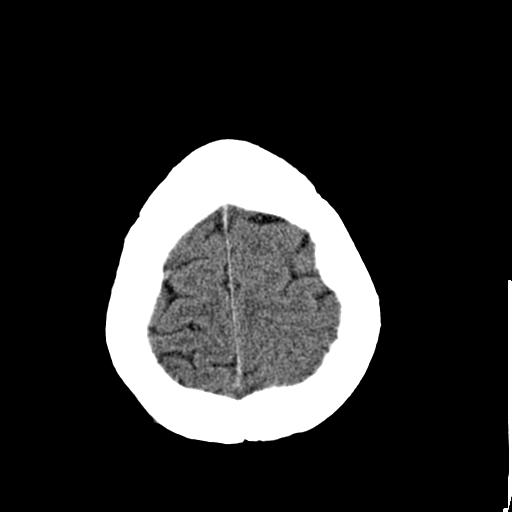
[im 32/36  brain]
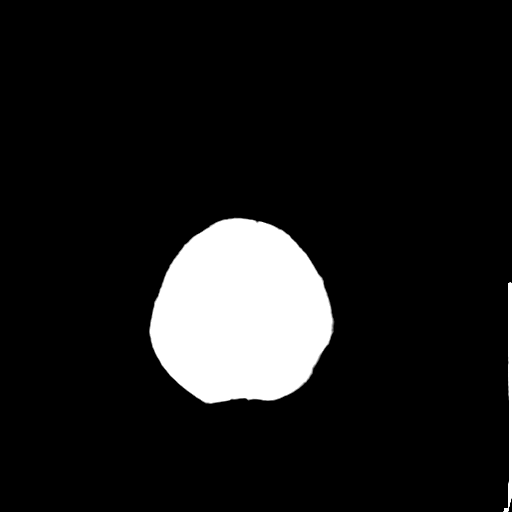
[im 34/36  brain]
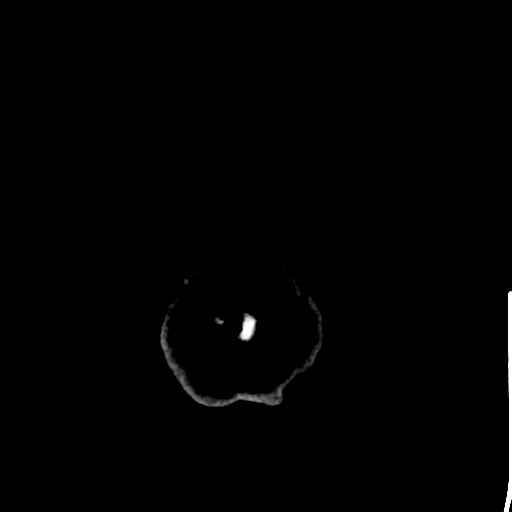

[16 of 30 positions shown; findings below may reference images not displayed]

FINDINGS: Negative for acute intracranial hemorrhage, acute infarction, mass,
mass effect, hydrocephalus or midline shift. Gray-white
differentiation is preserved throughout. No focal soft tissue or
calvarial abnormality. Globes and orbits are unremarkable. Stable
appearance of chronic left mastoid effusion.
IMPRESSION: 1. No acute intracranial abnormality.
2. Stable chronic left mastoid effusion.

## 2016-08-16 NOTE — Telephone Encounter (Signed)
Please fax letter to [phone].  I have put the letter in the nurse box.

## 2016-08-16 NOTE — Telephone Encounter (Signed)
The form from [facility] is in my box.  I am placing it with Rachel Kim, in her box.

## 2016-08-31 ENCOUNTER — Telehealth: Payer: Self-pay

## 2016-08-31 NOTE — Telephone Encounter (Signed)
[facility] [employer] program paperwork completed and sent from our office, notice today pt. Needs to send in proof of income. L/m for pt to call [facility] to provide info.to them [phone]

## 2016-09-07 NOTE — Telephone Encounter (Signed)
Fax [phone]   Case #   Needs new letter from sarah to [facility] stating no insurance for pt as well. refaxed today

## 2016-09-15 ENCOUNTER — Other Ambulatory Visit: Payer: Self-pay | Admitting: Physician Assistant

## 2016-09-15 DIAGNOSIS — I10 Essential (primary) hypertension: Secondary | ICD-10-CM

## 2016-09-20 NOTE — Telephone Encounter (Signed)
[facility] trulicity came in today 5 boxes, placed at [address] back fridge and pt called to pick up

## 2016-09-26 ENCOUNTER — Other Ambulatory Visit: Payer: Self-pay

## 2016-09-26 DIAGNOSIS — I10 Essential (primary) hypertension: Secondary | ICD-10-CM

## 2016-09-26 NOTE — Telephone Encounter (Signed)
Pharmacy asks for 90 day supply of indapamide 1.25mg  1 qd

## 2016-09-27 MED ORDER — INDAPAMIDE 1.25 MG PO TABS: 1.2500 mg | ORAL_TABLET | Freq: Every day | ORAL | 0 refills | Status: DC

## 2016-10-07 ENCOUNTER — Encounter: Payer: Self-pay | Admitting: Urgent Care

## 2016-11-08 ENCOUNTER — Emergency Department (HOSPITAL_COMMUNITY)
Admission: EM | Admit: 2016-11-08 | Discharge: 2016-11-08 | Disposition: A | Discharge status: Home / Self Care | Payer: 59 | Attending: Emergency Medicine | Admitting: Emergency Medicine

## 2016-11-08 ENCOUNTER — Encounter (HOSPITAL_COMMUNITY): Payer: Self-pay | Admitting: Emergency Medicine

## 2016-11-08 ENCOUNTER — Emergency Department (HOSPITAL_COMMUNITY): Payer: 59

## 2016-11-08 DIAGNOSIS — I129 Hypertensive chronic kidney disease with stage 1 through stage 4 chronic kidney disease, or unspecified chronic kidney disease: Secondary | ICD-10-CM | POA: Insufficient documentation

## 2016-11-08 DIAGNOSIS — N183 Chronic kidney disease, stage 3 (moderate): Secondary | ICD-10-CM | POA: Insufficient documentation

## 2016-11-08 DIAGNOSIS — E1122 Type 2 diabetes mellitus with diabetic chronic kidney disease: Secondary | ICD-10-CM | POA: Insufficient documentation

## 2016-11-08 DIAGNOSIS — Z7982 Long term (current) use of aspirin: Secondary | ICD-10-CM | POA: Insufficient documentation

## 2016-11-08 DIAGNOSIS — E039 Hypothyroidism, unspecified: Secondary | ICD-10-CM | POA: Insufficient documentation

## 2016-11-08 DIAGNOSIS — Z794 Long term (current) use of insulin: Secondary | ICD-10-CM | POA: Insufficient documentation

## 2016-11-08 DIAGNOSIS — Z79899 Other long term (current) drug therapy: Secondary | ICD-10-CM | POA: Insufficient documentation

## 2016-11-08 DIAGNOSIS — M25511 Pain in right shoulder: Secondary | ICD-10-CM

## 2016-11-08 MED ORDER — MELOXICAM 7.5 MG PO TABS: 15.0000 mg | ORAL_TABLET | Freq: Every day | ORAL | 0 refills | Status: DC

## 2016-11-08 MED ORDER — PREDNISONE 20 MG PO TABS: ORAL_TABLET | ORAL | 0 refills | Status: DC

## 2016-11-08 MED ORDER — PREDNISONE 20 MG PO TABS
60.0000 mg | ORAL_TABLET | Freq: Once | ORAL | Status: AC
  Administered 2016-11-08: 60 mg via ORAL
  Filled 2016-11-08: qty 3

## 2016-11-08 MED ORDER — MELOXICAM 15 MG PO TABS
15.0000 mg | ORAL_TABLET | Freq: Once | ORAL | Status: DC
  Filled 2016-11-08: qty 1

## 2016-11-08 NOTE — ED Provider Notes (Signed)
[facility] DEPT Provider Note    By signing my name below, I, Rachel Kim, attest that this documentation has been prepared under the direction and in the presence of Rachel Carnes, PA-C. Electronically Signed: Bea Kim, ED Scribe. 11/08/16. 11:29 AM.    History   Chief Complaint Chief Complaint  Patient presents with  . Arm Pain  . Shoulder Pain   The history is provided by the patient and medical records. No language interpreter was used.    Rachel Kim is an obese 61 y.o. female with PMHx of CKD, anxiety and depression, DM, HLD, HTN, hyperthyroidism, LVH, RBBB and sarcoidosis of the lung who presents to the Emergency Department complaining of sudden onset right shoulder pain that began approximately two weeks ago. She reports associated "crunching" of the right shoulder with movement. She has taken Tylenol and Aleve for pain with no significant relief. She states she has not been able to sleep in several days due to pain. Moving the RUE increases the pain. She denies trauma, injury or fall. She denies fever, chills, nausea, vomiting, numbness, tingling or weakness of the RUE, bruising or wounds. She is right hand dominant. She states she has been taking her daily medications as directed.  No chest pain or SOB.  Past Medical History:  Diagnosis Date  . Acute on chronic respiratory failure with hypoxia ([place]) 11/27/2014  . Anxiety   . Chronic kidney disease (CKD), stage III (moderate)   . CSA (central sleep apnea)   . Depression   . Diabetes mellitus   . GERD (gastroesophageal reflux disease)   . H/O breast biopsy 02/10/10   calcification , no tumors  . High cholesterol   . Hyperlipidemia   . Hypertension   . Hyperthyroidism    diagnosed 1989  . LVH (left ventricular hypertrophy)    echo 02/25/10 EF >55%  . OSA (obstructive sleep apnea)    severe complex apnea  . Pneumonia 04/04  . RBBB   . Sarcoidosis of lung ([place])   . Sleep apnea    uses CPAP  . Thyroid  disease    hypothyroidism    Patient Active Problem List   Diagnosis Date Noted  . Overactive bladder 01/28/2015  . Nocturia 01/28/2015  . Headache 11/27/2014  . Accelerated hypertension 11/27/2014  . CKD (chronic kidney disease) stage 3, GFR 30-59 ml/min 11/27/2014  . Dermatitis seborrheica 12/26/2012  . OSA on CPAP 12/19/2012  . Acne 07/04/2011  . Personal history of diseases of skin or subcutaneous tissue 07/04/2011  . Neurodermatitis 07/04/2011  . Hyperpigmentation of skin, postinflammatory 07/04/2011  . 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 Hypothyroidism 09/28/2006  . Controlled diabetes mellitus type 2 with complications ([place]) 78/93/8101  . Mixed hyperlipidemia 09/28/2006  . Morbid obesity ([place]) 09/28/2006    Past Surgical History:  Procedure Laterality Date  . ABDOMINAL HYSTERECTOMY  12/07  . APPENDECTOMY    . CARPAL TUNNEL RELEASE     right and left  . CHOLECYSTECTOMY    . ELBOW FRACTURE SURGERY     with pins and pins removed  . laminectomy c5-c6    . laminectomy l5, s1    . RETROPERITONEAL LYMPH NODE EXCISION     right underarm  . RHINOPLASTY     has had several revisions  . rt knee arthroscopy    . TONSILLECTOMY    . tubinates trimmed      OB History    Gravida Para Term Preterm AB Living   0 0 0 0  0 0   SAB TAB Ectopic Multiple Live Births   0 0 0 0         Home Medications    Prior to Admission medications   Medication Sig Start Date End Date Taking? Authorizing Provider  acetaminophen (TYLENOL) 500 MG tablet Take 500 mg by mouth every 6 (six) hours as needed.    Historical Provider, MD  aspirin 81 MG tablet Take 1 tablet (81 mg total) by mouth daily. 11/28/14   Robbie Lis, MD  benazepril (LOTENSIN) 20 MG tablet Take 1 tablet (20 mg total) by mouth daily. 04/18/16   Jaynee Eagles, PA-C  Cholecalciferol (VITAMIN D) 2000 UNITS CAPS Take by mouth daily.      Historical Provider, MD  Dulaglutide (TRULICITY) 1.5 JX/9.1YN SOPN Inject 1.5 mg into the skin once  a week. 06/07/16   Mancel Bale, PA-C  escitalopram (LEXAPRO) 20 MG tablet Take 1.5 tablets (30 mg total) by mouth daily. 04/18/16   Jaynee Eagles, PA-C  hydrALAZINE (APRESOLINE) 50 MG tablet Take 1.5 tablets (75 mg total) by mouth 3 (three) times daily. 05/25/16   Mihai Croitoru, MD  hydrOXYzine (ATARAX/VISTARIL) 50 MG tablet Take 1-1.5 tablets (50-75 mg total) by mouth at bedtime as needed. 04/18/16   Jaynee Eagles, PA-C  indapamide (LOZOL) 1.25 MG tablet Take 1 tablet (1.25 mg total) by mouth daily. 09/27/16   Mancel Bale, PA-C  insulin aspart (NOVOLOG) 100 UNIT/ML FlexPen Sliding scale   CBG 151 - 200: 2 units     CBG 201 - 250: 3 units     CBG 251 - 300: 5 units     CBG 301 - 350: 7 units     CBG 351 - 400 9 units 01/26/15   Mancel Bale, PA-C  Insulin Glargine (TOUJEO SOLOSTAR) 300 UNIT/ML SOPN Inject 22-100 Units into the skin daily. 01/26/15   Mancel Bale, PA-C  Insulin Pen Needle (BD PEN NEEDLE NANO U/F) 32G X 4 MM MISC 1 application by Does not apply route daily. 03/13/15   Darlyne Russian, MD  labetalol (NORMODYNE) 100 MG tablet Take 1 tablet (100 mg total) by mouth 2 (two) times daily. 04/18/16   Jaynee Eagles, PA-C  levothyroxine (SYNTHROID, LEVOTHROID) 125 MCG tablet Take 1 tablet (125 mcg total) by mouth daily before breakfast. 04/20/16   Jaynee Eagles, PA-C  Magnesium 250 MG TABS Take 1 tablet by mouth 2 (two) times daily.     Historical Provider, MD  Multiple Vitamins-Minerals (PRESERVISION AREDS 2 PO) Take 1 tablet by mouth 2 (two) times daily.    Historical Provider, MD  MYRBETRIQ 50 MG TB24 tablet TAKE 1 TABLET BY MOUTH DAILY 03/12/16   Mancel Bale, PA-C  pravastatin (PRAVACHOL) 80 MG tablet Take 1 tablet (80 mg total) by mouth every morning. 05/25/16   Sanda Klein, MD    Family History Family History  Problem Relation Age of Onset  . Colitis Father   . Thyroid disease Father   . Colon cancer Maternal Grandmother   . Hypertension Mother   . Stroke Mother   . Thyroid disease Mother    . Thyroid disease Sister   . Thyroid disease Brother   . Thyroid disease Sister   . Thyroid disease Brother     Social History Social History  Substance Use Topics  . Smoking status: Never Smoker  . Smokeless tobacco: Never Used  . Alcohol use No     Allergies   Doxazosin; Labetalol; Actos [pioglitazone  hydrochloride]; Amlodipine; Cozaar [losartan potassium]; Glipizide; Imdur [isosorbide dinitrate]; and Clindamycin hcl   Review of Systems Review of Systems  Constitutional: Negative for chills and fever.  Musculoskeletal: Positive for arthralgias and myalgias.  Skin: Negative for color change and wound.  Neurological: Negative for weakness and numbness.     Physical Exam Updated Vital Signs BP (!) 208/95 (BP Location: Left Arm)   Pulse 88   Temp 98.5 F (36.9 C) (Oral)   Resp 19   LMP 07/01/2006   SpO2 98%   Physical Exam  Constitutional: She is oriented to person, place, and time. She appears well-developed and well-nourished.  HENT:  Head: Normocephalic and atraumatic.  Mouth/Throat: Oropharynx is clear and moist.  Eyes: Conjunctivae and EOM are normal. Pupils are equal, round, and reactive to light.  Neck: Normal range of motion.  Cardiovascular: Normal rate, regular rhythm and normal heart sounds.   Pulmonary/Chest: Effort normal and breath sounds normal.  Abdominal: Soft. Bowel sounds are normal.  Musculoskeletal: Normal range of motion.  Full range of motion of the right shoulder maintained with some pain, mostly with full extension above the head, there is no apparent crepitus, normal grip strength, no wrist drop; normal sensation throughout right arm, normal cap refill, strong radial pulse  Neurological: She is alert and oriented to person, place, and time.  Skin: Skin is warm and dry.  Psychiatric: She has a normal mood and affect.  Nursing note and vitals reviewed.    ED Treatments / Results  DIAGNOSTIC STUDIES: Oxygen Saturation is 98% on RA,  normal by my interpretation.   COORDINATION OF CARE: 11:13 AM- Will prescribe Meloxicam. Will refer to orthopedist for further problems. Pt verbalizes understanding and agrees to plan.  Medications  predniSONE (DELTASONE) tablet 60 mg (60 mg Oral Given 11/08/16 1234)    Labs (all labs ordered are listed, but only abnormal results are displayed) Labs Reviewed - No data to display  EKG  EKG Interpretation None       Radiology Dg Shoulder Right  Result Date: 11/08/2016 CLINICAL DATA:  Increasing right shoulder pain for the past 2 weeks. The pain is located anteriorly and radiates to the infra scapular region. No known trauma. EXAM: RIGHT SHOULDER - 2+ VIEW COMPARISON:  Chest x-ray dated April 26, 2013 which included portions of the right shoulder. FINDINGS: The bones are subjectively adequately mineralized. There is narrowing of the glenohumeral joint space. There is a small spur arising from the inferior articular margin of the glenoid. The humeral head and greater tuberosity appear normal. The subacromial subdeltoid space is normal. There is mild narrowing of the AC joint with marginal spurring. The observed portions of the right clavicle and upper right ribs are normal. IMPRESSION: There is mild degenerative change of the glenohumeral and AC joints. There is no acute bony abnormality. Electronically Signed   By: David  Martinique M.D.   On: 11/08/2016 11:01    Procedures Procedures (including critical care time)  Medications Ordered in ED Medications  predniSONE (DELTASONE) tablet 60 mg (60 mg Oral Given 11/08/16 1234)     Initial Impression / Assessment and Plan / ED Course  I have reviewed the triage vital signs and the nursing notes.  Pertinent labs & imaging results that were available during my care of the patient were reviewed by me and considered in my medical decision making (see chart for details).  61 year old female here with right shoulder pain for past 2 weeks.  Reports some crunching when moving her  arm. Denies any recent injury, trauma, or falls. Exam is overall atraumatic. She maintains full range of motion of the shoulder, some pain with extension above the head. There is no apparent crepitus on my exam. Her arm is neurovascularly intact with normal grip strength. No wrist drop or other focal neurologic findings.  X-ray obtained revealing degenerative changes of the glenohumeral and AC joints. This is likely source of her pain.  She has no chest pain or SOB to suggest atypical ACS.  BP elevated on arrival, but on re-check much improved.  She has been compliant with her home medications.  Will start on prednisone given her CKD as should probably avoid large doses of NSAIDs.  Recommended close follow-up with PCP.  Also given referral to orthopedics if needed.  Discussed plan with patient, she acknowledged understanding and agreed with plan of care.  Return precautions given for new or worsening symptoms.  Final Clinical Impressions(s) / ED Diagnoses   Final diagnoses:  Acute pain of right shoulder    New Prescriptions Discharge Medication List as of 11/08/2016 11:35 AM    START taking these medications   Details  meloxicam (MOBIC) 7.5 MG tablet Take 2 tablets (15 mg total) by mouth daily., Starting Tue 11/08/2016, Print       I personally performed the services described in this documentation, which was scribed in my presence. The recorded information has been reviewed and is accurate.     Larene Pickett, PA-C 11/08/16 [place], MD 11/08/16 [phone]

## 2016-11-08 NOTE — ED Triage Notes (Signed)
patient c/o right arm and right shoulder pain x 2 weeks that is worse with movement. Patient denies any injury that could have caused pain.

## 2016-11-08 NOTE — ED Notes (Signed)
Bed: WTR8 Expected date:  Expected time:  Means of arrival:  Comments: 

## 2016-11-08 NOTE — Discharge Instructions (Signed)
Take the prescribed medication as directed.  Can use heat and/or ice therapy as well. Follow-up with your primary care doctor.  You may also follow-up with orthopedics if you continue having ongoing issues. Return to the ED for new or worsening symptoms.

## 2016-11-19 ENCOUNTER — Other Ambulatory Visit: Payer: Self-pay | Admitting: Urgent Care

## 2016-11-25 ENCOUNTER — Ambulatory Visit (INDEPENDENT_AMBULATORY_CARE_PROVIDER_SITE_OTHER): Payer: Self-pay | Admitting: Family Medicine

## 2016-11-25 ENCOUNTER — Encounter: Payer: Self-pay | Admitting: Family Medicine

## 2016-11-25 VITALS — BP 136/64 | HR 86 | Temp 98.1°F | Resp 16 | Ht 64.0 in | Wt 272.0 lb

## 2016-11-25 DIAGNOSIS — N183 Chronic kidney disease, stage 3 unspecified: Secondary | ICD-10-CM

## 2016-11-25 DIAGNOSIS — F331 Major depressive disorder, recurrent, moderate: Secondary | ICD-10-CM

## 2016-11-25 DIAGNOSIS — N3281 Overactive bladder: Secondary | ICD-10-CM

## 2016-11-25 DIAGNOSIS — E1122 Type 2 diabetes mellitus with diabetic chronic kidney disease: Secondary | ICD-10-CM

## 2016-11-25 DIAGNOSIS — Z794 Long term (current) use of insulin: Secondary | ICD-10-CM

## 2016-11-25 DIAGNOSIS — M19011 Primary osteoarthritis, right shoulder: Secondary | ICD-10-CM

## 2016-11-25 LAB — POCT GLYCOSYLATED HEMOGLOBIN (HGB A1C): Hemoglobin A1C: 7.5

## 2016-11-25 MED ORDER — ESCITALOPRAM OXALATE 20 MG PO TABS: 30.0000 mg | ORAL_TABLET | Freq: Every day | ORAL | 5 refills | Status: DC

## 2016-11-25 MED ORDER — DULAGLUTIDE 1.5 MG/0.5ML ~~LOC~~ SOAJ: 1.5000 mg | SUBCUTANEOUS | 3 refills | Status: DC

## 2016-11-25 MED ORDER — MIRABEGRON ER 50 MG PO TB24: 50.0000 mg | ORAL_TABLET | Freq: Every day | ORAL | 1 refills | Status: DC

## 2016-11-25 NOTE — Patient Instructions (Addendum)
  [facility] Address: [address], [place], [place] [address]  Phone: [phone]  [facility]  Physical Therapist  [place], [place]  [phone]    [facility], [facility]  Physical [place], [place]  [phone]     IF you received an x-ray today, you will receive an invoice from [facility]. Please contact [facility] at [phone] with questions or concerns regarding your invoice.   IF you received labwork today, you will receive an invoice from [facility]. Please contact [facility] at [phone] with questions or concerns regarding your invoice.   Our billing staff will not be able to assist you with questions regarding bills from these companies.  You will be contacted with the lab results as soon as they are available. The fastest way to get your results is to activate your My Chart account. Instructions are located on the last page of this paperwork. If you have not heard from [place] regarding the results in 2 weeks, please contact this office.    We recommend that you schedule a mammogram for breast cancer screening. Typically, you do not need a referral to do this. Please contact a local imaging center to schedule your mammogram.  [facility] - [phone]  *ask for the Radiology Department [facility] - [phone] or [phone]  [facility] - [phone] [place] [phone] [facility] - [phone]  *ask for the [facility] - [phone]  *ask for the Radiology Department [facility] - [phone]  *ask for the [facility] - [phone]

## 2016-11-25 NOTE — Progress Notes (Signed)
Chief Complaint  Patient presents with  . Medication Refill    for Right Shoulder pain  . Depression    positive in triage    HPI Major Depression Pt reports that she started Psychotherapy today for her depression She plans to meet once a week She denies active suicidal thoughts.  She describes her feelings as"wouldn't it be nice if I were dead". She reports that she would not commit suicide because "my kitty cats need me". Depression screen [facility] 2/9 11/25/2016 04/18/2016 01/30/2016 11/23/2015 07/08/2015  Decreased Interest 3 0 0 1 0  Down, Depressed, Hopeless 2 0 1 1 0  PHQ - 2 Score 5 0 1 2 0  Altered sleeping 2 - - - -  Tired, decreased energy 3 - - - -  Change in appetite 1 - - - -  Feeling bad or failure about yourself  3 - - - -  Trouble concentrating 1 - - - -  Moving slowly or fidgety/restless 1 - - - -  Suicidal thoughts 1 - - - -  PHQ-9 Score 17 - - - -  Difficult doing work/chores - - - - -   Right shoulder pain She completed a 5 days course of prednisone which worked well but as soon as she stopped it the pain came back.  She would rate her pain as a 5/10 She states that her pain was about 9/10 when she was seen at [facility] Lab Results  Component Value Date   CREATININE 1.54 (H) 11/25/2016    CKD Stage 3 She reports stable stage 3 kidney failure She does not take any medication currently  She does not take NSAIDs because of her history of CKD stage 3   Type 2 DM She sees endocrinology Her last a1c showed improvement Lab Results  Component Value Date   HGBA1C 7.5 11/25/2016   She lost her insurance so she has not had any labs recently Her medications were changed last visit and she is now on Trulicity She denies hypoglycemia She reports that she is sticking to an ADA diet  Overactive bladder  she is taking mybetriq for OAB Reports that it is helping her No dizziness  Past Medical History:  Diagnosis Date  . Acute on chronic respiratory failure  with hypoxia ([place]) 11/27/2014  . Anxiety   . Chronic kidney disease (CKD), stage III (moderate)   . CSA (central sleep apnea)   . Depression   . Diabetes mellitus   . GERD (gastroesophageal reflux disease)   . H/O breast biopsy 02/10/10   calcification , no tumors  . High cholesterol   . Hyperlipidemia   . Hypertension   . Hyperthyroidism    diagnosed 1989  . LVH (left ventricular hypertrophy)    echo 02/25/10 EF >55%  . OSA (obstructive sleep apnea)    severe complex apnea  . Pneumonia 04/04  . RBBB   . Sarcoidosis of lung ([place])   . Sleep apnea    uses CPAP  . Thyroid disease    hypothyroidism    Current Outpatient Prescriptions  Medication Sig Dispense Refill  . acetaminophen (TYLENOL) 500 MG tablet Take 500 mg by mouth every 6 (six) hours as needed.    Marland Kitchen aspirin 81 MG tablet Take 1 tablet (81 mg total) by mouth daily. 30 tablet 0  . benazepril (LOTENSIN) 20 MG tablet Take 1 tablet (20 mg total) by mouth daily. 90 tablet 3  . Cholecalciferol (VITAMIN D) 2000 UNITS CAPS  Take by mouth daily.      . Dulaglutide (TRULICITY) 1.5 TK/1.6WF SOPN Inject 1.5 mg into the skin once a week. 12 pen 3  . escitalopram (LEXAPRO) 20 MG tablet Take 1.5 tablets (30 mg total) by mouth daily. 45 tablet 5  . hydrALAZINE (APRESOLINE) 50 MG tablet Take 1.5 tablets (75 mg total) by mouth 3 (three) times daily. 405 tablet 3  . hydrOXYzine (ATARAX/VISTARIL) 50 MG tablet Take 1-1.5 tablets (50-75 mg total) by mouth at bedtime as needed. 60 tablet 2  . indapamide (LOZOL) 1.25 MG tablet Take 1 tablet (1.25 mg total) by mouth daily. 90 tablet 0  . labetalol (NORMODYNE) 100 MG tablet Take 1 tablet (100 mg total) by mouth 2 (two) times daily. 180 tablet 3  . levothyroxine (SYNTHROID, LEVOTHROID) 125 MCG tablet Take 1 tablet (125 mcg total) by mouth daily before breakfast. 90 tablet 3  . Magnesium 250 MG TABS Take 1 tablet by mouth 2 (two) times daily.     . mirabegron ER (MYRBETRIQ) 50 MG TB24 tablet Take 1  tablet (50 mg total) by mouth daily. 90 tablet 1  . Multiple Vitamins-Minerals (PRESERVISION AREDS 2 PO) Take 1 tablet by mouth 2 (two) times daily.    . pravastatin (PRAVACHOL) 80 MG tablet Take 1 tablet (80 mg total) by mouth every morning. 90 tablet 3  . Insulin Pen Needle (BD PEN NEEDLE NANO U/F) 32G X 4 MM MISC 1 application by Does not apply route daily. 100 each 3   No current facility-administered medications for this visit.     Allergies:  Allergies  Allergen Reactions  . Doxazosin Swelling  . Labetalol Swelling  . Actos [Pioglitazone Hydrochloride] Swelling  . Amlodipine Swelling  . Cozaar [Losartan Potassium] Other (See Comments)    Causes drowsiness/zombie like  . Glipizide Swelling  . Imdur [Isosorbide Dinitrate] Other (See Comments)    Causes severe headaches  . Clindamycin Hcl Rash    Past Surgical History:  Procedure Laterality Date  . ABDOMINAL HYSTERECTOMY  12/07  . APPENDECTOMY    . CARPAL TUNNEL RELEASE     right and left  . CHOLECYSTECTOMY    . ELBOW FRACTURE SURGERY     with pins and pins removed  . laminectomy c5-c6    . laminectomy l5, s1    . RETROPERITONEAL LYMPH NODE EXCISION     right underarm  . RHINOPLASTY     has had several revisions  . rt knee arthroscopy    . TONSILLECTOMY    . tubinates trimmed      Social History   Social History  . Marital status: Single    Spouse name: n/a  . Number of children: 0  . Years of education: Master's   Occupational History  . Quality Engineer    Social History Main Topics  . Smoking status: Never Smoker  . Smokeless tobacco: Never Used  . Alcohol use No  . Drug use: No  . Sexual activity: No     Comment: hysterectomy   Other Topics Concern  . None   Social History Narrative   Lives alone with 2 kitties.   Unemployed for a while. To start a new job 07/2015.          Review of Systems  Constitutional: Negative for chills, fever and weight loss.  HENT: Negative for ear pain,  hearing loss and tinnitus.   Eyes: Negative for blurred vision, double vision and photophobia.  Respiratory: Negative for cough, shortness of  breath and wheezing.   Cardiovascular: Negative for chest pain, palpitations and orthopnea.  Gastrointestinal: Negative for abdominal pain, nausea and vomiting.  Genitourinary: Negative for dysuria, frequency and urgency.  Musculoskeletal: Positive for joint pain. Negative for back pain, falls, myalgias and neck pain.  Neurological: Negative for dizziness, tingling and headaches.  Psychiatric/Behavioral: Positive for depression.       See hpi    Objective: Vitals:   11/25/16 1541 11/25/16 1551  BP: (!) 176/91 136/64  Pulse: 86   Resp: 16   Temp: 98.1 F (36.7 C)   TempSrc: Oral   SpO2: 94%   Weight: 272 lb (123.4 kg)   Height: 5\' 4"  (1.626 m)     Physical Exam  Constitutional: She is oriented to person, place, and time. She appears well-developed and well-nourished.  HENT:  Head: Normocephalic and atraumatic.  Right Ear: External ear normal.  Left Ear: External ear normal.  Eyes: Conjunctivae and EOM are normal.  Neck: Normal range of motion. Neck supple.  Cardiovascular: Normal rate, regular rhythm and normal heart sounds.   Pulmonary/Chest: Effort normal and breath sounds normal. No respiratory distress. She has no wheezes.  Musculoskeletal:  Right shoulder with reduced range of motion. SI joint tenderness on the right, no crepitus, no effusion Left shoulder within normal range  Neurological: She is alert and oriented to person, place, and time. She displays normal reflexes.  Skin: Skin is warm. Capillary refill takes less than 2 seconds.    IMPRESSION: There is mild degenerative change of the glenohumeral and AC joints. There is no acute bony abnormality.   Electronically Signed   By: David  Martinique M.D.   On: 11/08/2016 11:01  Assessment and Plan Alicja was seen today for medication refill and depression.  Diagnoses and  all orders for this visit:  Primary osteoarthritis of right shoulder- advised to avoid nsaid Recommended physical therapy  CKD (chronic kidney disease) stage 3, GFR 30-59 ml/min -     Lipid panel -     Comprehensive metabolic panel -     POCT glycosylated hemoglobin (Hb A1C)  Moderate episode of recurrent major depressive disorder (HCC) -     escitalopram (LEXAPRO) 20 MG tablet; Take 1.5 tablets (30 mg total) by mouth daily.  Type 2 diabetes mellitus with stage 3 chronic kidney disease, with long-term current use of insulin (HCC) -     Lipid panel -     Comprehensive metabolic panel -     POCT glycosylated hemoglobin (Hb A1C) -     Dulaglutide (TRULICITY) 1.5 EM/3.3KP SOPN; Inject 1.5 mg into the skin once a week.  Overactive bladder- tolerating well, refilled medication today -     mirabegron ER (MYRBETRIQ) 50 MG TB24 tablet; Take 1 tablet (50 mg total) by mouth daily.      [place]

## 2016-11-26 LAB — COMPREHENSIVE METABOLIC PANEL
ALT: 14 IU/L (ref 0–32)
AST: 13 IU/L (ref 0–40)
Albumin/Globulin Ratio: 1.7 (ref 1.2–2.2)
Albumin: 4.4 g/dL (ref 3.6–4.8)
Alkaline Phosphatase: 94 IU/L (ref 39–117)
BUN/Creatinine Ratio: 23 (ref 12–28)
BUN: 35 mg/dL — ABNORMAL HIGH (ref 8–27)
Bilirubin Total: 0.3 mg/dL (ref 0.0–1.2)
CALCIUM: 9.3 mg/dL (ref 8.7–10.3)
CO2: 25 mmol/L (ref 18–29)
CREATININE: 1.54 mg/dL — AB (ref 0.57–1.00)
Chloride: 98 mmol/L (ref 96–106)
GFR, EST AFRICAN AMERICAN: 42 mL/min/{1.73_m2} — AB (ref >59)
GFR, EST NON AFRICAN AMERICAN: 36 mL/min/{1.73_m2} — AB (ref >59)
GLOBULIN, TOTAL: 2.6 g/dL (ref 1.5–4.5)
Glucose: 114 mg/dL — ABNORMAL HIGH (ref 65–99)
Potassium: 3.9 mmol/L (ref 3.5–5.2)
SODIUM: 141 mmol/L (ref 134–144)
Total Protein: 7 g/dL (ref 6.0–8.5)

## 2016-11-26 LAB — LIPID PANEL
CHOLESTEROL TOTAL: 174 mg/dL (ref 100–199)
Chol/HDL Ratio: 4.7 ratio — ABNORMAL HIGH (ref 0.0–4.4)
HDL: 37 mg/dL — ABNORMAL LOW (ref >39)
LDL CALC: 65 mg/dL (ref 0–99)
Triglycerides: 362 mg/dL — ABNORMAL HIGH (ref 0–149)
VLDL Cholesterol Cal: 72 mg/dL — ABNORMAL HIGH (ref 5–40)

## 2016-11-29 ENCOUNTER — Encounter: Payer: Self-pay | Admitting: Family Medicine

## 2016-12-06 ENCOUNTER — Encounter: Payer: Self-pay | Admitting: Family Medicine

## 2016-12-08 ENCOUNTER — Telehealth: Payer: Self-pay | Admitting: *Deleted

## 2016-12-08 NOTE — Telephone Encounter (Signed)
Left message in voice-mail to pick up Toujeo it is in the refrigerator at [address].

## 2016-12-13 ENCOUNTER — Telehealth: Payer: Self-pay

## 2016-12-13 NOTE — Telephone Encounter (Signed)
Called and spoke with pt to know if she received Toujeo Pen. Pt states she did receive it.

## 2017-01-05 ENCOUNTER — Telehealth: Payer: Self-pay | Admitting: Family Medicine

## 2017-01-05 NOTE — Telephone Encounter (Signed)
Pt is requesting a med refill on her Myrbetriq and Trulicity.

## 2017-01-05 NOTE — Telephone Encounter (Signed)
Are these her lilly meds? Please print rx if so

## 2017-01-06 NOTE — Telephone Encounter (Signed)
Pt calling for a refill on Trulicity Pt picks this medicine up at the [address]  In refridg she also need a refill on her Myrbetriq

## 2017-01-09 NOTE — Telephone Encounter (Signed)
I feel like I just filled out her trulicity paperwork and we are waiting for it to come to the office - does she have special paperwork for her myrbetriq that I need to fill out?

## 2017-01-13 NOTE — Telephone Encounter (Signed)
Left message to return message 

## 2017-01-16 NOTE — Telephone Encounter (Signed)
2nd message left to return call.

## 2017-02-08 ENCOUNTER — Other Ambulatory Visit: Payer: Self-pay | Admitting: Emergency Medicine

## 2017-02-08 MED ORDER — DULAGLUTIDE 1.5 MG/0.5ML ~~LOC~~ SOAJ: 1.5000 mg | SUBCUTANEOUS | 3 refills | Status: DC

## 2017-02-22 DIAGNOSIS — Z0271 Encounter for disability determination: Secondary | ICD-10-CM

## 2017-03-02 ENCOUNTER — Telehealth: Payer: Self-pay

## 2017-03-02 NOTE — Telephone Encounter (Signed)
Rachel Kim,  Patient's Trulicity Prescription was sent to [facility]. Per patient, it is always sent here to the office. I am unsure of what to do to get prescription sent to office and not at [facility]. Please Advise. [facility] states patient does not have insurance and cash price is $2000

## 2017-03-03 MED ORDER — DULAGLUTIDE 1.5 MG/0.5ML ~~LOC~~ SOAJ: 1.5000 mg | SUBCUTANEOUS | 3 refills | Status: DC

## 2017-03-03 NOTE — Telephone Encounter (Signed)
This is typically sent to the drug company - see if the patient has the fax number or the chart can be researched with the correct fax number to the drug company - the hard copy has been printed

## 2017-03-07 NOTE — Telephone Encounter (Signed)
Prescription faxed to [facility]. Fax # [phone]

## 2017-03-16 ENCOUNTER — Telehealth: Payer: Self-pay

## 2017-03-24 ENCOUNTER — Other Ambulatory Visit: Payer: Self-pay | Admitting: Physician Assistant

## 2017-03-24 DIAGNOSIS — I10 Essential (primary) hypertension: Secondary | ICD-10-CM

## 2017-04-05 ENCOUNTER — Other Ambulatory Visit: Payer: Self-pay | Admitting: Physician Assistant

## 2017-04-05 ENCOUNTER — Encounter: Payer: Self-pay | Admitting: Physician Assistant

## 2017-04-05 DIAGNOSIS — I10 Essential (primary) hypertension: Secondary | ICD-10-CM

## 2017-04-06 ENCOUNTER — Telehealth: Payer: Self-pay | Admitting: *Deleted

## 2017-04-06 NOTE — Telephone Encounter (Signed)
Pt advised refill request for Benazepril 20 mg,#30, O refills. Levothyroxine 125 mcg #30, 0 refills faxed over to  [place], [place]. [phone]. Pt has appt scheduled for 04/06/17

## 2017-04-07 ENCOUNTER — Other Ambulatory Visit: Payer: Self-pay | Admitting: Physician Assistant

## 2017-04-07 DIAGNOSIS — I10 Essential (primary) hypertension: Secondary | ICD-10-CM

## 2017-04-11 MED ORDER — LEVOTHYROXINE SODIUM 125 MCG PO TABS: 125.0000 ug | ORAL_TABLET | Freq: Every day | ORAL | 3 refills | Status: DC

## 2017-04-11 MED ORDER — BENAZEPRIL HCL 20 MG PO TABS: 20.0000 mg | ORAL_TABLET | Freq: Every day | ORAL | 3 refills | Status: DC

## 2017-04-11 MED ORDER — LABETALOL HCL 100 MG PO TABS: 100.0000 mg | ORAL_TABLET | Freq: Two times a day (BID) | ORAL | 3 refills | Status: DC

## 2017-04-14 ENCOUNTER — Ambulatory Visit: Payer: Self-pay | Admitting: Family Medicine

## 2017-04-20 ENCOUNTER — Telehealth: Payer: Self-pay

## 2017-04-20 ENCOUNTER — Encounter: Payer: Self-pay | Admitting: Family Medicine

## 2017-04-20 ENCOUNTER — Ambulatory Visit (INDEPENDENT_AMBULATORY_CARE_PROVIDER_SITE_OTHER): Payer: 59 | Admitting: Family Medicine

## 2017-04-20 VITALS — BP 138/82 | HR 71 | Temp 97.9°F | Resp 16 | Ht 65.35 in | Wt 274.0 lb

## 2017-04-20 DIAGNOSIS — Z1239 Encounter for other screening for malignant neoplasm of breast: Secondary | ICD-10-CM

## 2017-04-20 DIAGNOSIS — E785 Hyperlipidemia, unspecified: Secondary | ICD-10-CM

## 2017-04-20 DIAGNOSIS — E1122 Type 2 diabetes mellitus with diabetic chronic kidney disease: Secondary | ICD-10-CM

## 2017-04-20 DIAGNOSIS — Z23 Encounter for immunization: Secondary | ICD-10-CM

## 2017-04-20 DIAGNOSIS — R5383 Other fatigue: Secondary | ICD-10-CM

## 2017-04-20 DIAGNOSIS — I10 Essential (primary) hypertension: Secondary | ICD-10-CM

## 2017-04-20 DIAGNOSIS — Z1211 Encounter for screening for malignant neoplasm of colon: Secondary | ICD-10-CM

## 2017-04-20 DIAGNOSIS — R351 Nocturia: Secondary | ICD-10-CM

## 2017-04-20 DIAGNOSIS — L309 Dermatitis, unspecified: Secondary | ICD-10-CM

## 2017-04-20 DIAGNOSIS — E039 Hypothyroidism, unspecified: Secondary | ICD-10-CM | POA: Diagnosis not present

## 2017-04-20 DIAGNOSIS — Z1231 Encounter for screening mammogram for malignant neoplasm of breast: Secondary | ICD-10-CM

## 2017-04-20 DIAGNOSIS — Z794 Long term (current) use of insulin: Secondary | ICD-10-CM

## 2017-04-20 MED ORDER — LEVOTHYROXINE SODIUM 125 MCG PO TABS: 125.0000 ug | ORAL_TABLET | Freq: Every day | ORAL | 1 refills | Status: DC

## 2017-04-20 MED ORDER — INDAPAMIDE 1.25 MG PO TABS: 1.2500 mg | ORAL_TABLET | Freq: Every day | ORAL | 1 refills | Status: DC

## 2017-04-20 MED ORDER — TRIAMCINOLONE ACETONIDE 0.1 % EX CREA: 1.0000 "application " | TOPICAL_CREAM | Freq: Two times a day (BID) | CUTANEOUS | 0 refills | Status: DC

## 2017-04-20 MED ORDER — CLOBETASOL PROPIONATE 0.05 % EX FOAM: Freq: Two times a day (BID) | CUTANEOUS | 0 refills | Status: DC

## 2017-04-20 MED ORDER — LABETALOL HCL 100 MG PO TABS: 100.0000 mg | ORAL_TABLET | Freq: Two times a day (BID) | ORAL | 1 refills | Status: DC

## 2017-04-20 NOTE — Patient Instructions (Addendum)
Follow up within next 6 weeks with Sarah to discuss A1C and cholesterol testing. Can discuss fatigue with her at that time as well.   Triamcinolone and Eucerin lotion to rash on body that may be eczema or possible psoriasis. Clobetasol to scalp only.  Call dermatology for appointment as soon as possible.  I referred you for mammogram and colonoscopy.   Return to the clinic or go to the nearest emergency room if any of your symptoms worsen or new symptoms occur.   IF you received an x-ray today, you will receive an invoice from [facility]. Please contact [facility] at [phone] with questions or concerns regarding your invoice.   IF you received labwork today, you will receive an invoice from [facility]. Please contact [facility] at [phone] with questions or concerns regarding your invoice.   Our billing staff will not be able to assist you with questions regarding bills from these companies.  You will be contacted with the lab results as soon as they are available. The fastest way to get your results is to activate your My Chart account. Instructions are located on the last page of this paperwork. If you have not heard from [place] regarding the results in 2 weeks, please contact this office.

## 2017-04-20 NOTE — Progress Notes (Signed)
Subjective:  By signing my name below, I, Moises Blood, attest that this documentation has been prepared under the direction and in the presence of Merri Ray, MD. Electronically Signed: Moises Blood, [place]. 04/20/2017 , 11:25 AM .  Patient was seen in Room 11 .   Patient ID: Rachel Kim, female    DOB: 12-Aug-1955, 61 y.o.   MRN: [id number] Chief Complaint  Patient presents with  . Medication Refill    all medications   . Skin Problem   HPI Rachel Kim is a 61 y.o. female  Here for refill of all her medications, and also complains of skin problem. Her PCP is Weber, Damaris Hippo, PA-C. I last saw her in Sept 2016 for an ankle sprain. She has a history of multiple medical problems, including hypothyroidism, DM, HTN, hyperlipidemia, and chronic kidney disease. Patient email from Sept 11th for temporary prescription for thyroid medication provided.   Rash She complains having itchy rash over her face, scalp, breasts, back, arms and her abdomen. She was treated by dermatologist in the past with steroid cream and a liquid for her scalp. She hasn't seen dermatologist recently. She's been trying to apply [place] eczema cream without any relief. She states it's started about the middle of last year, but worsened recently. She denies lightheadedness, dizziness, shortness of breath or chest tightness.   Health maintenance Mammogram- last done at [place]  Colonoscopy- last done by Dr. Fuller Plan at [place] She requested flu shot today.   Immunization History  Administered Date(s) Administered  . Influenza,inj,Quad PF,6+ Mos 04/29/2015, 04/18/2016  . Pneumococcal Polysaccharide-23 06/01/2001  . Td 10/03/2002  . Tdap 05/10/2011    DM Lab Results  Component Value Date   HGBA1C 7.5 11/25/2016   She is taking Trulicity 1.5mg  injection a week and pravachol 80mg  QD. With chronic kidney disease, she isn't on metformin.   She reports missing about 8 weeks of  Trulicity, restarted about 4 weeks ago; last injection on Wednesday (yesterday). She is also on Toujeo 10 units a day, prescribed by Judson Roch as well.   She hasn't been checking her blood sugars at home. She denies symptomatic lows.   Chronic kidney disease Lab Results  Component Value Date   CREATININE 1.54 (H) 11/25/2016    HTN She is on hydralazine 75mg  TID (three 25mg  tablets TID), lotensin 20mg  QD, indapamide 1.25mg  QD, and labetalol 100mg  BID. She reports feeling fatigue for many years.   Hyperlipidemia Lab Results  Component Value Date   CHOL 174 11/25/2016   HDL 37 (L) 11/25/2016   LDLCALC 65 11/25/2016   TRIG 362 (H) 11/25/2016   CHOLHDL 4.7 (H) 11/25/2016   Lab Results  Component Value Date   ALT 14 11/25/2016   AST 13 11/25/2016   ALKPHOS 94 11/25/2016   BILITOT 0.3 11/25/2016   She is on pravachol 80mg  QD. Her cardiologist is Dr. Sallyanne Kuster. She's been out of it for a few months, but will pick it up tomorrow.   Nocturia She is on Myrbetriq 50mg  for nocturia; denies any side effects.   Hypothyroidism Lab Results  Component Value Date   TSH 1.83 04/18/2016   She is on synthroid 187mcg. She was off of it for about 2 weeks. She's been back on it for about a week now.   Patient Active Problem List   Diagnosis Date Noted  . Overactive bladder 01/28/2015  . Nocturia 01/28/2015  . Headache 11/27/2014  . Accelerated hypertension 11/27/2014  .  CKD (chronic kidney disease) stage 3, GFR 30-59 ml/min 11/27/2014  . Dermatitis seborrheica 12/26/2012  . OSA on CPAP 12/19/2012  . Acne 07/04/2011  . Personal history of diseases of skin or subcutaneous tissue 07/04/2011  . Neurodermatitis 07/04/2011  . Hyperpigmentation of skin, postinflammatory 07/04/2011  . 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 Hypothyroidism 09/28/2006  . Controlled diabetes mellitus type 2 with complications ([place]) 29/52/8413  . Mixed hyperlipidemia 09/28/2006  . Morbid obesity ([place]) 09/28/2006   Past  Medical History:  Diagnosis Date  . Acute on chronic respiratory failure with hypoxia ([place]) 11/27/2014  . Anxiety   . Chronic kidney disease (CKD), stage III (moderate)   . CSA (central sleep apnea)   . Depression   . Diabetes mellitus   . GERD (gastroesophageal reflux disease)   . H/O breast biopsy 02/10/10   calcification , no tumors  . High cholesterol   . Hyperlipidemia   . Hypertension   . Hyperthyroidism    diagnosed 1989  . LVH (left ventricular hypertrophy)    echo 02/25/10 EF >55%  . OSA (obstructive sleep apnea)    severe complex apnea  . Pneumonia 04/04  . RBBB   . Sarcoidosis of lung ([place])   . Sleep apnea    uses CPAP  . Thyroid disease    hypothyroidism   Past Surgical History:  Procedure Laterality Date  . ABDOMINAL HYSTERECTOMY  12/07  . APPENDECTOMY    . CARPAL TUNNEL RELEASE     right and left  . CHOLECYSTECTOMY    . ELBOW FRACTURE SURGERY     with pins and pins removed  . laminectomy c5-c6    . laminectomy l5, s1    . RETROPERITONEAL LYMPH NODE EXCISION     right underarm  . RHINOPLASTY     has had several revisions  . rt knee arthroscopy    . TONSILLECTOMY    . tubinates trimmed     Allergies  Allergen Reactions  . Doxazosin Swelling  . Labetalol Swelling  . Actos [Pioglitazone Hydrochloride] Swelling  . Amlodipine Swelling  . Cozaar [Losartan Potassium] Other (See Comments)    Causes drowsiness/zombie like  . Glipizide Swelling  . Imdur [Isosorbide Dinitrate] Other (See Comments)    Causes severe headaches  . Clindamycin Hcl Rash   Prior to Admission medications   Medication Sig Start Date End Date Taking? Authorizing Provider  acetaminophen (TYLENOL) 500 MG tablet Take 500 mg by mouth every 6 (six) hours as needed.    [provider]  aspirin 81 MG tablet Take 1 tablet (81 mg total) by mouth daily. 11/28/14   Robbie Lis, MD  benazepril (LOTENSIN) 20 MG tablet Take 1 tablet (20 mg total) by mouth daily. 04/11/17   Weber,  Damaris Hippo, PA-C  Cholecalciferol (VITAMIN D) 2000 UNITS CAPS Take by mouth daily.      [provider]  Dulaglutide (TRULICITY) 1.5 KG/4.0NU SOPN Inject 1.5 mg into the skin once a week. 03/03/17   Weber, Damaris Hippo, PA-C  escitalopram (LEXAPRO) 20 MG tablet Take 1.5 tablets (30 mg total) by mouth daily. 11/25/16   Forrest Moron, MD  hydrALAZINE (APRESOLINE) 50 MG tablet Take 1.5 tablets (75 mg total) by mouth 3 (three) times daily. 05/25/16   Croitoru, Mihai, MD  hydrOXYzine (ATARAX/VISTARIL) 50 MG tablet Take 1-1.5 tablets (50-75 mg total) by mouth at bedtime as needed. 04/18/16   Jaynee Eagles, PA-C  indapamide (LOZOL) 1.25 MG tablet TAKE 1 TABLET BY MOUTH ONCE DAILY  04/10/17   Weber, Damaris Hippo, PA-C  Insulin Pen Needle (BD PEN NEEDLE NANO U/F) 32G X 4 MM MISC 1 application by Does not apply route daily. 03/13/15   Darlyne Russian, MD  labetalol (NORMODYNE) 100 MG tablet Take 1 tablet (100 mg total) by mouth 2 (two) times daily. 04/11/17   Weber, Damaris Hippo, PA-C  levothyroxine (SYNTHROID, LEVOTHROID) 125 MCG tablet Take 1 tablet (125 mcg total) by mouth daily before breakfast. 04/11/17   Weber, Damaris Hippo, PA-C  Magnesium 250 MG TABS Take 1 tablet by mouth 2 (two) times daily.     [provider]  mirabegron ER (MYRBETRIQ) 50 MG TB24 tablet Take 1 tablet (50 mg total) by mouth daily. 11/25/16   Forrest Moron, MD  Multiple Vitamins-Minerals (PRESERVISION AREDS 2 PO) Take 1 tablet by mouth 2 (two) times daily.    [provider]  pravastatin (PRAVACHOL) 80 MG tablet Take 1 tablet (80 mg total) by mouth every morning. 05/25/16   Croitoru, Dani Gobble, MD   Social History   Social History  . Marital status: Single    Spouse name: n/a  . Number of children: 0  . Years of education: Master's   Occupational History  . Quality Engineer    Social History Main Topics  . Smoking status: Never Smoker  . Smokeless tobacco: Never Used  . Alcohol use No  . Drug use: No  . Sexual activity: No      Comment: hysterectomy   Other Topics Concern  . Not on file   Social History Narrative   Lives alone with 2 kitties.   Unemployed for a while. To start a new job 07/2015.         Review of Systems  Constitutional: Positive for fatigue. Negative for unexpected weight change.  Respiratory: Negative for chest tightness and shortness of breath.   Cardiovascular: Negative for chest pain, palpitations and leg swelling.  Gastrointestinal: Negative for abdominal pain and blood in stool.  Skin: Positive for rash.  Neurological: Negative for dizziness, syncope, light-headedness and headaches.       Objective:   Physical Exam  Constitutional: She is oriented to person, place, and time. She appears well-developed and well-nourished.  HENT:  Head: Normocephalic and atraumatic.  Eyes: Pupils are equal, round, and reactive to light. Conjunctivae and EOM are normal.  Neck: Carotid bruit is not present.  Cardiovascular: Normal rate, regular rhythm, normal heart sounds and intact distal pulses.   Pulmonary/Chest: Effort normal and breath sounds normal.  Abdominal: Soft. She exhibits no pulsatile midline mass. There is no tenderness.  Neurological: She is alert and oriented to person, place, and time.  Skin: Skin is warm and dry.  Large excoriated patches over right anterior lower leg, right forearm with some thickened scale, right breast lateral to umbilicus with some thickened scale; as well as front and behind bilateral ears into the hairline of her scalp  Psychiatric: She has a normal mood and affect. Her behavior is normal.  Vitals reviewed.   Vitals:   04/20/17 1029  BP: 138/82  Pulse: 71  Resp: 16  Temp: 97.9 F (36.6 C)  TempSrc: Oral  SpO2: 98%  Weight: 274 lb (124.3 kg)  Height: 5' 5.35" (1.66 m)      Assessment & Plan:   TATIYANNA LASHLEY is a 61 y.o. female Type 2 diabetes mellitus with chronic kidney disease, with long-term current use of insulin, unspecified CKD stage  (HCC)  - A1c deferred as  restarted medication had been out. Plan for A1c in the next month or 2 with her primary care provider  Essential hypertension - Plan: indapamide (LOZOL) 1.25 MG tablet, labetalol (NORMODYNE) 100 MG tablet, Comprehensive metabolic panel  -Overall stable. Ideal goal of less than 130/80 with diabetes.  Hyperlipidemia, unspecified hyperlipidemia type  -Recently restarted statin. Lipid testing deferred more accurate results.   Nocturia  -Stable with Myrbetriq, no changes for now  Eczema, unspecified type - Plan: triamcinolone cream (KENALOG) 0.1 %, clobetasol (OLUX) 0.05 % topical foam  - Eczema versus psoriasis based on location and appearance. Start with Kenalog 0.1% topical twice a day to skin, clobetasol foam for scalp, Eucerin or other hydrating lotion to patches on skin, follow-up with dermatology.  Hypothyroidism, unspecified type - Plan: levothyroxine (SYNTHROID, LEVOTHROID) 125 MCG tablet  -TSH deferred as recently had restarted medication. Continue same dose of Synthroid for now, follow up with primary care provider as below.  Screen for colon cancer - Plan: Ambulatory referral to Gastroenterology  Screening for breast cancer - Plan: MM DIGITAL SCREENING BILATERAL  Fatigue, unspecified type - Plan: CBC  -Standing. Check CBC, follow-up with primary care provider to discuss further  Need for prophylactic vaccination and inoculation against influenza - Plan: Flu Vaccine QUAD 36+ mos IM    Meds ordered this encounter  Medications  . indapamide (LOZOL) 1.25 MG tablet    Sig: Take 1 tablet (1.25 mg total) by mouth daily.    Dispense:  90 tablet    Refill:  1  . labetalol (NORMODYNE) 100 MG tablet    Sig: Take 1 tablet (100 mg total) by mouth 2 (two) times daily.    Dispense:  180 tablet    Refill:  1  . levothyroxine (SYNTHROID, LEVOTHROID) 125 MCG tablet    Sig: Take 1 tablet (125 mcg total) by mouth daily before breakfast.    Dispense:  90 tablet     Refill:  1  . triamcinolone cream (KENALOG) 0.1 %    Sig: Apply 1 application topically 2 (two) times daily.    Dispense:  30 g    Refill:  0  . clobetasol (OLUX) 0.05 % topical foam    Sig: Apply topically 2 (two) times daily. As needed to scalp rash.    Dispense:  50 g    Refill:  0   Patient Instructions   Follow up within next 6 weeks with Sarah to discuss A1C and cholesterol testing. Can discuss fatigue with her at that time as well.   Triamcinolone and Eucerin lotion to rash on body that may be eczema or possible psoriasis. Clobetasol to scalp only.  Call dermatology for appointment as soon as possible.  I referred you for mammogram and colonoscopy.   Return to the clinic or go to the nearest emergency room if any of your symptoms worsen or new symptoms occur.   IF you received an x-ray today, you will receive an invoice from [facility]. Please contact [facility] at [phone] with questions or concerns regarding your invoice.   IF you received labwork today, you will receive an invoice from [place]. Please contact [facility] at [phone] with questions or concerns regarding your invoice.   Our billing staff will not be able to assist you with questions regarding bills from these companies.  You will be contacted with the lab results as soon as they are available. The fastest way to get your results is to activate your My Chart account. Instructions are located  on the last page of this paperwork. If you have not heard from [place] regarding the results in 2 weeks, please contact this office.       I personally performed the services described in this documentation, which was scribed in my presence. The recorded information has been reviewed and considered for accuracy and completeness, addended by me as needed, and agree with information above.  Signed,   Merri Ray, MD [facility] at [place].  04/23/17 12:13 PM

## 2017-04-21 LAB — CBC
Hematocrit: 38.4 % (ref 34.0–46.6)
Hemoglobin: 12.5 g/dL (ref 11.1–15.9)
MCH: 28.2 pg (ref 26.6–33.0)
MCHC: 32.6 g/dL (ref 31.5–35.7)
MCV: 87 fL (ref 79–97)
PLATELETS: 175 10*3/uL (ref 150–379)
RBC: 4.43 x10E6/uL (ref 3.77–5.28)
RDW: 14.4 % (ref 12.3–15.4)
WBC: 6.9 10*3/uL (ref 3.4–10.8)

## 2017-04-21 LAB — COMPREHENSIVE METABOLIC PANEL
ALBUMIN: 4.7 g/dL (ref 3.6–4.8)
ALK PHOS: 95 IU/L (ref 39–117)
ALT: 18 IU/L (ref 0–32)
AST: 23 IU/L (ref 0–40)
Albumin/Globulin Ratio: 2 (ref 1.2–2.2)
BILIRUBIN TOTAL: 0.3 mg/dL (ref 0.0–1.2)
BUN / CREAT RATIO: 19 (ref 12–28)
BUN: 30 mg/dL — AB (ref 8–27)
CHLORIDE: 101 mmol/L (ref 96–106)
CO2: 23 mmol/L (ref 20–29)
CREATININE: 1.62 mg/dL — AB (ref 0.57–1.00)
Calcium: 9 mg/dL (ref 8.7–10.3)
GFR calc Af Amer: 39 mL/min/{1.73_m2} — ABNORMAL LOW (ref >59)
GFR calc non Af Amer: 34 mL/min/{1.73_m2} — ABNORMAL LOW (ref >59)
GLUCOSE: 113 mg/dL — AB (ref 65–99)
Globulin, Total: 2.4 g/dL (ref 1.5–4.5)
Potassium: 3.9 mmol/L (ref 3.5–5.2)
SODIUM: 144 mmol/L (ref 134–144)
Total Protein: 7.1 g/dL (ref 6.0–8.5)

## 2017-04-27 ENCOUNTER — Other Ambulatory Visit: Payer: Self-pay | Admitting: Urgent Care

## 2017-04-27 DIAGNOSIS — I10 Essential (primary) hypertension: Secondary | ICD-10-CM

## 2017-04-29 NOTE — Telephone Encounter (Signed)
Refill request for benazepril 20 mg denied.  #90 with 3 refills approved on 04/11/17.  Refill not appropriate.

## 2017-05-12 ENCOUNTER — Other Ambulatory Visit: Payer: Self-pay | Admitting: Urgent Care

## 2017-05-12 DIAGNOSIS — I10 Essential (primary) hypertension: Secondary | ICD-10-CM

## 2017-05-15 NOTE — Telephone Encounter (Signed)
Done

## 2017-05-22 ENCOUNTER — Encounter: Payer: Self-pay | Admitting: Family Medicine

## 2017-05-27 NOTE — Telephone Encounter (Signed)
error 

## 2017-05-30 ENCOUNTER — Encounter: Payer: Self-pay | Admitting: Physician Assistant

## 2017-05-30 ENCOUNTER — Ambulatory Visit (INDEPENDENT_AMBULATORY_CARE_PROVIDER_SITE_OTHER): Payer: 59 | Admitting: Physician Assistant

## 2017-05-30 VITALS — BP 154/90 | HR 81 | Temp 98.3°F | Resp 18 | Ht 65.35 in | Wt 276.2 lb

## 2017-05-30 DIAGNOSIS — G4733 Obstructive sleep apnea (adult) (pediatric): Secondary | ICD-10-CM | POA: Diagnosis not present

## 2017-05-30 DIAGNOSIS — Z794 Long term (current) use of insulin: Secondary | ICD-10-CM

## 2017-05-30 DIAGNOSIS — E118 Type 2 diabetes mellitus with unspecified complications: Secondary | ICD-10-CM | POA: Diagnosis not present

## 2017-05-30 DIAGNOSIS — E039 Hypothyroidism, unspecified: Secondary | ICD-10-CM

## 2017-05-30 DIAGNOSIS — N183 Chronic kidney disease, stage 3 unspecified: Secondary | ICD-10-CM

## 2017-05-30 DIAGNOSIS — L409 Psoriasis, unspecified: Secondary | ICD-10-CM | POA: Diagnosis not present

## 2017-05-30 DIAGNOSIS — I10 Essential (primary) hypertension: Secondary | ICD-10-CM

## 2017-05-30 DIAGNOSIS — E782 Mixed hyperlipidemia: Secondary | ICD-10-CM

## 2017-05-30 DIAGNOSIS — Z9989 Dependence on other enabling machines and devices: Secondary | ICD-10-CM | POA: Diagnosis not present

## 2017-05-30 MED ORDER — DULAGLUTIDE 1.5 MG/0.5ML ~~LOC~~ SOAJ: 1.5000 mg | SUBCUTANEOUS | 3 refills | Status: DC

## 2017-05-30 NOTE — Patient Instructions (Signed)
     IF you received an x-ray today, you will receive an invoice from [facility]. Please contact [facility] at [phone] with questions or concerns regarding your invoice.   IF you received labwork today, you will receive an invoice from [facility]. Please contact [facility] at [phone] with questions or concerns regarding your invoice.   Our billing staff will not be able to assist you with questions regarding bills from these companies.  You will be contacted with the lab results as soon as they are available. The fastest way to get your results is to activate your My Chart account. Instructions are located on the last page of this paperwork. If you have not heard from us regarding the results in 2 weeks, please contact this office.     

## 2017-05-30 NOTE — Progress Notes (Signed)
Rachel Kim  MRN: [id number] DOB: [birth year]  PCP: Mancel Bale, PA-C  Chief Complaint  Patient presents with  . Diabetes    follow up     Subjective:  Pt presents to clinic for DM follow-up.  She has been doing good. She is not checking her glucose at home.  Last year she stopped her tuojeo and her A1C increased and so at least 4 months ago she restarted at 40 U - she had been on 20U previously but she did not want to use to much.  She is using her medications as Rx - she did run out of trulicity but has been back on it for at least 3 months as she uses [facility] patient assistance program.    Recent diagnosis of psoriasis by dermatologist.  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  History is obtained by patient.  Review of Systems  Constitutional: Negative for chills and fever.  Eyes: Negative for visual disturbance.  Respiratory: Negative for shortness of breath.   Cardiovascular: Negative for chest pain, palpitations and leg swelling.  Skin: Negative for wound.  Neurological: Negative for dizziness, light-headedness, numbness and headaches.    Patient Active Problem List   Diagnosis Date Noted  . Psoriasis 05/30/2017  . Overactive bladder 01/28/2015  . Nocturia 01/28/2015  . Headache 11/27/2014  . CKD (chronic kidney disease) stage 3, GFR 30-59 ml/min (HCC) 11/27/2014  . Dermatitis seborrheica 12/26/2012  . OSA on CPAP 12/19/2012  . Acne 07/04/2011  . Personal history of diseases of skin or subcutaneous tissue 07/04/2011  . Neurodermatitis 07/04/2011  . Hyperpigmentation of skin, postinflammatory 07/04/2011  . 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 Hypothyroidism 09/28/2006  . Controlled diabetes mellitus type 2 with complications ([place]) 02/63/7858  . Mixed hyperlipidemia 09/28/2006  . Morbid obesity ([place]) 09/28/2006  . HYPERTENSION, BENIGN SYSTEMIC 09/28/2006    Current Outpatient  Prescriptions on File Prior to Visit  Medication Sig Dispense Refill  . acetaminophen (TYLENOL) 500 MG tablet Take 500 mg by mouth every 6 (six) hours as needed.    Marland Kitchen aspirin 81 MG tablet Take 1 tablet (81 mg total) by mouth daily. 30 tablet 0  . benazepril (LOTENSIN) 20 MG tablet TAKE 1 TABLET BY MOUTH ONCE DAILY NEED TO MAKE AN APPOINTMENT 30 tablet 0  . Cholecalciferol (VITAMIN D) 2000 UNITS CAPS Take by mouth daily.      Marland Kitchen escitalopram (LEXAPRO) 20 MG tablet Take 1.5 tablets (30 mg total) by mouth daily. 45 tablet 5  . hydrALAZINE (APRESOLINE) 50 MG tablet Take 1.5 tablets (75 mg total) by mouth 3 (three) times daily. 405 tablet 3  . indapamide (LOZOL) 1.25 MG tablet Take 1 tablet (1.25 mg total) by mouth daily. 90 tablet 1  . labetalol (NORMODYNE) 100 MG tablet Take 1 tablet (100 mg total) by mouth 2 (two) times daily. 180 tablet 1  . levothyroxine (SYNTHROID, LEVOTHROID) 125 MCG tablet Take 1 tablet (125 mcg total) by mouth daily before breakfast. 90 tablet 1  . Magnesium 400 MG TABS Take 1 tablet by mouth 2 (two) times daily.     . mirabegron ER (MYRBETRIQ) 50 MG TB24 tablet Take 1 tablet (50 mg total) by mouth daily. 90 tablet 1  . Multiple Vitamins-Minerals (PRESERVISION AREDS 2 PO) Take 1 tablet by mouth 2 (two) times daily.    . pravastatin (PRAVACHOL) 80 MG tablet Take 1 tablet (80 mg  total) by mouth every morning. 90 tablet 3  . triamcinolone cream (KENALOG) 0.1 % Apply 1 application topically 2 (two) times daily. 30 g 0   No current facility-administered medications on file prior to visit.     Allergies  Allergen Reactions  . Doxazosin Swelling  . Labetalol Swelling  . Actos [Pioglitazone Hydrochloride] Swelling  . Amlodipine Swelling  . Cozaar [Losartan Potassium] Other (See Comments)    Causes drowsiness/zombie like  . Glipizide Swelling  . Imdur [Isosorbide Dinitrate] Other (See Comments)    Causes severe headaches  . Clindamycin Hcl Rash    Past Medical History:    Diagnosis Date  . Acute on chronic respiratory failure with hypoxia ([place]) 11/27/2014  . Anxiety   . Chronic kidney disease (CKD), stage III (moderate) (HCC)   . CSA (central sleep apnea)   . Depression   . Diabetes mellitus   . GERD (gastroesophageal reflux disease)   . H/O breast biopsy 02/10/10   calcification , no tumors  . High cholesterol   . Hyperlipidemia   . Hypertension   . Hyperthyroidism    diagnosed 1989  . LVH (left ventricular hypertrophy)    echo 02/25/10 EF >55%  . OSA (obstructive sleep apnea)    severe complex apnea  . Pneumonia 04/04  . RBBB   . Sarcoidosis of lung ([place])   . Sleep apnea    uses CPAP  . Thyroid disease    hypothyroidism   Social History   Social History Narrative   Lives alone with 2 kitties.   Unemployed for a while. To start a new job 07/2015.         Social History  Substance Use Topics  . Smoking status: Never Smoker  . Smokeless tobacco: Never Used  . Alcohol use No   family history includes Colitis in her father; Colon cancer in her maternal grandmother; Hypertension in her mother; Stroke in her mother; Thyroid disease in her brother, brother, father, mother, sister, and sister.     Objective:  BP (!) 154/90   Pulse 81   Temp 98.3 F (36.8 C) (Oral)   Resp 18   Ht 5' 5.35" (1.66 m)   Wt 276 lb 3.2 oz (125.3 kg)   LMP 07/01/2006   SpO2 96%   BMI 45.47 kg/m  Body mass index is 45.47 kg/m.  Physical Exam  Constitutional: She is oriented to person, place, and time and well-developed, well-nourished, and in no distress.  HENT:  Head: Normocephalic and atraumatic.  Right Ear: Hearing and external ear normal.  Left Ear: Hearing and external ear normal.  Eyes: Conjunctivae are normal.  Neck: Normal range of motion.  Cardiovascular: Normal rate, regular rhythm and normal heart sounds.   No murmur heard. Pulmonary/Chest: Effort normal and breath sounds normal.  Musculoskeletal:       Right lower leg: She exhibits no  edema.       Left lower leg: She exhibits no edema.  Neurological: She is alert and oriented to person, place, and time. Gait normal.  Skin: Skin is warm and dry.  Psychiatric: Mood, memory, affect and judgment normal.  Vitals reviewed.   Assessment and Plan :  HYPERTENSION, BENIGN SYSTEMIC - Plan: CMP14+EGFR - no controlled- pt to check at home as this is higher than normal for the patient.  Psoriasis - continue with dermatolgist  OSA on CPAP - uses daily  Controlled type 2 diabetes mellitus with complication, with long-term current use of insulin ([place]) -  Plan: Hemoglobin A1c, Dulaglutide (TRULICITY) 1.5 PB/2.2VO SOPN, HM DIABETES FOOT EXAM  Hypothyroidism, unspecified type - Plan: TSH, T4, Free - check labs - adjust medication if needed  CKD (chronic kidney disease) stage 3, GFR 30-59 ml/min (HCC) - check labs to see if stable  Mixed hyperlipidemia - Plan: Lipid panel - check labs - she has been using the pravachol daily for at least the last month  She is aware that she needs mammogram and colonoscopy but she needs to check with her insurance to see about coverage.  She will also find out about Hep C coverage.  She plans to schedule ann eye visit.  Windell Hummingbird PA-C  [facility] 05/30/2017 12:36 PM

## 2017-05-31 LAB — CMP14+EGFR
A/G RATIO: 1.9 (ref 1.2–2.2)
ALT: 21 IU/L (ref 0–32)
AST: 22 IU/L (ref 0–40)
Albumin: 4.1 g/dL (ref 3.6–4.8)
Alkaline Phosphatase: 99 IU/L (ref 39–117)
BUN/Creatinine Ratio: 17 (ref 12–28)
BUN: 26 mg/dL (ref 8–27)
Bilirubin Total: 0.3 mg/dL (ref 0.0–1.2)
CALCIUM: 9 mg/dL (ref 8.7–10.3)
CO2: 30 mmol/L — AB (ref 20–29)
CREATININE: 1.57 mg/dL — AB (ref 0.57–1.00)
Chloride: 99 mmol/L (ref 96–106)
GFR, EST AFRICAN AMERICAN: 41 mL/min/{1.73_m2} — AB (ref >59)
GFR, EST NON AFRICAN AMERICAN: 35 mL/min/{1.73_m2} — AB (ref >59)
Globulin, Total: 2.2 g/dL (ref 1.5–4.5)
Glucose: 158 mg/dL — ABNORMAL HIGH (ref 65–99)
POTASSIUM: 4.2 mmol/L (ref 3.5–5.2)
Sodium: 143 mmol/L (ref 134–144)
TOTAL PROTEIN: 6.3 g/dL (ref 6.0–8.5)

## 2017-05-31 LAB — TSH: TSH: 2.98 u[IU]/mL (ref 0.450–4.500)

## 2017-05-31 LAB — HEMOGLOBIN A1C
Est. average glucose Bld gHb Est-mCnc: 137 mg/dL
Hgb A1c MFr Bld: 6.4 % — ABNORMAL HIGH (ref 4.8–5.6)

## 2017-05-31 LAB — LIPID PANEL
CHOLESTEROL TOTAL: 139 mg/dL (ref 100–199)
Chol/HDL Ratio: 4.2 ratio (ref 0.0–4.4)
HDL: 33 mg/dL — AB (ref >39)
LDL CALC: 53 mg/dL (ref 0–99)
TRIGLYCERIDES: 265 mg/dL — AB (ref 0–149)
VLDL CHOLESTEROL CAL: 53 mg/dL — AB (ref 5–40)

## 2017-05-31 LAB — T4, FREE: Free T4: 1.13 ng/dL (ref 0.82–1.77)

## 2017-06-05 ENCOUNTER — Ambulatory Visit (INDEPENDENT_AMBULATORY_CARE_PROVIDER_SITE_OTHER): Payer: 59 | Admitting: Cardiovascular Disease

## 2017-06-05 VITALS — BP 178/106 | HR 81 | Ht 65.0 in | Wt 274.0 lb

## 2017-06-05 DIAGNOSIS — N183 Chronic kidney disease, stage 3 unspecified: Secondary | ICD-10-CM

## 2017-06-05 DIAGNOSIS — Z9989 Dependence on other enabling machines and devices: Secondary | ICD-10-CM | POA: Diagnosis not present

## 2017-06-05 DIAGNOSIS — E118 Type 2 diabetes mellitus with unspecified complications: Secondary | ICD-10-CM

## 2017-06-05 DIAGNOSIS — G4733 Obstructive sleep apnea (adult) (pediatric): Secondary | ICD-10-CM | POA: Diagnosis not present

## 2017-06-05 DIAGNOSIS — E782 Mixed hyperlipidemia: Secondary | ICD-10-CM | POA: Diagnosis not present

## 2017-06-05 DIAGNOSIS — Z794 Long term (current) use of insulin: Secondary | ICD-10-CM

## 2017-06-05 DIAGNOSIS — I10 Essential (primary) hypertension: Secondary | ICD-10-CM

## 2017-06-05 MED ORDER — HYDRALAZINE HCL 100 MG PO TABS: 100.0000 mg | ORAL_TABLET | Freq: Three times a day (TID) | ORAL | 3 refills | Status: DC

## 2017-06-05 NOTE — Patient Instructions (Signed)
Dr Sallyanne Kuster has recommended making the following medication changes: 1. INCREASE Hydralazine to 100 mg three times daily 2. STOP Ibuprofen  Your physician recommends that you schedule a follow-up appointment in 6 months. You will receive a reminder letter in the mail two months in advance. If you don't receive a letter, please call our office to schedule the follow-up appointment.  If you need a refill on your cardiac medications before your next appointment, please call your pharmacy.

## 2017-06-05 NOTE — Progress Notes (Signed)
Cardiology Office Note    Date:  06/06/2017   ID:  Rachel Kim, DOB [birth year], MRN [id number]  PCP:  Mancel Bale, PA-C  Cardiologist:   Sanda Klein, MD   Chief Complaint  Patient presents with  . Follow-up    12 months  . Headache  . Shortness of Breath    History of Present Illness:  Rachel Kim is a 61 y.o. female with morbid obesity associated with multiple complications including hearts to control hypertension, diabetes mellitus requiring insulin, hyperlipidemia and obstructive sleep apnea.  Despite the recent change in her medications her blood pressure today is quite elevated at 178/106.  She reports that at home she has seen blood pressure as low as 138/84, but often her blood pressure is higher than that.  She has not had any problems with hypotension.  She was taking Myrbetriq has helped with incontinence, but can increase her blood pressure.  She has been taking ibuprofen at least twice a day for joint problems.  She has developed extensive psoriatic plaques, this is a new problem for her.  She is currently using topical triamcinolone cream, with a plan to seek eligibility for Humira.  She is still working in a temporary job but is attempting to find more permanent employment that will offer insurance benefits.  She will run out of insurance before the end of the year.  Remains interested in bariatric surgery.  She has NYHA functional class II exertional dyspnea, no change from her previous appointment.  She specifically denies any chest pain at rest exertion, dyspnea at rest, orthopnea, paroxysmal nocturnal dyspnea, syncope, palpitations, focal neurological deficits, intermittent claudication, lower extremity edema, unexplained weight gain, cough, hemoptysis or wheezing.   Past Medical History:  Diagnosis Date  . Acute on chronic respiratory failure with hypoxia ([place]) 11/27/2014  . Anxiety   . Chronic kidney disease (CKD), stage III (moderate) (HCC)   . CSA  (central sleep apnea)   . Depression   . Diabetes mellitus   . GERD (gastroesophageal reflux disease)   . H/O breast biopsy 02/10/10   calcification , no tumors  . High cholesterol   . Hyperlipidemia   . Hypertension   . Hyperthyroidism    diagnosed 1989  . LVH (left ventricular hypertrophy)    echo 02/25/10 EF >55%  . OSA (obstructive sleep apnea)    severe complex apnea  . Pneumonia 04/04  . RBBB   . Sarcoidosis of lung ([place])   . Sleep apnea    uses CPAP  . Thyroid disease    hypothyroidism    Past Surgical History:  Procedure Laterality Date  . ABDOMINAL HYSTERECTOMY  12/07  . APPENDECTOMY    . CARPAL TUNNEL RELEASE     right and left  . CHOLECYSTECTOMY    . ELBOW FRACTURE SURGERY     with pins and pins removed  . laminectomy c5-c6    . laminectomy l5, s1    . RETROPERITONEAL LYMPH NODE EXCISION     right underarm  . RHINOPLASTY     has had several revisions  . rt knee arthroscopy    . TONSILLECTOMY    . tubinates trimmed      Current Medications: Outpatient Medications Prior to Visit  Medication Sig Dispense Refill  . acetaminophen (TYLENOL) 650 MG CR tablet Take 650 mg 2 (two) times daily by mouth.    Marland Kitchen aspirin 81 MG tablet Take 1 tablet (81 mg total) by mouth daily. 30 tablet  0  . benazepril (LOTENSIN) 20 MG tablet TAKE 1 TABLET BY MOUTH ONCE DAILY NEED TO MAKE AN APPOINTMENT 30 tablet 0  . Cholecalciferol (VITAMIN D) 2000 UNITS CAPS Take by mouth daily.      . Dulaglutide (TRULICITY) 1.5 HQ/7.5FF SOPN Inject 1.5 mg into the skin once a week. 12 pen 3  . escitalopram (LEXAPRO) 20 MG tablet Take 1.5 tablets (30 mg total) by mouth daily. 45 tablet 5  . indapamide (LOZOL) 1.25 MG tablet Take 1 tablet (1.25 mg total) by mouth daily. 90 tablet 1  . Insulin Glargine (TOUJEO SOLOSTAR) 300 UNIT/ML SOPN Inject 13 Units into the skin at bedtime.    Marland Kitchen labetalol (NORMODYNE) 100 MG tablet Take 1 tablet (100 mg total) by mouth 2 (two) times daily. 180 tablet 1  .  levothyroxine (SYNTHROID, LEVOTHROID) 125 MCG tablet Take 1 tablet (125 mcg total) by mouth daily before breakfast. 90 tablet 1  . Magnesium 400 MG TABS Take 1 tablet by mouth 2 (two) times daily.     . Melatonin 1 MG TABS Take 2 mg at bedtime by mouth.    . mirabegron ER (MYRBETRIQ) 50 MG TB24 tablet Take 1 tablet (50 mg total) by mouth daily. 90 tablet 1  . Multiple Vitamins-Minerals (PRESERVISION AREDS 2 PO) Take 1 tablet by mouth 2 (two) times daily.    . pravastatin (PRAVACHOL) 80 MG tablet Take 1 tablet (80 mg total) by mouth every morning. 90 tablet 3  . triamcinolone cream (KENALOG) 0.1 % Apply 1 application topically 2 (two) times daily. 30 g 0  . hydrALAZINE (APRESOLINE) 25 MG tablet Take 25 mg 3 (three) times daily by mouth.    . IBUPROFEN PO Take 500 mg 2 (two) times daily by mouth.    Marland Kitchen acetaminophen (TYLENOL) 500 MG tablet Take 500 mg by mouth every 6 (six) hours as needed.    . hydrALAZINE (APRESOLINE) 50 MG tablet Take 1.5 tablets (75 mg total) by mouth 3 (three) times daily. 405 tablet 3   No facility-administered medications prior to visit.      Allergies:   Doxazosin; Labetalol; Actos [pioglitazone hydrochloride]; Amlodipine; Cozaar [losartan potassium]; Glipizide; Imdur [isosorbide dinitrate]; and Clindamycin hcl   Social History   Socioeconomic History  . Marital status: Single    Spouse name: n/a  . Number of children: 0  . Years of education: Master's  . Highest education level: None  Social Needs  . Financial resource strain: None  . Food insecurity - worry: None  . Food insecurity - inability: None  . Transportation needs - medical: None  . Transportation needs - non-medical: None  Occupational History  . Occupation: Corporate investment banker  Tobacco Use  . Smoking status: Never Smoker  . Smokeless tobacco: Never Used  Substance and Sexual Activity  . Alcohol use: No  . Drug use: No  . Sexual activity: No    Partners: Male    Birth control/protection:  Surgical    Comment: hysterectomy  Other Topics Concern  . None  Social History Narrative   Lives alone with 2 kitties.   Unemployed for a while. To start a new job 07/2015.        Family History:  The patient's family history includes Colitis in her father; Colon cancer in her maternal grandmother; Hypertension in her mother; Stroke in her mother; Thyroid disease in her brother, brother, father, mother, sister, and sister.   ROS:   Please see the history of present illness.  ROS All other systems reviewed and are negative.   PHYSICAL EXAM:   VS:  BP (!) 178/106   Pulse 81   Ht 5\' 5"  (1.651 m)   Wt 274 lb (124.3 kg)   LMP 07/01/2006   BMI 45.60 kg/m     General: Alert, oriented x3, no distress, morbidly obese Head: no evidence of trauma, PERRL, EOMI, no exophtalmos or lid lag, no myxedema, no xanthelasma; normal ears, nose and oropharynx Neck: normal jugular venous pulsations and no hepatojugular reflux; brisk carotid pulses without delay and no carotid bruits Chest: clear to auscultation, no signs of consolidation by percussion or palpation, normal fremitus, symmetrical and full respiratory excursions Cardiovascular: normal position and quality of the apical impulse, regular rhythm, normal first and widely split second heart sounds, no murmurs, rubs or gallops Abdomen: no tenderness or distention, no masses by palpation, no abnormal pulsatility or arterial bruits, normal bowel sounds, no hepatosplenomegaly Extremities: no clubbing, cyanosis; 1+ symmetrical ankle edema; 2+ radial, ulnar and brachial pulses bilaterally; 2+ right femoral, posterior tibial and dorsalis pedis pulses; 2+ left femoral, posterior tibial and dorsalis pedis pulses; no subclavian or femoral bruits Neurological: grossly nonfocal Psych: Normal mood and affect   Wt Readings from Last 3 Encounters:  06/05/17 274 lb (124.3 kg)  05/30/17 276 lb 3.2 oz (125.3 kg)  04/20/17 274 lb (124.3 kg)       Studies/Labs Reviewed:   EKG:  EKG is ordered today.  The ekg ordered today shows normal sinus rhythm, pre-existing right bundle branch block and left anterior fascicular block, QRS duration 144 ms, QTC 511 ms Recent Labs: 04/20/2017: Hemoglobin 12.5; Platelets 175 05/30/2017: ALT 21; BUN 26; Creatinine, Ser 1.57; Potassium 4.2; Sodium 143; TSH 2.980   Lipid Panel    Component Value Date/Time   CHOL 139 05/30/2017 1016   TRIG 265 (H) 05/30/2017 1016   HDL 33 (L) 05/30/2017 1016   CHOLHDL 4.2 05/30/2017 1016   CHOLHDL 5.1 (H) 04/18/2016 1729   VLDL 34 (H) 04/18/2016 1729   LDLCALC 53 05/30/2017 1016    ASSESSMENT:    1. HYPERTENSION, BENIGN SYSTEMIC   2. Mixed hyperlipidemia   3. Controlled type 2 diabetes mellitus with complication, with long-term current use of insulin ([place])   4. CKD (chronic kidney disease) stage 3, GFR 30-59 ml/min (HCC)   5. Morbid obesity ([place])   6. OSA on CPAP      PLAN:  In order of problems listed above:  1. HTN: BP remains elevated.  I recommended that she discontinue the ibuprofen completely.  Increase hydralazine to 100 mg 3 times daily.  Despite the use of Actos she does not have evidence of heart failure.  Minimal ankle swelling. 2. HLP: Marked improvement in her LDL cholesterol after increasing the dose of pravastatin.  Now within target range.  Triglycerides remain modestly elevated. 3. DM: Glucose control deteriorated when she came off Toujeo but is rapidly improving now that she is back on this medication. 4. CKD: Renal function has shown a slight improvement over the last couple of years. 5. Morbid obesity: She is trying to find a job that will offer medical insurance and allow her to pursue her wish for bariatric surgery at [facility]. 6. OSA: Compliant with CPAP.    Medication Adjustments/Labs and Tests Ordered: Current medicines are reviewed at length with the patient today.  Concerns regarding medicines are outlined above.   Medication changes, Labs and Tests ordered today are listed in the Patient Instructions  below. Patient Instructions  Dr Sallyanne Kuster has recommended making the following medication changes: 1. INCREASE Hydralazine to 100 mg three times daily 2. STOP Ibuprofen  Your physician recommends that you schedule a follow-up appointment in 6 months. You will receive a reminder letter in the mail two months in advance. If you don't receive a letter, please call our office to schedule the follow-up appointment.  If you need a refill on your cardiac medications before your next appointment, please call your pharmacy.    Signed, Sanda Klein, MD  06/06/2017 9:01 AM    [facility] [place], [place], [place]  [address] Phone: [phone]; Fax: [phone]

## 2017-06-06 ENCOUNTER — Encounter: Payer: Self-pay | Admitting: Cardiovascular Disease

## 2017-06-07 ENCOUNTER — Encounter: Payer: Self-pay | Admitting: Physician Assistant

## 2017-06-07 ENCOUNTER — Ambulatory Visit (INDEPENDENT_AMBULATORY_CARE_PROVIDER_SITE_OTHER): Payer: Managed Care, Other (non HMO) | Admitting: Physician Assistant

## 2017-06-07 VITALS — BP 180/86 | HR 100 | Temp 98.8°F | Resp 18 | Ht 64.8 in | Wt 266.8 lb

## 2017-06-07 DIAGNOSIS — R05 Cough: Secondary | ICD-10-CM | POA: Diagnosis not present

## 2017-06-07 DIAGNOSIS — R030 Elevated blood-pressure reading, without diagnosis of hypertension: Secondary | ICD-10-CM

## 2017-06-07 DIAGNOSIS — J069 Acute upper respiratory infection, unspecified: Secondary | ICD-10-CM

## 2017-06-07 DIAGNOSIS — R0981 Nasal congestion: Secondary | ICD-10-CM | POA: Diagnosis not present

## 2017-06-07 DIAGNOSIS — J3489 Other specified disorders of nose and nasal sinuses: Secondary | ICD-10-CM

## 2017-06-07 DIAGNOSIS — R058 Other specified cough: Secondary | ICD-10-CM

## 2017-06-07 MED ORDER — HYDROCODONE-HOMATROPINE 5-1.5 MG/5ML PO SYRP: 5.0000 mL | ORAL_SOLUTION | Freq: Three times a day (TID) | ORAL | 0 refills | Status: DC | PRN

## 2017-06-07 MED ORDER — IPRATROPIUM BROMIDE 0.03 % NA SOLN: 2.0000 | Freq: Two times a day (BID) | NASAL | 0 refills | Status: DC

## 2017-06-07 MED ORDER — AZITHROMYCIN 250 MG PO TABS: ORAL_TABLET | ORAL | 0 refills | Status: DC

## 2017-06-07 MED ORDER — BENZONATATE 100 MG PO CAPS: 100.0000 mg | ORAL_CAPSULE | Freq: Three times a day (TID) | ORAL | 0 refills | Status: DC | PRN

## 2017-06-07 NOTE — Patient Instructions (Addendum)
- This is likely an acute respiratory viral infection and does not need an antibiotic. I recommend you try the symptomatic treatment with nasal spray, cough syrup, and cough suppressant for the next couple days before you pick up the antibiotic.  If no improvement with symptomatic treatment recommend picking up the Z-Pak. - I recommend you rest, drink plenty of fluids, eat light meals including soups.  - You may use cough syrup at night for your cough and sore throat, Tessalon pearls during the day. Be aware that cough syrup can definitely make you drowsy and sleepy so do not drive or operate any heavy machinery if it is affecting you during the day.  - You may also use Tylenol or ibuprofen over-the-counter for your sore throat.  - Please let me know if you are not seeing any improvement or get worse. Thank you for letting me participate in your health and well being.  Upper Respiratory Infection, Adult Most upper respiratory infections (URIs) are caused by a virus. A URI affects the nose, throat, and upper air passages. The most common type of URI is often called "the common cold." Follow these instructions at home:  Take medicines only as told by your doctor.  Gargle warm saltwater or take cough drops to comfort your throat as told by your doctor.  Use a warm mist humidifier or inhale steam from a shower to increase air moisture. This may make it easier to breathe.  Drink enough fluid to keep your pee (urine) clear or pale yellow.  Eat soups and other clear broths.  Have a healthy diet.  Rest as needed.  Go back to work when your fever is gone or your doctor says it is okay. ? You may need to stay home longer to avoid giving your URI to others. ? You can also wear a face mask and wash your hands often to prevent spread of the virus.  Use your inhaler more if you have asthma.  Do not use any tobacco products, including cigarettes, chewing tobacco, or electronic cigarettes. If you need  help quitting, ask your doctor. Contact a doctor if:  You are getting worse, not better.  Your symptoms are not helped by medicine.  You have chills.  You are getting more short of breath.  You have brown or red mucus.  You have yellow or brown discharge from your nose.  You have pain in your face, especially when you bend forward.  You have a fever.  You have puffy (swollen) neck glands.  You have pain while swallowing.  You have white areas in the back of your throat. Get help right away if:  You have very bad or constant: ? Headache. ? Ear pain. ? Pain in your forehead, behind your eyes, and over your cheekbones (sinus pain). ? Chest pain.  You have long-lasting (chronic) lung disease and any of the following: ? Wheezing. ? Long-lasting cough. ? Coughing up blood. ? A change in your usual mucus.  You have a stiff neck.  You have changes in your: ? Vision. ? Hearing. ? Thinking. ? Mood. This information is not intended to replace advice given to you by your health care provider. Make sure you discuss any questions you have with your health care provider. Document Released: 01/04/2008 Document Revised: 03/20/2016 Document Reviewed: 10/23/2013 Elsevier Interactive Patient Education  2018 [employer].       IF you received an x-ray today, you will receive an invoice from [facility]. Please contact  [facility] at [phone] with questions or concerns regarding your invoice.   IF you received labwork today, you will receive an invoice from [facility]. Please contact [facility] at [phone] with questions or concerns regarding your invoice.   Our billing staff will not be able to assist you with questions regarding bills from these companies.  You will be contacted with the lab results as soon as they are available. The fastest way to get your results is to activate your My Chart account. Instructions are located on the last page of this  paperwork. If you have not heard from [place] regarding the results in 2 weeks, please contact this office.

## 2017-06-07 NOTE — Progress Notes (Signed)
MRN: [id number] DOB: 03-09-1956  Subjective:   Rachel Kim is a 61 y.o. female presenting for chief complaint of Cough (X 3 days- wit sore throat, congestion) and Generalized Body Aches .  Reports 4 day history of sinus congestion, sinus pain, rhinorrhea, ear fullness, sore throat, productive cough (no hemoptysis), myalgia, subjective fever and night sweats. Has tried OTC tylenol cold and flu with no relief. She has not been able to sleep due to the cough and thinks that is why her bp is elevated. She saw her cardiologist yesterday and notes he increased her bp medications.  Denies shortness of breath, chest pain, heart palpitations,  nausea, vomiting, abdominal pain and diarrhea. Has not had sick contact with anyone. No history of seasonal allergies, no history of asthma.  Notes she gets an infection like this once a year and needs cough syrup and a Z-Pak and she will feel better.  Patient has had flu shot this season. Denies smoking. Denies any other aggravating or relieving factors, no other questions or concerns.  Ady has a current medication list which includes the following prescription(s): acetaminophen, aspirin, benazepril, vitamin d, dulaglutide, escitalopram, hydralazine, indapamide, insulin glargine, labetalol, levothyroxine, magnesium, melatonin, mirabegron er, multiple vitamins-minerals, pravastatin, and triamcinolone cream. Also is allergic to doxazosin; labetalol; actos [pioglitazone hydrochloride]; amlodipine; cozaar [losartan potassium]; glipizide; imdur [isosorbide dinitrate]; and clindamycin hcl.  Renesme  has a past medical history of Acute on chronic respiratory failure with hypoxia ([place]) (11/27/2014), Anxiety, Chronic kidney disease (CKD), stage III (moderate) (HCC), CSA (central sleep apnea), Depression, Diabetes mellitus, GERD (gastroesophageal reflux disease), H/O breast biopsy (02/10/10), High cholesterol, Hyperlipidemia, Hypertension, Hyperthyroidism, LVH (left ventricular  hypertrophy), OSA (obstructive sleep apnea), Pneumonia (04/04), RBBB, Sarcoidosis of lung ([place]), Sleep apnea, and Thyroid disease. Also  has a past surgical history that includes Tonsillectomy; Appendectomy; Retroperitoneal lymph node excision; Elbow fracture surgery; Rhinoplasty; tubinates trimmed; laminectomy l5, s1; Carpal tunnel release; rt knee arthroscopy; Cholecystectomy; laminectomy c5-c6; and Abdominal hysterectomy (12/07).   Objective:   Vitals: BP (!) 186/90 (BP Location: Left Arm, Patient Position: Sitting, Cuff Size: Large)   Pulse 100   Temp 98.8 F (37.1 C) (Oral)   Resp 18   Ht 5' 4.8" (1.646 m)   Wt 266 lb 12.8 oz (121 kg)   LMP 07/01/2006   SpO2 98%   BMI 44.67 kg/m   Physical Exam  Constitutional: She is oriented to person, place, and time. She appears well-developed and well-nourished. She appears distressed (appears uncomfortable coughing and blowing nose during exam).  HENT:  Head: Normocephalic and atraumatic.  Right Ear: Tympanic membrane, external ear and ear canal normal.  Left Ear: Tympanic membrane, external ear and ear canal normal.  Nose: Mucosal edema and rhinorrhea present. Right sinus exhibits no maxillary sinus tenderness and no frontal sinus tenderness. Left sinus exhibits no maxillary sinus tenderness and no frontal sinus tenderness.  Mouth/Throat: Uvula is midline and mucous membranes are normal. Posterior oropharyngeal erythema present. Tonsils are 0 on the right. Tonsils are 0 on the left. No tonsillar exudate.  Eyes: Conjunctivae are normal.  Neck: Normal range of motion.  Cardiovascular: Normal rate, regular rhythm and normal heart sounds.  Pulmonary/Chest: Effort normal. She has no wheezes. She has no rhonchi. She has no rales.  Lymphadenopathy:       Head (right side): No submental, no submandibular, no tonsillar, no preauricular, no posterior auricular and no occipital adenopathy present.       Head (left side): No submental, no  submandibular, no tonsillar, no preauricular, no posterior auricular and no occipital adenopathy present.    She has no cervical adenopathy.       Right: No supraclavicular adenopathy present.       Left: No supraclavicular adenopathy present.  Neurological: She is alert and oriented to person, place, and time.  Skin: Skin is warm and dry.  Psychiatric: She has a normal mood and affect.  Vitals reviewed.  No results found for this or any previous visit (from the past 24 hour(s)).  Assessment and Plan :  1. Sinus pressure 2. Productive cough - HYDROcodone-homatropine (HYCODAN) 5-1.5 MG/5ML syrup; Take 5 mLs every 8 (eight) hours as needed by mouth for cough.  Dispense: 120 mL; Refill: 0 - benzonatate (TESSALON) 100 MG capsule; Take 1-2 capsules (100-200 mg total) 3 (three) times daily as needed by mouth for cough.  Dispense: 40 capsule; Refill: 0 - azithromycin (ZITHROMAX) 250 MG tablet; Take 2 tabs PO x 1 dose, then 1 tab PO QD x 4 days  Dispense: 6 tablet; Refill: 0 3. Nasal congestion - ipratropium (ATROVENT) 0.03 % nasal spray; Place 2 sprays 2 (two) times daily into both nostrils.  Dispense: 30 mL; Refill: 0 4. Acute upper respiratory infection Patient educated that this is likely viral etiology.  Her vitals are stable and she is afebrile.  Lungs are CTAB. I recommended symptomatic treatment.  Educated patient on potential side effects of Hycodan cough syrup. She notes she has had this multiple times in the past and has not had any issues with it.  I have given her a prescription for a Z-Pak to pick up if she is not feeling better in 3-5 days or if her symptoms start to worsen.  Strongly in her encouraged her to try the symptomatic treatment before she picks up the antibiotic. Return to clinic if symptoms worsen, do not improve, or as needed 5. Elevated blood pressure reading Asymptomatic. Likely due to illness and the fact that pt has not slept much due to cough. Instructed to check bp  outside of office over the next couple of weeks. Return if consistently >140/90. Given strict ED precautions.   Tenna Delaine, PA-C  [facility] 06/07/2017 10:22 AM

## 2017-06-08 ENCOUNTER — Telehealth: Payer: Self-pay | Admitting: Physician Assistant

## 2017-06-08 NOTE — Telephone Encounter (Signed)
Pt came in and picked up her trulicity at the [address]

## 2017-06-14 MED ORDER — PRAVASTATIN SODIUM 80 MG PO TABS: 80.0000 mg | ORAL_TABLET | Freq: Every morning | ORAL | 3 refills | Status: DC

## 2017-06-14 MED ORDER — LEVOTHYROXINE SODIUM 125 MCG PO TABS
125.0000 ug | ORAL_TABLET | Freq: Every day | ORAL | 1 refills | Status: DC
Start: 2017-06-14 — End: 2017-09-15

## 2017-06-14 MED ORDER — LABETALOL HCL 100 MG PO TABS: 100.0000 mg | ORAL_TABLET | Freq: Two times a day (BID) | ORAL | 1 refills | Status: DC

## 2017-06-14 NOTE — Addendum Note (Signed)
Addended by: Mancel Bale on: 06/14/2017 12:22 PM   Modules accepted: Orders

## 2017-06-16 ENCOUNTER — Other Ambulatory Visit: Payer: Self-pay | Admitting: Physician Assistant

## 2017-06-16 ENCOUNTER — Other Ambulatory Visit: Payer: Self-pay | Admitting: Family Medicine

## 2017-06-16 DIAGNOSIS — I10 Essential (primary) hypertension: Secondary | ICD-10-CM

## 2017-06-16 NOTE — Telephone Encounter (Signed)
Does pt need follow up appt before refill; last office visit 11/25/16

## 2017-06-16 NOTE — Telephone Encounter (Signed)
Last seen 11/25/16 for depression; does pt need follow up appt before meds can be refilled?

## 2017-07-04 ENCOUNTER — Encounter: Payer: Self-pay | Admitting: Physician Assistant

## 2017-07-07 NOTE — Telephone Encounter (Signed)
See patient's My Chart message. Call in the medication. You may authorize it under my name or PA Weber's name. The reply to patient in My Chart that it's been done.  Thank you!

## 2017-07-13 ENCOUNTER — Other Ambulatory Visit: Payer: Self-pay | Admitting: Family Medicine

## 2017-07-13 DIAGNOSIS — I10 Essential (primary) hypertension: Secondary | ICD-10-CM

## 2017-07-20 ENCOUNTER — Encounter: Payer: Self-pay | Admitting: Physician Assistant

## 2017-07-20 ENCOUNTER — Telehealth: Payer: Self-pay | Admitting: Physician Assistant

## 2017-07-20 NOTE — Telephone Encounter (Signed)
Please get the information I need to do this. Perhaps in a previous phone message, or there may be a form in Media.

## 2017-07-20 NOTE — Telephone Encounter (Signed)
Pt called to say she would be in to sign [employer] forms and contract after the first of the year.

## 2017-07-21 ENCOUNTER — Other Ambulatory Visit: Payer: Self-pay

## 2017-07-21 MED ORDER — MIRABEGRON ER 50 MG PO TB24: 50.0000 mg | ORAL_TABLET | Freq: Every day | ORAL | 1 refills | Status: DC

## 2017-07-21 NOTE — Telephone Encounter (Signed)
Please see note below. 

## 2017-08-04 ENCOUNTER — Other Ambulatory Visit: Payer: Self-pay | Admitting: Physician Assistant

## 2017-08-04 NOTE — Telephone Encounter (Signed)
Copied from [place] [id number]. Topic: Quick Communication - Rx Refill/Question >> Aug 04, 2017 10:25 AM Robina Ade, Helene Kelp D wrote: Has the patient contacted their pharmacy? Yes (Agent: If no, request that the patient contact the pharmacy for the refill.) Preferred Pharmacy (with phone number or street name): [facility] [place], [place] Agent: Please be advised that RX refills may take up to 3 business days. We ask that you follow-up with your pharmacy. Patient called and needs [facility] to refill her medication escitalopram (LEXAPRO) 20 MG tablet.

## 2017-08-04 NOTE — Telephone Encounter (Signed)
Patient called in requesting refill on Lexapro, last OV 11/25/16, the last refill was sent on 06/19/17, patient was called back at [phone], left VM to call the office to schedule and appointment before lexapro can be refilled.

## 2017-08-06 NOTE — Telephone Encounter (Signed)
Looks like this got lost someplace. Please follow-up.

## 2017-08-07 ENCOUNTER — Telehealth: Payer: Self-pay

## 2017-08-07 NOTE — Telephone Encounter (Signed)
Letter was written by Daphane Shepherd PA-C for patient to continue getting Mybetriq.  I printed and faxed letter to  [facility] at [phone].  LMVM advising patient that letter had been faxed.

## 2017-08-07 NOTE — Telephone Encounter (Signed)
Please print and send. If I need to sign it, please put in my box for tomorrow morning.

## 2017-08-09 NOTE — Telephone Encounter (Signed)
Sent to Jud[facility] Roch for review

## 2017-08-10 MED ORDER — ESCITALOPRAM OXALATE 20 MG PO TABS: 30.0000 mg | ORAL_TABLET | Freq: Every day | ORAL | 0 refills | Status: DC

## 2017-08-10 NOTE — Telephone Encounter (Signed)
Completed.

## 2017-08-10 NOTE — Telephone Encounter (Signed)
Meds ordered this encounter  Medications  . escitalopram (LEXAPRO) 20 MG tablet    Sig: Take 1.5 tablets (30 mg total) by mouth daily. Ov needed    Dispense:  45 tablet    Refill:  0

## 2017-08-25 ENCOUNTER — Telehealth: Payer: Self-pay | Admitting: Physician Assistant

## 2017-08-25 NOTE — Telephone Encounter (Signed)
LM for pt stating her papers were at the [address].

## 2017-08-25 NOTE — Telephone Encounter (Signed)
Pt needs to reapply for her patient assistance.  I have signed for her Mybetriq - she will need to pick up this form to send to the company.  Form at [facility] desk for her to call patient.

## 2017-09-12 ENCOUNTER — Ambulatory Visit (INDEPENDENT_AMBULATORY_CARE_PROVIDER_SITE_OTHER): Payer: Self-pay | Admitting: Physician Assistant

## 2017-09-12 ENCOUNTER — Other Ambulatory Visit: Payer: Self-pay

## 2017-09-12 ENCOUNTER — Encounter: Payer: Self-pay | Admitting: Physician Assistant

## 2017-09-12 VITALS — BP 140/78 | HR 82 | Temp 98.9°F | Resp 18 | Ht 64.8 in | Wt 262.6 lb

## 2017-09-12 DIAGNOSIS — E7800 Pure hypercholesterolemia, unspecified: Secondary | ICD-10-CM

## 2017-09-12 DIAGNOSIS — Z794 Long term (current) use of insulin: Secondary | ICD-10-CM

## 2017-09-12 DIAGNOSIS — N183 Chronic kidney disease, stage 3 unspecified: Secondary | ICD-10-CM

## 2017-09-12 DIAGNOSIS — Z9989 Dependence on other enabling machines and devices: Secondary | ICD-10-CM

## 2017-09-12 DIAGNOSIS — E118 Type 2 diabetes mellitus with unspecified complications: Secondary | ICD-10-CM

## 2017-09-12 DIAGNOSIS — E039 Hypothyroidism, unspecified: Secondary | ICD-10-CM

## 2017-09-12 DIAGNOSIS — I10 Essential (primary) hypertension: Secondary | ICD-10-CM

## 2017-09-12 DIAGNOSIS — G4733 Obstructive sleep apnea (adult) (pediatric): Secondary | ICD-10-CM

## 2017-09-12 MED ORDER — BENAZEPRIL HCL 20 MG PO TABS: ORAL_TABLET | ORAL | 1 refills | Status: DC

## 2017-09-12 MED ORDER — INDAPAMIDE 1.25 MG PO TABS: 1.2500 mg | ORAL_TABLET | Freq: Every day | ORAL | 1 refills | Status: DC

## 2017-09-12 MED ORDER — ESCITALOPRAM OXALATE 20 MG PO TABS: 30.0000 mg | ORAL_TABLET | Freq: Every day | ORAL | 0 refills | Status: DC

## 2017-09-12 MED ORDER — LABETALOL HCL 100 MG PO TABS: 100.0000 mg | ORAL_TABLET | Freq: Two times a day (BID) | ORAL | 1 refills | Status: DC

## 2017-09-12 NOTE — Progress Notes (Signed)
Rachel Kim  MRN: [id number] DOB: 1955-08-27  PCP: Mancel Bale, PA-C  Chief Complaint  Patient presents with  . Medication Refill    lotensin, lexapro ([facility]), lozol, and synthroid, prasvastatin   . Depression    screening was a 25     Subjective:  Pt presents to clinic for medication refills.  She is having a lot of money trouble.  She cannot work because of her somnolence even with nightly CPAP usage.  She is about to be evicted this week or at least the paperwork is going to be filed - she is not sure what she is going to do - she plans to talk to her church again and see if they can help.  She had food stamps so she is able to get food - she mainly shops the grocery store perimeter.  Would like to have bariatric surgery - plans to do [facility] - hoping to get medicaid and they cover it  Trying to get disability - working with a Chief Executive Officer - needs sleep specialist to help with that -- trying to get medicaid  History is obtained by patient.  Review of Systems  Constitutional: Negative for chills and fever.  Eyes: Negative for visual disturbance.  Respiratory: Negative for shortness of breath.   Cardiovascular: Negative for chest pain, palpitations and leg swelling.  Skin: Negative for wound.  Neurological: Negative for dizziness, light-headedness, numbness and headaches.    Patient Active Problem List   Diagnosis Date Noted  . Elevated cholesterol 09/12/2017  . Psoriasis 05/30/2017  . Overactive bladder 01/28/2015  . Nocturia 01/28/2015  . Headache 11/27/2014  . CKD (chronic kidney disease) stage 3, GFR 30-59 ml/min (HCC) 11/27/2014  . Dermatitis seborrheica 12/26/2012  . OSA on CPAP 12/19/2012  . Acne 07/04/2011  . Personal history of diseases of skin or subcutaneous tissue 07/04/2011  . Neurodermatitis 07/04/2011  . Hyperpigmentation of skin, postinflammatory 07/04/2011  . 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 Hypothyroidism 09/28/2006  . Controlled diabetes mellitus  type 2 with complications ([place]) 09/98/3382  . Mixed hyperlipidemia 09/28/2006  . Morbid obesity ([place]) 09/28/2006  . HYPERTENSION, BENIGN SYSTEMIC 09/28/2006    Current Outpatient Medications on File Prior to Visit  Medication Sig Dispense Refill  . acetaminophen (TYLENOL) 650 MG CR tablet Take 650 mg 2 (two) times daily by mouth.    Marland Kitchen aspirin 81 MG tablet Take 1 tablet (81 mg total) by mouth daily. 30 tablet 0  . Cholecalciferol (VITAMIN D) 2000 UNITS CAPS Take by mouth daily.      . Dulaglutide (TRULICITY) 1.5 NK/5.3ZJ SOPN Inject 1.5 mg into the skin once a week. 12 pen 3  . hydrALAZINE (APRESOLINE) 100 MG tablet Take 1 tablet (100 mg total) 3 (three) times daily by mouth. 270 tablet 3  . Insulin Glargine (TOUJEO SOLOSTAR) 300 UNIT/ML SOPN Inject 13 Units into the skin at bedtime.    Marland Kitchen levothyroxine (SYNTHROID, LEVOTHROID) 125 MCG tablet Take 1 tablet (125 mcg total) daily before breakfast by mouth. 90 tablet 1  . Magnesium 400 MG TABS Take 1 tablet by mouth 2 (two) times daily.     . Melatonin 1 MG TABS Take 2 mg at bedtime by mouth.    . mirabegron ER (MYRBETRIQ) 50 MG TB24 tablet Take 1 tablet (50 mg total) by mouth daily. 90 tablet 1  . Multiple Vitamins-Minerals (PRESERVISION AREDS 2 PO) Take 1 tablet by mouth 2 (two) times daily.    . pravastatin (PRAVACHOL) 80 MG tablet  Take 1 tablet (80 mg total) every morning by mouth. 90 tablet 3   No current facility-administered medications on file prior to visit.     Allergies  Allergen Reactions  . Doxazosin Swelling  . Labetalol Swelling  . Actos [Pioglitazone Hydrochloride] Swelling  . Amlodipine Swelling  . Cozaar [Losartan Potassium] Other (See Comments)    Causes drowsiness/zombie like  . Glipizide Swelling  . Imdur [Isosorbide Dinitrate] Other (See Comments)    Causes severe headaches  . Clindamycin Hcl Rash    Past Medical History:  Diagnosis Date  . Acute on chronic respiratory failure with hypoxia ([place]) 11/27/2014  .  Anxiety   . Chronic kidney disease (CKD), stage III (moderate) (HCC)   . CSA (central sleep apnea)   . Depression   . Diabetes mellitus   . GERD (gastroesophageal reflux disease)   . H/O breast biopsy 02/10/10   calcification , no tumors  . High cholesterol   . Hyperlipidemia   . Hypertension   . Hyperthyroidism    diagnosed 1989  . LVH (left ventricular hypertrophy)    echo 02/25/10 EF >55%  . OSA (obstructive sleep apnea)    severe complex apnea  . Pneumonia 04/04  . RBBB   . Sarcoidosis of lung ([place])   . Sleep apnea    uses CPAP  . Thyroid disease    hypothyroidism   Social History   Social History Narrative   Lives alone with 2 kitties.   Unemployed currently - she had a temporary job that ended in Dec 2018      Social History   Tobacco Use  . Smoking status: Never Smoker  . Smokeless tobacco: Never Used  Substance Use Topics  . Alcohol use: No  . Drug use: No   family history includes Colitis in her father; Colon cancer in her maternal grandmother; Hypertension in her mother; Stroke in her mother; Thyroid disease in her brother, brother, father, mother, sister, and sister.     Objective:  BP 140/78   Pulse 82   Temp 98.9 F (37.2 C) (Oral)   Resp 18   Ht 5' 4.8" (1.646 m)   Wt 262 lb 9.6 oz (119.1 kg)   LMP 07/01/2006   SpO2 94%   BMI 43.97 kg/m  Body mass index is 43.97 kg/m.  Physical Exam  Constitutional: She is oriented to person, place, and time and well-developed, well-nourished, and in no distress.  HENT:  Head: Normocephalic and atraumatic.  Right Ear: Hearing and external ear normal.  Left Ear: Hearing and external ear normal.  Eyes: Conjunctivae are normal.  Neck: Normal range of motion.  Cardiovascular: Normal rate, regular rhythm and normal heart sounds.  No murmur heard. Pulmonary/Chest: Effort normal and breath sounds normal.  Musculoskeletal:       Right lower leg: She exhibits no edema.       Left lower leg: She exhibits no  edema.  Neurological: She is alert and oriented to person, place, and time. Gait normal.  Skin: Skin is warm and dry.  Psychiatric: Mood, memory, affect and judgment normal.  Tearful at time when her situation is discussed but she is most of the time - not wanting to increase her depression medications - she feels like her mood decrease is mostly situational and she is working on her situation  Vitals reviewed.   Assessment and Plan :  Controlled type 2 diabetes mellitus with complication, with long-term current use of insulin ([place]) - Plan: CMP14+EGFR, Hemoglobin  A1c  OSA on CPAP  CKD (chronic kidney disease) stage 3, GFR 30-59 ml/min (HCC)  Hypothyroidism, unspecified type - Plan: TSH  HYPERTENSION, BENIGN SYSTEMIC - Plan: CMP14+EGFR  Elevated cholesterol - Plan: Lipid panel  Essential hypertension - Plan: benazepril (LOTENSIN) 20 MG tablet, labetalol (NORMODYNE) 100 MG tablet, indapamide (LOZOL) 1.25 MG tablet   Check labs - adjust medications if needed -- consider change from pravachol to crestor for better cholesterol control - I will contact cardiologist after he labs return for his thoughts on her   Pt is aware of her many health maintenance issues - she is unable to afford most of them but they have all been done in the past - she is due over a year for most of the but not more than 3 years for any of them.  Windell Hummingbird PA-C  [facility] 09/12/2017 3:55 PM

## 2017-09-12 NOTE — Patient Instructions (Addendum)
If you would like to look into financial assistance options for your healthcare, you can call the [facility] at [phone].  Talk to cardiologist -- about change from pravachol to crestor? See what he thinks about Fenofibrate to help with triglycerides --      IF you received an x-ray today, you will receive an invoice from [facility]. Please contact [facility] at [phone] with questions or concerns regarding your invoice.   IF you received labwork today, you will receive an invoice from [facility]. Please contact [facility] at [phone] with questions or concerns regarding your invoice.   Our billing staff will not be able to assist you with questions regarding bills from these companies.  You will be contacted with the lab results as soon as they are available. The fastest way to get your results is to activate your My Chart account. Instructions are located on the last page of this paperwork. If you have not heard from [place] regarding the results in 2 weeks, please contact this office.

## 2017-09-13 LAB — CMP14+EGFR
A/G RATIO: 1.8 (ref 1.2–2.2)
ALBUMIN: 4.4 g/dL (ref 3.6–4.8)
ALK PHOS: 93 IU/L (ref 39–117)
ALT: 14 IU/L (ref 0–32)
AST: 17 IU/L (ref 0–40)
BILIRUBIN TOTAL: 0.3 mg/dL (ref 0.0–1.2)
BUN / CREAT RATIO: 16 (ref 12–28)
BUN: 27 mg/dL (ref 8–27)
CHLORIDE: 102 mmol/L (ref 96–106)
CO2: 23 mmol/L (ref 20–29)
Calcium: 9.6 mg/dL (ref 8.7–10.3)
Creatinine, Ser: 1.74 mg/dL — ABNORMAL HIGH (ref 0.57–1.00)
GFR calc non Af Amer: 31 mL/min/{1.73_m2} — ABNORMAL LOW (ref >59)
GFR, EST AFRICAN AMERICAN: 36 mL/min/{1.73_m2} — AB (ref >59)
GLOBULIN, TOTAL: 2.4 g/dL (ref 1.5–4.5)
Glucose: 127 mg/dL — ABNORMAL HIGH (ref 65–99)
POTASSIUM: 4.2 mmol/L (ref 3.5–5.2)
SODIUM: 144 mmol/L (ref 134–144)
TOTAL PROTEIN: 6.8 g/dL (ref 6.0–8.5)

## 2017-09-13 LAB — LIPID PANEL
CHOL/HDL RATIO: 4.4 ratio (ref 0.0–4.4)
CHOLESTEROL TOTAL: 154 mg/dL (ref 100–199)
HDL: 35 mg/dL — ABNORMAL LOW (ref >39)
LDL Calculated: 85 mg/dL (ref 0–99)
Triglycerides: 171 mg/dL — ABNORMAL HIGH (ref 0–149)
VLDL CHOLESTEROL CAL: 34 mg/dL (ref 5–40)

## 2017-09-13 LAB — HEMOGLOBIN A1C
Est. average glucose Bld gHb Est-mCnc: 128 mg/dL
HEMOGLOBIN A1C: 6.1 % — AB (ref 4.8–5.6)

## 2017-09-13 LAB — TSH: TSH: 4.37 u[IU]/mL (ref 0.450–4.500)

## 2017-09-15 MED ORDER — LEVOTHYROXINE SODIUM 125 MCG PO TABS: 125.0000 ug | ORAL_TABLET | Freq: Every day | ORAL | 1 refills | Status: DC

## 2017-09-15 MED ORDER — PRAVASTATIN SODIUM 80 MG PO TABS: 80.0000 mg | ORAL_TABLET | Freq: Every morning | ORAL | 3 refills | Status: DC

## 2017-09-15 NOTE — Addendum Note (Signed)
Addended by: Mancel Bale on: 09/15/2017 06:08 PM   Modules accepted: Orders

## 2017-09-19 ENCOUNTER — Other Ambulatory Visit: Payer: Self-pay | Admitting: Physician Assistant

## 2017-09-19 ENCOUNTER — Encounter: Payer: Self-pay | Admitting: Cardiovascular Disease

## 2017-09-21 MED ORDER — ESCITALOPRAM OXALATE 20 MG PO TABS: 30.0000 mg | ORAL_TABLET | Freq: Every day | ORAL | 0 refills | Status: DC

## 2017-11-13 ENCOUNTER — Encounter: Payer: Self-pay | Admitting: Physician Assistant

## 2017-11-17 ENCOUNTER — Encounter: Payer: Self-pay | Admitting: Physician Assistant

## 2017-11-17 ENCOUNTER — Other Ambulatory Visit: Payer: Self-pay | Admitting: Physician Assistant

## 2017-11-21 ENCOUNTER — Telehealth: Payer: Self-pay | Admitting: Physician Assistant

## 2017-11-22 ENCOUNTER — Encounter: Payer: Self-pay | Admitting: Physician Assistant

## 2017-11-22 DIAGNOSIS — G4730 Sleep apnea, unspecified: Secondary | ICD-10-CM

## 2017-11-25 NOTE — Telephone Encounter (Signed)
Can we find that paperwork again and leave a copy for the patient - the fax did not go through according to the patient even though we got confirmation.  Thanks Judson Roch

## 2017-12-22 ENCOUNTER — Other Ambulatory Visit: Payer: Self-pay | Admitting: Physician Assistant

## 2018-01-16 ENCOUNTER — Ambulatory Visit: Payer: Self-pay | Admitting: Neurology

## 2018-01-16 ENCOUNTER — Encounter: Payer: Self-pay | Admitting: Neurology

## 2018-01-16 VITALS — BP 154/76 | HR 72 | Ht 65.0 in | Wt 289.0 lb

## 2018-01-16 DIAGNOSIS — Z9989 Dependence on other enabling machines and devices: Secondary | ICD-10-CM

## 2018-01-16 DIAGNOSIS — G4711 Idiopathic hypersomnia with long sleep time: Secondary | ICD-10-CM

## 2018-01-16 DIAGNOSIS — G4733 Obstructive sleep apnea (adult) (pediatric): Secondary | ICD-10-CM

## 2018-01-16 DIAGNOSIS — F341 Dysthymic disorder: Secondary | ICD-10-CM | POA: Insufficient documentation

## 2018-01-16 NOTE — Patient Instructions (Signed)
Hypersomnia Hypersomnia is when you feel extremely tired during the day even though you're getting plenty of sleep at night. You may need to take naps during the day, and you may also be extremely difficult to wake up when you are sleeping. What are the causes? The cause of your hypersomnia may not be known. Hypersomnia may be caused by:  Medicines.  Sleep disorders, such as narcolepsy.  Trauma or injury to your head or nervous system.  Using drugs or alcohol.  Tumors.  Medical conditions, such as depression or hypothyroidism.  Genetics.  What are the signs or symptoms? The main symptoms of hypersomnia include:  Feeling extremely tired throughout the day.  Being very difficult to wake up.  Sleeping for longer and longer periods.  Taking naps throughout the day.  Other symptoms may include:  Feeling: ? Restless. ? Annoyed. ? Anxious. ? Low energy.  Having difficulty: ? Remembering. ? Speaking. ? Thinking.  Losing your appetite.  Experiencing hallucinations.  How is this diagnosed? Hypersomnia may be diagnosed by:  Medical history and physical exam. This will include a sleep history.  Completing sleep logs.  Tests may also be done, such as: ? Polysomnography. ? Multiple sleep latency test (MSLT).  How is this treated? There is no cure for hypersomnia, but treatment can be very effective in helping manage the condition. Treatment may include:  Lifestyle and sleeping strategies to help cope with the condition.  Stimulant medicines.  Treating any underlying causes of hypersomnia.  Follow these instructions at home:  Take medicines only as directed by your health care provider.  Schedule short naps for when you feel sleepiest during the day. Tell your employer or teachers that you have hypersomnia. You may be able to adjust your schedule to include time for naps.  Avoid drinking alcohol or caffeinated beverages.  Do not eat a heavy meal before  bedtime. Eat at about the same times every day.  Do not drive or operate heavy machinery if you are sleepy.  Do not swim or go out on the water without a life jacket.  If possible, adjust your schedule so that you do not have to work or be active at night.  Keep all follow-up visits as directed by your health care provider. This is important. Contact a health care provider if:  You have new symptoms.  Your symptoms get worse. Get help right away if: You have serious thoughts of hurting yourself or someone else. This information is not intended to replace advice given to you by your health care provider. Make sure you discuss any questions you have with your health care provider. Document Released: 07/08/2002 Document Revised: 12/24/2015 Document Reviewed: 02/20/2014 Elsevier Interactive Patient Education  2018 [employer]  

## 2018-01-16 NOTE — Progress Notes (Signed)
SLEEP MEDICINE CLINIC   Provider:  Larey Seat, M D  Primary Care Physician:  Mittie Bodo   Referring Provider: Mancel Bale, PA-C    Chief Complaint  Patient presents with  . New Patient (Initial Visit)    pt alone. rm 10. pt states that she is here to get assistance with disability for paperwork. pt had a sleep study completed in 2011. she has been using CPAP since 2002 and brought her machine in. it is a old machine unable to card downl    HPI:  Rachel Kim is a 62 y.o. female , seen here as in a referral  from Rachel Kim for help with disability,  Rachel Kim is seen here on Tuesday the 01-16-2018, and brought her CPAP machine. I encountered the patient first in March 2011 when she was referred by her then psychiatrist Dr. Letta Kim.  The patient underwent a polysomnography that turned into a split-night after apnea was identified.  At the time she was 62 years of age she already had a complaint of severe daytime sleepiness and fatigue and had a high suspicion that she has obstructive sleep apnea.  She was diagnosed in 2002 with obstructive sleep apnea for the first time at an outside facility.  The Epworth Sleepiness Scale was endorsed at 14 points the Beck Depression Inventory at 22 points, her BMI at the time was 44 her neck size 16.24 inches.  She tested positive for a complex form of apnea with 6 obstructive apneas, 4 central apneas and 52 hypopneas resulting in an AHI of 56.4.  Supine sleep did not accentuate her sleep apnea very much, REM sleep was not seen-  Her  lowest oxygen saturation was 78% with 40 minutes of desaturation time. This is in relation to a total sleep time of 66 minutes. She needed CPAP.  The patient was titrated to CPAP but developed some central apneas.  We consider this a partially successful titration her final pressure setting was 9 cmH2O.  She return for a full night CPAP titration on 19 November 2009 this time her AHI was reduced to  5/h under 12 cmH2O pressure with CPR.  My colleague Lonzo Cloud interpreted the study at the time.  The patient continues to use her machine nightly but her memory chip did not give [place] data for the last 12 months.  The last data I have available are the use of time in November 2017 when to use the machine 100% for the last 30 days, 100% of the time over 4 hours with an average use at time of 6 hours 9 minutes average AHI is 4.5, current CPAP pressure is 14 cmH2O, no EPR is set, I hope that we have a chance to get more recent data through [facility], her durable medical equipment company. She has not gotten supplies in 3 years !!!she had lost insurance.  [name] basically describes her last jobs as unsuccessfully. She was approved for FMLA in 2015 for hypersomnia- but reportedly was bullied at the job- she resigned.  She worked in a company as a Chartered certified accountant in 2017 , suffering from [place], sleepiness and fatigue, felt sick frequently and missed 30% of the work week. She lost that job.    She worked again as a Chartered certified accountant in 2018, from home. She would sleep hours and days , reportedly not getting in trouble with her assignments. She was asked to work more, but felt she  couldn't. It was a temp job, self limited.      She was last seen by primary care 09-18-2017, see quote :  "[name], MRN: [id number], DOB: July 15, 1956  PCP: Mancel Bale, PA-C  Chief Complaint  Patient presents with  . Medication Refill    lotensin, lexapro ([facility]), lozol, and synthroid, prasvastatin   . Depression    screening was a 25     Subjective:  Pt presents to clinic for medication refills.  She is having a lot of money trouble.  She cannot work because of her somnolence even with nightly CPAP usage.  She is about to be evicted this week or at least the paperwork is going to be filed - she is not sure what she is going to do - she plans to talk to her church again and see if they can help.  She  had food stamps so she is able to get food - she mainly shops the grocery store perimeter. Would like to have bariatric surgery - plans to do [facility] - hoping to get medicaid and they cover it Trying to get disability - working with a Chief Executive Officer - needs sleep specialist to help with that -- trying to get medicaid History is obtained by patient" end of quote .    Chief complaint according to patient : my hypersomnolence and depression/ anxiety are related and I cannot find gainful employment, cannot hold a job" I have hypothyroidism and HTN".    Sleep habits are as follows: She doesn't see a a pattern or rhythm to her sleep disorder, sleeps too much and some days not all.  She preps for bed time, around 11 Pm- no TV in bedroom. Light are dim, electronics are off, no clock to be seen.  it's cool, quiet and dark in her bedroom,  She sleeps alone- always using CPAP. sometimes one of 2 cats will be in bed with her. She sleeps supine -  due to shoulder arthritis. She may sleep for 6 hours uninterrupted, may have one nocturia ( on myrbetriq) . She sleep a total between 8-12 hours. She sleeps at least 3 more hours. She dreams vividly, crazy, wild and lucid. Stressful , frantic activity in he dreams- running, screaming.  She needs up to 6 alarms to finally get up in AM. No Sleep paralysis, but REM pressure.  Naps in daytime are at least 3 hours long. Power naps are not working- not refreshing.    Sleep medical history and family sleep history: OSA, hypothyroidism, obesity, depression, anxiety, history of anemia.   Social history: no caffeine, no ETOH , no smoking. Lives alone, unemployed, has a sister here in town, sings in a choir.    Review of Systems: Out of a complete 14 system review, the patient complains of only the following symptoms, and all other reviewed systems are negative.  snoring when not using CPAP, weight gain, sleepiness - EDS, tardiness  How likely are you to doze in the following  situations: 0 = not likely, 1 = slight chance, 2 = moderate chance, 3 = high chance  Sitting and Reading? 3 Watching Television? 3 Sitting inactive in a public place (theater or meeting)? 3 Lying down in the afternoon when circumstances permit? 1 As a passenger in a car for an hour without a break? 3 Sitting and talking to someone? 0 Sitting quietly after lunch without alcohol? 2 In a car, while stopped for a few minutes in traffic? 0  Total =  15/ 24   Fatigue severity score 63  Maximum score,  depression score PHQ 9 14 points.    Social History   Socioeconomic History  . Marital status: Single    Spouse name: n/a  . Number of children: 0  . Years of education: Master's  . Highest education level: Not on file  Occupational History  . Occupation: Corporate investment banker  Social Needs  . Financial resource strain: Not on file  . Food insecurity:    Worry: Not on file    Inability: Not on file  . Transportation needs:    Medical: Not on file    Non-medical: Not on file  Tobacco Use  . Smoking status: Never Smoker  . Smokeless tobacco: Never Used  Substance and Sexual Activity  . Alcohol use: No  . Drug use: No  . Sexual activity: Never    Partners: Male    Birth control/protection: Surgical    Comment: hysterectomy  Lifestyle  . Physical activity:    Days per week: Not on file    Minutes per session: Not on file  . Stress: Not on file  Relationships  . Social connections:    Talks on phone: Not on file    Gets together: Not on file    Attends religious service: Not on file    Active member of club or organization: Not on file    Attends meetings of clubs or organizations: Not on file    Relationship status: Not on file  . Intimate partner violence:    Fear of current or ex partner: Not on file    Emotionally abused: Not on file    Physically abused: Not on file    Forced sexual activity: Not on file  Other Topics Concern  . Not on file  Social History Narrative    Lives alone with 2 kitties.   Unemployed currently - she had a temporary job that ended in Dec 2018       Family History  Problem Relation Age of Onset  . Colitis Father   . Thyroid disease Father   . Colon cancer Maternal Grandmother   . Hypertension Mother   . Stroke Mother   . Thyroid disease Mother   . Thyroid disease Sister   . Thyroid disease Brother   . Thyroid disease Sister   . Thyroid disease Brother     Past Medical History:  Diagnosis Date  . Acute on chronic respiratory failure with hypoxia ([place]) 11/27/2014  . Anxiety   . Chronic kidney disease (CKD), stage III (moderate) (H[facility])   . CSA (central sleep apnea)   . Depression   . Diabetes mellitus   . GERD (gastroesophageal reflux disease)   . H/O breast biopsy 02/10/10   calcification , no tumors  . High cholesterol   . Hyperlipidemia   . Hypertension   . Hyperthyroidism    diagnosed 1989  . LVH (left ventricular hypertrophy)    echo 02/25/10 EF >55%  . OSA (obstructive sleep apnea)    severe complex apnea  . Pneumonia 04/04  . RBBB   . Sarcoidosis of lung ([place])   . Sleep apnea    uses CPAP  . Thyroid disease    hypothyroidism    Past Surgical History:  Procedure Laterality Date  . ABDOMINAL HYSTERECTOMY  12/07  . APPENDECTOMY    . CARPAL TUNNEL RELEASE     right and left  . CHOLECYSTECTOMY    . ELBOW FRACTURE SURGERY  with pins and pins removed  . laminectomy c5-c6    . laminectomy l5, s1    . RETROPERITONEAL LYMPH NODE EXCISION     right underarm  . RHINOPLASTY     has had several revisions  . rt knee arthroscopy    . TONSILLECTOMY    . tubinates trimmed      Current Outpatient Medications  Medication Sig Dispense Refill  . acetaminophen (TYLENOL) 650 MG CR tablet Take 650 mg 2 (two) times daily by mouth.    Marland Kitchen aspirin 81 MG tablet Take 1 tablet (81 mg total) by mouth daily. 30 tablet 0  . benazepril (LOTENSIN) 20 MG tablet TAKE 1 TABLET BY MOUTH ONCE DAILY 90 tablet 1  .  Cholecalciferol (VITAMIN D) 2000 UNITS CAPS Take by mouth daily.      . Dulaglutide (TRULICITY) 1.5 XB/2.8UX SOPN Inject 1.5 mg into the skin once a week. 12 pen 3  . escitalopram (LEXAPRO) 20 MG tablet TAKE 1&1/2 TABLETS BY MOUTH ONCE A DAY *NEEDS OFFICE VISIT* 45 tablet 0  . hydrALAZINE (APRESOLINE) 100 MG tablet Take 1 tablet (100 mg total) 3 (three) times daily by mouth. 270 tablet 3  . indapamide (LOZOL) 1.25 MG tablet Take 1 tablet (1.25 mg total) by mouth daily. 90 tablet 1  . Insulin Glargine (TOUJEO SOLOSTAR) 300 UNIT/ML SOPN Inject 13 Units into the skin at bedtime.    Marland Kitchen labetalol (NORMODYNE) 100 MG tablet Take 1 tablet (100 mg total) by mouth 2 (two) times daily. 180 tablet 1  . levothyroxine (SYNTHROID, LEVOTHROID) 125 MCG tablet Take 1 tablet (125 mcg total) by mouth daily before breakfast. 90 tablet 1  . MAGNESIUM CITRATE PO Take 150 mg by mouth daily.    . Melatonin 1 MG TABS Take 0.5-1 mg by mouth at bedtime.     . mirabegron ER (MYRBETRIQ) 50 MG TB24 tablet Take 1 tablet (50 mg total) by mouth daily. 90 tablet 1  . Multiple Vitamins-Minerals (PRESERVISION AREDS 2 PO) Take 1 tablet by mouth 2 (two) times daily.    . pravastatin (PRAVACHOL) 80 MG tablet Take 1 tablet (80 mg total) by mouth every morning. 90 tablet 3   No current facility-administered medications for this visit.     Allergies as of 01/16/2018 - Review Complete 09/12/2017  Allergen Reaction Noted  . Doxazosin Swelling 11/25/2014  . Labetalol Swelling 11/25/2014  . Actos [pioglitazone hydrochloride] Swelling 01/31/2011  . Amlodipine Swelling 10/09/2012  . Cozaar [losartan potassium] Other (See Comments) 11/25/2014  . Glipizide Swelling 01/31/2011  . Imdur [isosorbide dinitrate] Other (See Comments) 12/08/2014  . Clindamycin hcl Rash 01/31/2011    Vitals: BP (!) 154/76   Pulse 72   Ht 5\' 5"  (1.651 m)   Wt 289 lb (131.1 kg)   LMP 07/01/2006   BMI 48.09 kg/m  Last Weight:  Wt Readings from Last 1  Encounters:  01/16/18 289 lb (131.1 kg)   LKG:MWNU mass index is 48.09 kg/m.     Last Height:   Ht Readings from Last 1 Encounters:  01/16/18 5\' 5"  (1.651 m)    Physical exam:  General: The patient is awake, alert and appears not in acute distress. The patient is well groomed. Head: Normocephalic, atraumatic. Neck is supple. Mallampati 3-4,  neck circumference:17. 5' .  Nasal airflow congested , deviated septum - restricted on the right. , Retrognathia is not seen. - she is status post rhinoplasty !  Cardiovascular:  Regular rate and rhythm  without  murmurs  or carotid bruit, and without distended neck veins. Respiratory: Lungs are clear to auscultation. Skin:  Without evidence of edema, or rash Trunk: BMI is 48. The patient's posture is erect.     Neurologic exam : The patient is awake and alert, oriented to place and time.   Memory subjective described as intact.  Attention span & concentration ability appears normal here but is reportedly affected by fatigue and sleepiness. Marland Kitchen  Speech is fluent,  without dysarthria, but there is nasal dysphonia.  Mood and affect are appropriate.   Cranial nerves: Pupils are equal and briskly reactive to light. Funduscopic exam without evidence of pallor or edema. Extraocular movements  in vertical and horizontal planes intact and without nystagmus. Visual fields by finger perimetry are intact. Hearing to finger rub impaired, tinnitus reported.  Facial sensation intact to fine touch. Facial motor strength is symmetric and tongue and uvula move midline. Shoulder shrug was symmetrical.  Motor exam:   Normal tone, muscle bulk and symmetric strength in all extremities. Sensory: . Proprioception tested in the upper extremities was normal. Coordination: Rapid alternating movements in the fingers/hands was normal. Finger-to-nose maneuver  normal without evidence of ataxia, dysmetria or tremor. Gait and station: Patient walks without assistive device   .Tandem gait is unfragmented. Turns with 3 Steps.  Deep tendon reflexes: in the  upper and lower extremities are symmetric and intact.   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 Even higher fatigue score.   2) Apnea - EDS is abnormal-  But cyclic hypersomnia- but the cause should not be sleep apnea based on her CPAP download data. She was highly compliant and had an AHI around 5 or less. 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   3) Co-morbidities of CKD grade 3. Morbid obesity, hypo thyroidsim, HTN, sarcoidosis , DM, anxiety and depression, chronic pain- all can contribute to fatigue and sleepiness.   4) medication side effects of sleepiness;  antihypertensives all make her feel more sluggish.   The patient was advised of the nature of the diagnosed disorder , the treatment options and the  risks for general health and wellness arising from not treating the condition. 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  Organic reasons- narcolepsy testing was negative in 2002-2003 at [facility] .    I spent more than 50 minutes of face to face time with the patient.  Greater than 50% of time was spent in counseling and coordination of care. We have discussed the diagnosis and differential and I answered the patient's questions.    Plan:  Treatment plan and additional workup :  New supplies for CPAP to eliminate face mask air leak. She is using a mirage nasal mask medium, needs urgent replacement - I gave her a ESON mask with headgear.  Get more recent compliance data. Sleep diary , helps to see rhythm or periodicity .  I see no way to provide disability for a hypersomnia  sleep disorder- not enough organic abnormalities while on CPAP,   Larey Seat, MD 08/28/7865, 6:72 PM  Certified in Neurology by ABPN Certified in [place] by [facility] [address], [place], [place] [address]

## 2018-01-24 ENCOUNTER — Encounter: Payer: Self-pay | Admitting: Physician Assistant

## 2018-02-02 ENCOUNTER — Other Ambulatory Visit: Payer: Self-pay | Admitting: Physician Assistant

## 2018-02-07 ENCOUNTER — Telehealth: Payer: Self-pay

## 2018-02-07 NOTE — Telephone Encounter (Signed)
LMOVM for pt per [facility] 5 boxes Trulicity in drug refrig at 102 for pt pick up

## 2018-03-13 ENCOUNTER — Telehealth: Payer: Self-pay | Admitting: Physician Assistant

## 2018-03-13 MED ORDER — ESCITALOPRAM OXALATE 20 MG PO TABS: 30.0000 mg | ORAL_TABLET | Freq: Every day | ORAL | 0 refills | Status: DC

## 2018-03-13 NOTE — Telephone Encounter (Signed)
Copied from [place] [id number]. Topic: Inquiry >> Mar 13, 2018 12:55 PM Pricilla Handler wrote: Reason for CRM: Lattie Haw with [facility] [phone]) called requesting a refill of Escitalopram (LEXAPRO) 20 MG tablet on behalf of the patient. Patient's preferred pharmacy is [facility], [place] [phone] (Phone)  [phone] (Fax).       Thank You!!!

## 2018-03-13 NOTE — Telephone Encounter (Signed)
Copied from [place] [id number]. Topic: Inquiry >> Mar 13, 2018 12:55 PM Pricilla Handler wrote: Reason for CRM: Lattie Haw with [facility] [phone]) called requesting a refill of Escitalopram (LEXAPRO) 20 MG tablet on behalf of the patient. Patient's preferred pharmacy is [facility], [place] [phone] (Phone)  [phone] (Fax).       Thank You!!!

## 2018-03-13 NOTE — Telephone Encounter (Signed)
Incoming call from patient requesting refill on Lexapro 20 mg.    lexapro 20mg  refill Last Refill:03/13/18 # 45 Last OV: 01/16/18 PCP: [facility]/ [address]

## 2018-04-11 ENCOUNTER — Other Ambulatory Visit: Payer: Self-pay | Admitting: Physician Assistant

## 2018-04-11 NOTE — Telephone Encounter (Signed)
Rx refill request: escitalopram 20 mg          Last filled: 03/13/18 #45   (Patient has not had 6 month follow up)  LOV: 09/12/17  PCP: [facility]  Pharmacy: verified

## 2018-06-27 DIAGNOSIS — Z0289 Encounter for other administrative examinations: Secondary | ICD-10-CM

## 2018-07-10 ENCOUNTER — Telehealth: Payer: Self-pay | Admitting: Neurology

## 2018-07-10 NOTE — Telephone Encounter (Signed)
Completed Medical Source Statements faxed to [facility]ices upon pt's request, confirmation received

## 2018-12-03 ENCOUNTER — Telehealth: Payer: Self-pay

## 2018-12-03 NOTE — Telephone Encounter (Signed)
Pt is now a pt at [facility] in [place], faxed [facility] forms to that office. Also faxed [place] [facility] to make them aware of the change

## 2019-01-08 NOTE — Telephone Encounter (Signed)
done

## 2019-06-11 ENCOUNTER — Other Ambulatory Visit: Payer: Self-pay

## 2019-06-11 ENCOUNTER — Encounter: Payer: Self-pay | Admitting: Family Medicine

## 2019-06-11 ENCOUNTER — Ambulatory Visit: Payer: Medicaid Other | Admitting: Family Medicine

## 2019-06-11 ENCOUNTER — Other Ambulatory Visit: Payer: Self-pay | Admitting: Neurology

## 2019-06-11 VITALS — BP 135/67 | HR 68 | Temp 97.4°F | Ht 65.0 in | Wt 253.4 lb

## 2019-06-11 DIAGNOSIS — G4733 Obstructive sleep apnea (adult) (pediatric): Secondary | ICD-10-CM | POA: Diagnosis not present

## 2019-06-11 DIAGNOSIS — G4711 Idiopathic hypersomnia with long sleep time: Secondary | ICD-10-CM | POA: Diagnosis not present

## 2019-06-11 DIAGNOSIS — Z9989 Dependence on other enabling machines and devices: Secondary | ICD-10-CM

## 2019-06-11 NOTE — Patient Instructions (Signed)
We will order a new sleep study  Follow up pending new CPAP machine as directed  Sleep Apnea Sleep apnea affects breathing during sleep. It causes breathing to stop for a short time or to become shallow. It can also increase the risk of:  Heart attack.  Stroke.  Being very overweight (obese).  Diabetes.  Heart failure.  Irregular heartbeat. The goal of treatment is to help you breathe normally again. What are the causes? There are three kinds of sleep apnea:  Obstructive sleep apnea. This is caused by a blocked or collapsed airway.  Central sleep apnea. This happens when the brain does not send the right signals to the muscles that control breathing.  Mixed sleep apnea. This is a combination of obstructive and central sleep apnea. The most common cause of this condition is a collapsed or blocked airway. This can happen if:  Your throat muscles are too relaxed.  Your tongue and tonsils are too large.  You are overweight.  Your airway is too small. What increases the risk?  Being overweight.  Smoking.  Having a small airway.  Being older.  Being female.  Drinking alcohol.  Taking medicines to calm yourself (sedatives or tranquilizers).  Having family members with the condition. What are the signs or symptoms?  Trouble staying asleep.  Being sleepy or tired during the day.  Getting angry a lot.  Loud snoring.  Headaches in the morning.  Not being able to focus your mind (concentrate).  Forgetting things.  Less interest in sex.  Mood swings.  Personality changes.  Feelings of sadness (depression).  Waking up a lot during the night to pee (urinate).  Dry mouth.  Sore throat. How is this diagnosed?  Your medical history.  A physical exam.  A test that is done when you are sleeping (sleep study). The test is most often done in a sleep lab but may also be done at home. How is this treated?   Sleeping on your side.  Using a medicine  to get rid of mucus in your nose (decongestant).  Avoiding the use of alcohol, medicines to help you relax, or certain pain medicines (narcotics).  Losing weight, if needed.  Changing your diet.  Not smoking.  Using a machine to open your airway while you sleep, such as: ? An oral appliance. This is a mouthpiece that shifts your lower jaw forward. ? A CPAP device. This device blows air through a mask when you breathe out (exhale). ? An EPAP device. This has valves that you put in each nostril. ? A BPAP device. This device blows air through a mask when you breathe in (inhale) and breathe out.  Having surgery if other treatments do not work. It is important to get treatment for sleep apnea. Without treatment, it can lead to:  High blood pressure.  Coronary artery disease.  In men, not being able to have an erection (impotence).  Reduced thinking ability. Follow these instructions at home: Lifestyle  Make changes that your doctor recommends.  Eat a healthy diet.  Lose weight if needed.  Avoid alcohol, medicines to help you relax, and some pain medicines.  Do not use any products that contain nicotine or tobacco, such as cigarettes, e-cigarettes, and chewing tobacco. If you need help quitting, ask your doctor. General instructions  Take over-the-counter and prescription medicines only as told by your doctor.  If you were given a machine to use while you sleep, use it only as told by your doctor.  If you are having surgery, make sure to tell your doctor you have sleep apnea. You may need to bring your device with you.  Keep all follow-up visits as told by your doctor. This is important. Contact a doctor if:  The machine that you were given to use during sleep bothers you or does not seem to be working.  You do not get better.  You get worse. Get help right away if:  Your chest hurts.  You have trouble breathing in enough air.  You have an uncomfortable feeling  in your back, arms, or stomach.  You have trouble talking.  One side of your body feels weak.  A part of your face is hanging down. These symptoms may be an emergency. Do not wait to see if the symptoms will go away. Get medical help right away. Call your local emergency services (911 in the [place] Do not drive yourself to the hospital. Summary  This condition affects breathing during sleep.  The most common cause is a collapsed or blocked airway.  The goal of treatment is to help you breathe normally while you sleep. This information is not intended to replace advice given to you by your health care provider. Make sure you discuss any questions you have with your health care provider. Document Released: 04/26/2008 Document Revised: 05/04/2018 Document Reviewed: 03/13/2018 Elsevier Patient Education  2020 [employer].

## 2019-06-11 NOTE — Progress Notes (Signed)
PATIENT: Rachel Kim DOB: November 20, 1955  REASON FOR VISIT: follow up HISTORY FROM: patient  Chief Complaint  Patient presents with  . Follow-up    Yearly f/u. Alone. Rm 2. Patient stated that her machine has been acting up due to it being very old. She stated she would like Medicaid to get her a new machine if  possible     HISTORY OF PRESENT ILLNESS: Today 06/11/19 Rachel Kim is a 63 y.o. female here today for follow up for OSA on CPAP.  She reports that she is using CPAP every night, however, she has had difficulty with her machine "acting up".  She is currently homeless.  She reports that she is sleeping at a friend's house at the time being.  She has lost some of her supplies.  The machine works at times and other times it does not.  She is now insured with Medicaid and is interested in a new CPAP machine.  Her current machine is from 2011.  She did have a sleep study here with [place] last in 2011.  There is no compliance data to review at today's visit.  She is adamant that she uses CPAP nightly.  She endorses significant daytime sleepiness despite compliance with CPAP therapy.  She is always tired.  Some days she does not sleep at all and others she sleeps all day.   HISTORY: (copied from Dr Dohmeier's note on 01/16/2018)  HPI:  Rachel Kim is a 63 y.o. female , seen here as in a referral  from [place] for help with disability,  Alexandre Landrum is seen here on Tuesday the 01-16-2018, and brought her CPAP machine. I encountered the patient first in March 2011 when she was referred by her then psychiatrist Dr. Letta Moynahan.  The patient underwent a polysomnography that turned into a split-night after apnea was identified.  At the time she was 63 years of age she already had a complaint of severe daytime sleepiness and fatigue and had a high suspicion that she has obstructive sleep apnea.  She was diagnosed in 2002 with obstructive sleep apnea for the first time at an outside  facility.  The Epworth Sleepiness Scale was endorsed at 14 points the Beck Depression Inventory at 22 points, her BMI at the time was 44 her neck size 16.24 inches.  She tested positive for a complex form of apnea with 6 obstructive apneas, 4 central apneas and 52 hypopneas resulting in an AHI of 56.4.  Supine sleep did not accentuate her sleep apnea very much, REM sleep was not seen-  Her  lowest oxygen saturation was 78% with 40 minutes of desaturation time. This is in relation to a total sleep time of 66 minutes. She needed CPAP.  The patient was titrated to CPAP but developed some central apneas.  We consider this a partially successful titration her final pressure setting was 9 cmH2O.  She return for a full night CPAP titration on 19 November 2009 this time her AHI was reduced to 5/h under 12 cmH2O pressure with CPR.  My colleague Lonzo Cloud interpreted the study at the time.  The patient continues to use her machine nightly but her memory chip did not give [place] data for the last 12 months.  The last data I have available are the use of time in November 2017 when to use the machine 100% for the last 30 days, 100% of the time over 4 hours with an average use  at time of 6 hours 9 minutes average AHI is 4.5, current CPAP pressure is 14 cmH2O, no EPR is set, I hope that we have a chance to get more recent data through [facility], her durable medical equipment company. She has not gotten supplies in 3 years !!!she had lost insurance.  Mrs. Allgood basically describes her last jobs as unsuccessfully. She was approved for FMLA in 2015 for hypersomnia- but reportedly was bullied at the job- she resigned.  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 She lost that job.    She worked again as a Chartered certified accountant in 2018, from home. She would sleep hours and days , reportedly not getting in trouble  with her assignments. She was asked to work more, but felt she couldn't. It was a temp job, self limited.    REVIEW OF SYSTEMS: Out of a complete 14 system review of symptoms, the patient complains only of the following symptoms, memory loss, restless legs, insomnia, apnea, daytime sleepiness, decreased concentration, depression, anxiety and all other reviewed systems are negative.  Epworth sleepiness scale: 18 Fatigue severity scale: 63  ALLERGIES: Allergies  Allergen Reactions  . Doxazosin Swelling  . Labetalol Swelling  . Actos [Pioglitazone Hydrochloride] Swelling  . Amlodipine Swelling  . Cozaar [Losartan Potassium] Other (See Comments)    Causes drowsiness/zombie like  . Glipizide Swelling  . Imdur [Isosorbide Dinitrate] Other (See Comments)    Causes severe headaches  . Clindamycin Hcl Rash    HOME MEDICATIONS: Outpatient Medications Prior to Visit  Medication Sig Dispense Refill  . acetaminophen (TYLENOL) 650 MG CR tablet Take 650 mg 2 (two) times daily by mouth.    . Adalimumab 40 MG/0.4ML PNKT Inject 40 mg into the skin every 14 (fourteen) days.    Marland Kitchen aspirin 81 MG tablet Take 1 tablet (81 mg total) by mouth daily. 30 tablet 0  . benazepril (LOTENSIN) 20 MG tablet TAKE 1 TABLET BY MOUTH ONCE DAILY 90 tablet 1  . Cholecalciferol (VITAMIN D) 2000 UNITS CAPS Take by mouth daily.      . clobetasol (TEMOVATE) 0.05 % external solution Apply 1 application topically as needed.    Marland Kitchen escitalopram (LEXAPRO) 20 MG tablet TAKE ONE AND ONE-HALF (1 AND1/2) TABLET BY MOUTH DAILY 45 tablet 0  . hydrALAZINE (APRESOLINE) 100 MG tablet Take 1 tablet (100 mg total) 3 (three) times daily by mouth. 270 tablet 3  . indapamide (LOZOL) 1.25 MG tablet Take 1 tablet (1.25 mg total) by mouth daily. 90 tablet 1  . labetalol (NORMODYNE) 100 MG tablet Take 1 tablet (100 mg total) by mouth 2 (two) times daily. 180 tablet 1  . levothyroxine (SYNTHROID, LEVOTHROID) 125 MCG tablet Take 1 tablet (125 mcg  total) by mouth daily before breakfast. 90 tablet 1  . MAGNESIUM CITRATE PO Take 150 mg by mouth daily.    . Melatonin 1 MG TABS Take 0.5-1 mg by mouth at bedtime.     . Multiple Vitamins-Minerals (PRESERVISION AREDS 2 PO) Take 1 tablet by mouth 2 (two) times daily.    Vladimir Faster Glycol-Propyl Glycol (SYSTANE) 0.4-0.3 % SOLN Place 2 drops into both eyes 2 (two) times daily.    . polyethylene glycol (MIRALAX / GLYCOLAX) 17 g packet Take 17 g by mouth as needed.    . pravastatin (PRAVACHOL) 80 MG tablet Take 1 tablet (80 mg total)  by mouth every morning. 90 tablet 3  . sulfaSALAzine (AZULFIDINE) 500 MG tablet Take by mouth. 500 mg daily and 1000 mg at bedtime    . Dulaglutide (TRULICITY) 1.5 0000000 SOPN Inject 1.5 mg into the skin once a week. 12 pen 3  . Insulin Glargine (TOUJEO SOLOSTAR) 300 UNIT/ML SOPN Inject 13 Units into the skin at bedtime.    . mirabegron ER (MYRBETRIQ) 50 MG TB24 tablet Take 1 tablet (50 mg total) by mouth daily. 90 tablet 1   No facility-administered medications prior to visit.     PAST MEDICAL HISTORY: Past Medical History:  Diagnosis Date  . Acute on chronic respiratory failure with hypoxia ([place]) 11/27/2014  . Anxiety   . Chronic kidney disease (CKD), stage III (moderate)   . CSA (central sleep apnea)   . Depression   . Diabetes mellitus   . GERD (gastroesophageal reflux disease)   . H/O breast biopsy 02/10/10   calcification , no tumors  . High cholesterol   . Hyperlipidemia   . Hypertension   . Hyperthyroidism    diagnosed 1989  . LVH (left ventricular hypertrophy)    echo 02/25/10 EF >55%  . OSA (obstructive sleep apnea)    severe complex apnea  . Pneumonia 04/04  . RBBB   . Sarcoidosis of lung ([place])   . Sleep apnea    uses CPAP  . Thyroid disease    hypothyroidism    PAST SURGICAL HISTORY: Past Surgical History:  Procedure Laterality Date  . ABDOMINAL HYSTERECTOMY  12/07  . APPENDECTOMY    . CARPAL TUNNEL RELEASE     right and left   . CHOLECYSTECTOMY    . ELBOW FRACTURE SURGERY     with pins and pins removed  . laminectomy c5-c6    . laminectomy l5, s1    . RETROPERITONEAL LYMPH NODE EXCISION     right underarm  . RHINOPLASTY     has had several revisions  . rt knee arthroscopy    . TONSILLECTOMY    . tubinates trimmed      FAMILY HISTORY: Family History  Problem Relation Age of Onset  . Colitis Father   . Thyroid disease Father   . Colon cancer Maternal Grandmother   . Hypertension Mother   . Stroke Mother   . Thyroid disease Mother   . Thyroid disease Sister   . Thyroid disease Brother   . Thyroid disease Sister   . Thyroid disease Brother     SOCIAL HISTORY: Social History   Socioeconomic History  . Marital status: Single    Spouse name: n/a  . Number of children: 0  . Years of education: Master's  . Highest education level: Not on file  Occupational History  . Occupation: Corporate investment banker  Social Needs  . Financial resource strain: Not on file  . Food insecurity    Worry: Not on file    Inability: Not on file  . Transportation needs    Medical: Not on file    Non-medical: Not on file  Tobacco Use  . Smoking status: Never Smoker  . Smokeless tobacco: Never Used  Substance and Sexual Activity  . Alcohol use: No  . Drug use: No  . Sexual activity: Never    Partners: Male    Birth control/protection: Surgical    Comment: hysterectomy  Lifestyle  . Physical activity    Days per week: Not on file    Minutes per session: Not on file  .  Stress: Not on file  Relationships  . Social Herbalist on phone: Not on file    Gets together: Not on file    Attends religious service: Not on file    Active member of club or organization: Not on file    Attends meetings of clubs or organizations: Not on file    Relationship status: Not on file  . Intimate partner violence    Fear of current or ex partner: Not on file    Emotionally abused: Not on file    Physically abused: Not  on file    Forced sexual activity: Not on file  Other Topics Concern  . Not on file  Social History Narrative   Lives alone with 2 kitties.   Unemployed currently - she had a temporary job that ended in Dec 2018         PHYSICAL EXAM  Vitals:   06/11/19 1456  BP: 135/67  Pulse: 68  Temp: (!) 97.4 F (36.3 C)  TempSrc: Oral  Weight: 253 lb 6.4 oz (114.9 kg)  Height: 5\' 5"  (1.651 m)   Body mass index is 42.17 kg/m.  Generalized: Well developed, in no acute distress  Cardiology: normal rate and rhythm, no murmur noted Respiratory: Clear to auscultation bilaterally Mallampati: 3+, neck circ 17.5" Neurological examination  Mentation: Alert oriented to time, place, history taking. Follows all commands speech and language fluent Cranial nerve II-XII: Pupils were equal round reactive to light. Extraocular movements were full, visual field were full on confrontational test. Facial sensation and strength were normal. Uvula tongue midline. Head turning and shoulder shrug  were normal and symmetric. Motor: The motor testing reveals 5 over 5 strength of all 4 extremities. Good symmetric motor tone is noted throughout.  Gait and station: Gait is normal.    DIAGNOSTIC DATA (LABS, IMAGING, TESTING) - I reviewed patient records, labs, notes, testing and imaging myself where available.  No flowsheet data found.   Lab Results  Component Value Date   WBC 6.9 04/20/2017   HGB 12.5 04/20/2017   HCT 38.4 04/20/2017   MCV 87 04/20/2017   PLT 175 04/20/2017      Component Value Date/Time   NA 144 09/12/2017 1547   K 4.2 09/12/2017 1547   CL 102 09/12/2017 1547   CO2 23 09/12/2017 1547   GLUCOSE 127 (H) 09/12/2017 1547   GLUCOSE 90 04/18/2016 1729   BUN 27 09/12/2017 1547   CREATININE 1.74 (H) 09/12/2017 1547   CREATININE 1.53 (H) 04/18/2016 1729   CALCIUM 9.6 09/12/2017 1547   PROT 6.8 09/12/2017 1547   ALBUMIN 4.4 09/12/2017 1547   AST 17 09/12/2017 1547   ALT 14 09/12/2017  1547   ALKPHOS 93 09/12/2017 1547   BILITOT 0.3 09/12/2017 1547   GFRNONAA 31 (L) 09/12/2017 1547   GFRNONAA 37 (L) 04/18/2016 1729   GFRAA 36 (L) 09/12/2017 1547   GFRAA 42 (L) 04/18/2016 1729   Lab Results  Component Value Date   CHOL 154 09/12/2017   HDL 35 (L) 09/12/2017   LDLCALC 85 09/12/2017   TRIG 171 (H) 09/12/2017   CHOLHDL 4.4 09/12/2017   Lab Results  Component Value Date   HGBA1C 6.1 (H) 09/12/2017   Lab Results  Component Value Date   VITAMINB12 461 10/26/2013   Lab Results  Component Value Date   TSH 4.370 09/12/2017    ASSESSMENT AND PLAN 63 y.o. year old female  has a past medical history of  Acute on chronic respiratory failure with hypoxia ([facility]) (11/27/2014), Anxiety, Chronic kidney disease (CKD), stage III (moderate), CSA (central sleep apnea), Depression, Diabetes mellitus, GERD (gastroesophageal reflux disease), H/O breast biopsy (02/10/10), High cholesterol, Hyperlipidemia, Hypertension, Hyperthyroidism, LVH (left ventricular hypertrophy), OSA (obstructive sleep apnea), Pneumonia (04/04), RBBB, Sarcoidosis of lung ([place]), Sleep apnea, and Thyroid disease. here with     ICD-10-CM   1. OSA on CPAP  G47.33    Z99.89   2. Hypersomnia with long sleep time, idiopathic  G47.11     Ajana Ditsworth continues to suffer with excessive daytime sleepiness despite CPAP therapy.  Unfortunately, there is no data to review for today's visit to confirm compliance.  She reports that her machine, from 2011, has started giving her trouble.  She reports that it works at times and others it does not.  She is also in an unfortunate situation as she is currently homeless.  I feel that it is warranted to repeat her sleep study.  She needs a replacement CPAP that works consistently.  I suspect that there are social determinants to health affecting her energy levels and contributing to fatigue.  She will continue to follow-up with PCP for these concerns.  We will follow-up with her pending  sleep study and initiation of CPAP therapy.  She verbalizes understanding and agreement with this plan.   No orders of the defined types were placed in this encounter.    No orders of the defined types were placed in this encounter.     I spent 15 minutes with the patient. 50% of this time was spent counseling and educating patient on plan of care and medications.    [name], FNP-C 06/11/2019, 3:38 PM [facility] [address] [place], [place] [address] [phone]

## 2019-06-13 ENCOUNTER — Encounter: Payer: Self-pay | Admitting: Cardiovascular Disease

## 2019-06-13 ENCOUNTER — Other Ambulatory Visit: Payer: Self-pay

## 2019-06-13 ENCOUNTER — Ambulatory Visit: Payer: 59 | Admitting: Cardiovascular Disease

## 2019-06-13 VITALS — BP 153/71 | HR 71 | Temp 97.3°F | Ht 65.0 in | Wt 252.2 lb

## 2019-06-13 DIAGNOSIS — I10 Essential (primary) hypertension: Secondary | ICD-10-CM | POA: Diagnosis not present

## 2019-06-13 DIAGNOSIS — G4731 Primary central sleep apnea: Secondary | ICD-10-CM

## 2019-06-13 DIAGNOSIS — K559 Vascular disorder of intestine, unspecified: Secondary | ICD-10-CM

## 2019-06-13 DIAGNOSIS — R0602 Shortness of breath: Secondary | ICD-10-CM

## 2019-06-13 DIAGNOSIS — N1832 Chronic kidney disease, stage 3b: Secondary | ICD-10-CM

## 2019-06-13 DIAGNOSIS — E782 Mixed hyperlipidemia: Secondary | ICD-10-CM

## 2019-06-13 NOTE — Progress Notes (Signed)
Cardiology Office Note    Date:  06/15/2019   ID:  [name], DOB 1955-11-09, MRN [id number]  PCP:  Isaias Sakai, DO  Cardiologist:   Sanda Klein, MD   Chief Complaint  Patient presents with  . Hyperlipidemia    History of Present Illness:  Rachel Kim is a 63 y.o. female with morbid obesity associated with multiple complications including hypertension, diabetes mellitus requiring insulin, hyperlipidemia and obstructive sleep apnea.  She reports significant complex sleep apnea and hypersomnolence she has been unable to hold down a job.  She is seeking disability.  For the last 16 months she has been homeless and sleeping on a friend's couch.  She does not want to go to shelters because she is still caring for her for the 94 year old pet cat, which she cannot take to a shelter.  She cried telling me the story.  In August 2020 she had an episode of ischemic colitis and it was recommended for her to have coronary evaluation with a nuclear stress test.  Despite these hardships, she reports that glycemic control has been good.  In fact, she was taken off her diabetic medications, because her hemoglobin A1c was very low at 4.8%.  Recently her blood pressure was 135/67.  Her blood pressure is typically a little high in our office and she is clearly emotional today.  Labs from April 04, 2019 showed an excellent LDL at 66 and only mild /hypertriglyceridemia at 188.  Her most recent creatinine from March 29, 2019 was 1.79 and on 04/04/2019 was 1.91, which is actually an improvement from the year before.  Her nephrologist is Dr. Edrick Oh.  She has extensive problems with psoriasis and psoriatic arthritis.  She is receiving Humira.  She has unchanged NYHA functional class II exertional dyspnea but does not have problems with chest pain, orthopnea, PND, leg edema, palpitations, intermittent claudication or focal neurological events.  She denies cough, hemoptysis, wheezing,  falls or bleeding complications.  She reports dizziness when she stands up, consistent with orthostatic hypotension.  She is constantly tired and has daytime hypersomnolence, despite 100% compliance with CPAP.   Past Medical History:  Diagnosis Date  . Acute on chronic respiratory failure with hypoxia ([place]) 11/27/2014  . Anxiety   . Chronic kidney disease (CKD), stage III (moderate)   . CSA (central sleep apnea)   . Depression   . Diabetes mellitus   . GERD (gastroesophageal reflux disease)   . H/O breast biopsy 02/10/10   calcification , no tumors  . High cholesterol   . Hyperlipidemia   . Hypertension   . Hyperthyroidism    diagnosed 1989  . LVH (left ventricular hypertrophy)    echo 02/25/10 EF >55%  . OSA (obstructive sleep apnea)    severe complex apnea  . Pneumonia 04/04  . RBBB   . Sarcoidosis of lung ([place])   . Sleep apnea    uses CPAP  . Thyroid disease    hypothyroidism    Past Surgical History:  Procedure Laterality Date  . ABDOMINAL HYSTERECTOMY  12/07  . APPENDECTOMY    . CARPAL TUNNEL RELEASE     right and left  . CHOLECYSTECTOMY    . ELBOW FRACTURE SURGERY     with pins and pins removed  . laminectomy c5-c6    . laminectomy l5, s1    . RETROPERITONEAL LYMPH NODE EXCISION     right underarm  . RHINOPLASTY     has had several  revisions  . rt knee arthroscopy    . TONSILLECTOMY    . tubinates trimmed      Current Medications: Outpatient Medications Prior to Visit  Medication Sig Dispense Refill  . acetaminophen (TYLENOL) 650 MG CR tablet Take 650 mg 2 (two) times daily by mouth.    . Adalimumab 40 MG/0.4ML PNKT Inject 40 mg into the skin every 14 (fourteen) days.    Marland Kitchen aspirin 81 MG tablet Take 1 tablet (81 mg total) by mouth daily. 30 tablet 0  . benazepril (LOTENSIN) 20 MG tablet TAKE 1 TABLET BY MOUTH ONCE DAILY 90 tablet 1  . Cholecalciferol (VITAMIN D) 2000 UNITS CAPS Take by mouth daily.      . clobetasol (TEMOVATE) 0.05 % external solution  Apply 1 application topically as needed.    Marland Kitchen escitalopram (LEXAPRO) 20 MG tablet TAKE ONE AND ONE-HALF (1 AND1/2) TABLET BY MOUTH DAILY 45 tablet 0  . hydrALAZINE (APRESOLINE) 100 MG tablet Take 1 tablet (100 mg total) 3 (three) times daily by mouth. 270 tablet 3  . indapamide (LOZOL) 1.25 MG tablet Take 1 tablet (1.25 mg total) by mouth daily. 90 tablet 1  . labetalol (NORMODYNE) 100 MG tablet Take 1 tablet (100 mg total) by mouth 2 (two) times daily. 180 tablet 1  . levothyroxine (SYNTHROID, LEVOTHROID) 125 MCG tablet Take 1 tablet (125 mcg total) by mouth daily before breakfast. 90 tablet 1  . MAGNESIUM CITRATE PO Take 150 mg by mouth daily.    . Melatonin 1 MG TABS Take 0.5-1 mg by mouth at bedtime.     . Multiple Vitamins-Minerals (PRESERVISION AREDS 2 PO) Take 1 tablet by mouth 2 (two) times daily.    Vladimir Faster Glycol-Propyl Glycol (SYSTANE) 0.4-0.3 % SOLN Place 2 drops into both eyes 2 (two) times daily.    . polyethylene glycol (MIRALAX / GLYCOLAX) 17 g packet Take 17 g by mouth as needed.    . pravastatin (PRAVACHOL) 80 MG tablet Take 1 tablet (80 mg total) by mouth every morning. 90 tablet 3  . sulfaSALAzine (AZULFIDINE) 500 MG tablet Take by mouth. 500 mg daily and 1000 mg at bedtime     No facility-administered medications prior to visit.      Allergies:   Doxazosin, Labetalol, Actos [pioglitazone hydrochloride], Amlodipine, Cozaar [losartan potassium], Glipizide, Imdur [isosorbide dinitrate], and Clindamycin hcl   Social History   Socioeconomic History  . Marital status: Single    Spouse name: n/a  . Number of children: 0  . Years of education: Master's  . Highest education level: Not on file  Occupational History  . Occupation: Corporate investment banker  Social Needs  . Financial resource strain: Not on file  . Food insecurity    Worry: Not on file    Inability: Not on file  . Transportation needs    Medical: Not on file    Non-medical: Not on file  Tobacco Use  .  Smoking status: Never Smoker  . Smokeless tobacco: Never Used  Substance and Sexual Activity  . Alcohol use: No  . Drug use: No  . Sexual activity: Never    Partners: Male    Birth control/protection: Surgical    Comment: hysterectomy  Lifestyle  . Physical activity    Days per week: Not on file    Minutes per session: Not on file  . Stress: Not on file  Relationships  . Social Herbalist on phone: Not on file    Gets together:  Not on file    Attends religious service: Not on file    Active member of club or organization: Not on file    Attends meetings of clubs or organizations: Not on file    Relationship status: Not on file  Other Topics Concern  . Not on file  Social History Narrative   Lives alone with 2 kitties.   Unemployed currently - she had a temporary job that ended in Dec 2018        Family History:  The patient's family history includes Colitis in her father; Colon cancer in her maternal grandmother; Hypertension in her mother; Stroke in her mother; Thyroid disease in her brother, brother, father, mother, sister, and sister.   ROS:   Please see the history of present illness.    ROS All other systems are reviewed and are negative.  PHYSICAL EXAM:   VS:  BP (!) 153/71   Pulse 71   Temp (!) 97.3 F (36.3 C)   Ht 5\' 5"  (1.651 m)   Wt 252 lb 3.2 oz (114.4 kg)   LMP 07/01/2006   SpO2 95%   BMI 41.97 kg/m      General: Alert, oriented x3, no distress, morbidly obese Head: no evidence of trauma, PERRL, EOMI, no exophtalmos or lid lag, no myxedema, no xanthelasma; normal ears, nose and oropharynx Neck: normal jugular venous pulsations and no hepatojugular reflux; brisk carotid pulses without delay and no carotid bruits Chest: clear to auscultation, no signs of consolidation by percussion or palpation, normal fremitus, symmetrical and full respiratory excursions Cardiovascular: normal position and quality of the apical impulse, regular rhythm, normal  first and second heart sounds, no murmurs, rubs or gallops Abdomen: no tenderness or distention, no masses by palpation, no abnormal pulsatility or arterial bruits, normal bowel sounds, no hepatosplenomegaly Extremities: no clubbing, cyanosis or edema; 2+ radial, ulnar and brachial pulses bilaterally; 2+ right femoral, posterior tibial and dorsalis pedis pulses; 2+ left femoral, posterior tibial and dorsalis pedis pulses; no subclavian or femoral bruits Neurological: grossly nonfocal Psych: Normal mood and affect   Wt Readings from Last 3 Encounters:  06/13/19 252 lb 3.2 oz (114.4 kg)  06/11/19 253 lb 6.4 oz (114.9 kg)  01/16/18 289 lb (131.1 kg)      Studies/Labs Reviewed:   ECHO (05/23/2018 [facility]) The left ventricle is normal in size. There is moderate concentric left ventricular hypertrophy. The left ventricular ejection fraction is normal (55-60%). The left ventricular wall motion is normal. Grade I mild diastolic dysfunction; abnormal relaxation pattern. The left atrium is mildly dilated. The tricuspid valve leaflets are structurally normal with trace regurgitation. Estimation of right ventricular systolic pressure is not possible. The aortic valve is not well visualized, but is grossly normal. The aortic valve is trileaflet.  EKG:  EKG is ordered today.  Change from previous tracings and shows normal sinus rhythm, bifascicular block (RBBB and LAFB), QTC 499 ms Recent Labs: No results found for requested labs within last 8760 hours.  Care everywhere  03/08/2019 Hemoglobin A1c 4.8% 03/29/2019 Glucose 140, BUN 30, creatinine 1.79, potassium 4.1, normal LFTs 04/04/2019 Creatinine 1.91t, normal LFTs  Lipid Panel    Component Value Date/Time   CHOL 154 09/12/2017 1547   TRIG 171 (H) 09/12/2017 1547   HDL 35 (L) 09/12/2017 1547   CHOLHDL 4.4 09/12/2017 1547   CHOLHDL 5.1 (H) 04/18/2016 1729   VLDL 34 (H) 04/18/2016 1729   LDLCALC 85 09/12/2017 1547  09/14/2018 Total  cholesterol 160, triglycerides 187,  HDL 40, LDL 83 05/23/2018 Total cholesterol 110, triglycerides 128 HDL 36, LDL 48  ASSESSMENT:    1. Ischemic colitis ([place])   2. Mixed hyperlipidemia   3. Essential hypertension   4. Stage 3b chronic kidney disease   5. Morbid obesity ([place])   6. Complex sleep apnea syndrome   7. Shortness of breath      PLAN:  In order of problems listed above:  1. HTN: Target BP less than 130/80.  She is close to that and has some symptoms of orthostatic hypotension.  No changes were made to her medications. 2. HLP: Triglycerides remain mildly elevated, but the LDL is at target.  Continue pravastatin 3. DM: Exceptional glucose control without medications.  Unfortunately, this may be a sign of kidney dysfunction. 4. CKD: Sees Dr. Justin Mend.  Most recent creatinine levels are actually encouraging. 5. Morbid obesity: S if she can get medical insurance she would still like to pursue bariatric surgery. 6. OSA: Compliant with CPAP.  Despite this she has severe daytime hypersomnolence and fatigue due to central sleep apnea. 7. Ischemic colitis: The presence of PAD increases the likelihood that she has coronary problems.  She does not have angina pectoris but does have exertional dyspnea and has numerous coronary risk factors including diabetes, obesity, chronic kidney disease, hypertension and hyperlipidemia.  We will schedule for a nuclear perfusion study.  Her most recent previous functional evaluation for coronary disease was a nuclear stress test in 2007 (normal).    Medication Adjustments/Labs and Tests Ordered: Current medicines are reviewed at length with the patient today.  Concerns regarding medicines are outlined above.  Medication changes, Labs and Tests ordered today are listed in the Patient Instructions below. Patient Instructions  Medication Instructions:  No changes *If you need a refill on your cardiac medications before your next appointment, please call  your pharmacy*  Lab Work: None ordered If you have labs (blood work) drawn today and your tests are completely normal, you will receive your results only by: Marland Kitchen MyChart Message (if you have MyChart) OR . A paper copy in the mail If you have any lab test that is abnormal or we need to change your treatment, we will call you to review the results.  Testing/Procedures: Your physician has requested that you have a lexiscan myoview. For further information please visit [email or web address]. Please follow instruction sheet, as given. This will take place at [place], [address]  How to prepare for your Myocardial Perfusion Test:  Do not eat or drink 3 hours prior to your test, except you may have water.  Do not consume products containing caffeine (regular or decaffeinated) 12 hours prior to your test. (ex: coffee, chocolate, sodas, tea).  Do bring a list of your current medications with you.  If not listed below, you may take your medications as normal.  Do wear comfortable clothes (no dresses or overalls) and walking shoes, tennis shoes preferred (No heels or open toe shoes are allowed).  Do NOT wear cologne, perfume, aftershave, or lotions (deodorant is allowed).  The test will take approximately 3 to 4 hours to complete  If these instructions are not followed, your test will have to be rescheduled.    Follow-Up: At [facility], you and your health needs are our priority.  As part of our continuing mission to provide you with exceptional heart care, we have created designated Provider Care Teams.  These Care Teams include your primary Cardiologist (physician) and Advanced Practice Providers (APPs -  Physician Assistants and Nurse Practitioners) who all work together to provide you with the care you need, when you need it.  Your next appointment:   12 months  The format for your next appointment:   In Person  Provider:   Sanda Klein, MD       Signed, Sanda Klein, MD  06/15/2019 7:05 PM    [address], [place], [place]  [address] Phone: [phone]; Fax: [phone]

## 2019-06-13 NOTE — Patient Instructions (Signed)
Medication Instructions:  No changes *If you need a refill on your cardiac medications before your next appointment, please call your pharmacy*  Lab Work: None ordered If you have labs (blood work) drawn today and your tests are completely normal, you will receive your results only by: Marland Kitchen MyChart Message (if you have MyChart) OR . A paper copy in the mail If you have any lab test that is abnormal or we need to change your treatment, we will call you to review the results.  Testing/Procedures: Your physician has requested that you have a lexiscan myoview. For further information please visit [email or web address]. Please follow instruction sheet, as given. This will take place at [place], [address]  How to prepare for your Myocardial Perfusion Test:  Do not eat or drink 3 hours prior to your test, except you may have water.  Do not consume products containing caffeine (regular or decaffeinated) 12 hours prior to your test. (ex: coffee, chocolate, sodas, tea).  Do bring a list of your current medications with you.  If not listed below, you may take your medications as normal.  Do wear comfortable clothes (no dresses or overalls) and walking shoes, tennis shoes preferred (No heels or open toe shoes are allowed).  Do NOT wear cologne, perfume, aftershave, or lotions (deodorant is allowed).  The test will take approximately 3 to 4 hours to complete  If these instructions are not followed, your test will have to be rescheduled.    Follow-Up: At [facility], you and your health needs are our priority.  As part of our continuing mission to provide you with exceptional heart care, we have created designated Provider Care Teams.  These Care Teams include your primary Cardiologist (physician) and Advanced Practice Providers (APPs -  Physician Assistants and Nurse Practitioners) who all work together to provide you with the care you need, when you need it.  Your next appointment:    12 months  The format for your next appointment:   In Person  Provider:   Sanda Klein, MD

## 2019-06-15 ENCOUNTER — Encounter: Payer: Self-pay | Admitting: Cardiovascular Disease

## 2019-06-20 ENCOUNTER — Telehealth (HOSPITAL_COMMUNITY): Payer: Self-pay

## 2019-06-20 NOTE — Telephone Encounter (Signed)
Encounter complete. 

## 2019-06-25 ENCOUNTER — Ambulatory Visit (HOSPITAL_COMMUNITY)
Admission: RE | Admit: 2019-06-25 | Payer: Medicaid Other | Source: Ambulatory Visit | Attending: Cardiovascular Disease | Admitting: Cardiovascular Disease

## 2019-06-25 ENCOUNTER — Encounter: Payer: Self-pay | Admitting: Cardiovascular Disease

## 2019-06-26 ENCOUNTER — Ambulatory Visit (HOSPITAL_COMMUNITY): Payer: 59

## 2019-07-09 ENCOUNTER — Telehealth (HOSPITAL_COMMUNITY): Payer: Self-pay

## 2019-07-09 ENCOUNTER — Ambulatory Visit (INDEPENDENT_AMBULATORY_CARE_PROVIDER_SITE_OTHER): Payer: Medicaid Other | Admitting: Neurology

## 2019-07-09 DIAGNOSIS — G4733 Obstructive sleep apnea (adult) (pediatric): Secondary | ICD-10-CM

## 2019-07-09 DIAGNOSIS — R063 Periodic breathing: Secondary | ICD-10-CM

## 2019-07-09 DIAGNOSIS — G4734 Idiopathic sleep related nonobstructive alveolar hypoventilation: Secondary | ICD-10-CM

## 2019-07-09 DIAGNOSIS — G4711 Idiopathic hypersomnia with long sleep time: Secondary | ICD-10-CM

## 2019-07-09 DIAGNOSIS — Z9989 Dependence on other enabling machines and devices: Secondary | ICD-10-CM

## 2019-07-09 DIAGNOSIS — N1832 Chronic kidney disease, stage 3b: Secondary | ICD-10-CM

## 2019-07-09 NOTE — Telephone Encounter (Signed)
Encounter complete. 

## 2019-07-10 ENCOUNTER — Telehealth (HOSPITAL_COMMUNITY): Payer: Self-pay

## 2019-07-10 ENCOUNTER — Other Ambulatory Visit: Payer: Self-pay | Admitting: *Deleted

## 2019-07-10 DIAGNOSIS — E782 Mixed hyperlipidemia: Secondary | ICD-10-CM

## 2019-07-10 MED ORDER — ATORVASTATIN CALCIUM 20 MG PO TABS: 20.0000 mg | ORAL_TABLET | Freq: Every day | ORAL | 3 refills | Status: DC

## 2019-07-10 NOTE — Telephone Encounter (Signed)
Encounter complete. 

## 2019-07-11 ENCOUNTER — Other Ambulatory Visit: Payer: Self-pay

## 2019-07-11 ENCOUNTER — Ambulatory Visit (HOSPITAL_COMMUNITY)
Admission: RE | Admit: 2019-07-11 | Discharge: 2019-07-11 | Disposition: A | Discharge status: Home / Self Care | Payer: Medicaid Other | Source: Ambulatory Visit | Attending: Internal Medicine | Admitting: Internal Medicine

## 2019-07-11 ENCOUNTER — Other Ambulatory Visit: Payer: Self-pay | Admitting: *Deleted

## 2019-07-11 DIAGNOSIS — R0602 Shortness of breath: Secondary | ICD-10-CM | POA: Diagnosis present

## 2019-07-11 MED ORDER — TECHNETIUM TC 99M TETROFOSMIN IV KIT
28.0000 | PACK | Freq: Once | INTRAVENOUS | Status: DC | PRN
  Filled 2019-07-11: qty 28

## 2019-07-11 MED ORDER — REGADENOSON 0.4 MG/5ML IV SOLN: 0.4000 mg | Freq: Once | INTRAVENOUS | Status: DC

## 2019-07-11 MED ORDER — AMINOPHYLLINE 25 MG/ML IV SOLN: 75.0000 mg | Freq: Once | INTRAVENOUS | Status: DC

## 2019-07-11 MED ORDER — ATORVASTATIN CALCIUM 20 MG PO TABS: 20.0000 mg | ORAL_TABLET | Freq: Every day | ORAL | 3 refills | Status: DC

## 2019-07-12 ENCOUNTER — Ambulatory Visit (HOSPITAL_COMMUNITY)
Admission: RE | Admit: 2019-07-12 | Discharge: 2019-07-12 | Disposition: A | Discharge status: Home / Self Care | Payer: Medicaid Other | Source: Ambulatory Visit | Attending: Cardiovascular Disease | Admitting: Cardiovascular Disease

## 2019-07-12 LAB — MYOCARDIAL PERFUSION IMAGING
LV dias vol: 127 mL (ref 46–106)
LV sys vol: 35 mL
Peak HR: 102 {beats}/min
Rest HR: 66 {beats}/min
SDS: 2
SRS: 2
SSS: 4
TID: 0.83

## 2019-07-12 MED ORDER — TECHNETIUM TC 99M TETROFOSMIN IV KIT
32.1000 | PACK | Freq: Once | INTRAVENOUS | Status: AC | PRN
  Administered 2019-07-12: 32.1 via INTRAVENOUS

## 2019-07-18 ENCOUNTER — Telehealth: Payer: Self-pay | Admitting: Neurology

## 2019-07-18 DIAGNOSIS — G4734 Idiopathic sleep related nonobstructive alveolar hypoventilation: Secondary | ICD-10-CM | POA: Insufficient documentation

## 2019-07-18 DIAGNOSIS — R063 Periodic breathing: Secondary | ICD-10-CM | POA: Insufficient documentation

## 2019-07-18 NOTE — Telephone Encounter (Signed)
-----   Message from Larey Seat, MD sent at 07/18/2019  4:55 PM EST ----- URGENT- severe central apnea with hypoxemia. may need CPAP or BiPAP as central apnea dominated her PSG, has long, low oxygen desaturations- consider 02 titration if appropriate.

## 2019-07-18 NOTE — Addendum Note (Signed)
Addended by: Larey Seat on: 07/18/2019 04:55 PM   Modules accepted: Orders

## 2019-07-18 NOTE — Telephone Encounter (Signed)
Called patient to discuss sleep study results. No answer at this time. LVM for the patient to call back.  I did include in VM that severe apnea is present and she would like for the patient to return for titration study. Informed sleep lab would get insurance approval and then call to schedule soon as we have that.   If patient calls back please advise the above information and inform that sleep lab will be contacting her soon to bring her in for titration study.

## 2019-07-18 NOTE — Procedures (Signed)
PATIENT'S NAME:  Rachel Kim, Rachel Kim DOB:      [birth year]      MR#:    [id number]     DATE OF RECORDING: 07/09/2019 REFERRING M.D.:  Dr. Benjamine Mola Mumow/ Amy Ubaldo Glassing, NP Study Performed:   Baseline Polysomnogram HISTORY:  63 y.o. female saw [name], NP follow up for OSA on CPAP.  She reports that she is using CPAP every night, however, she has had difficulty with her machine "acting up".  She is currently homeless.  She reports that she is sleeping at a friend's house at the time being.  She has lost some of her supplies.  The machine works at times and other times it does not.  She is now insured with Medicaid and is interested in a new CPAP machine.   Her current machine is from 2011.  She did have a sleep study here with [place] last in 2011.  There is no compliance data to review at today's visit.  She is adamant that she uses CPAP nightly.  She endorses significant daytime sleepiness despite compliance with CPAP therapy.  She is always tired.  Some days she does not sleep at all and others she sleeps all day.  The patient endorsed the Epworth Sleepiness Scale at 18/24 points.   The patient's weight 254 pounds with a height of 65 (inches), resulting in a BMI of 42.2 kg/m2. The patient's neck circumference measured 17.5 inches.  CURRENT MEDICATIONS: Tylenol, Aspirin, Lotensin, Temovate, Lexapro, Apresoline, Lozol, Normodyne, Synthroid, Melatonin, Glycolax, Trulicity, Myrbetriq   PROCEDURE:  This is a multichannel digital polysomnogram utilizing the Somnostar 11.2 system.  Electrodes and sensors were applied and monitored per AASM Specifications.   EEG, EOG, Chin and Limb EMG, were sampled at 200 Hz.  ECG, Snore and Nasal Pressure, Thermal Airflow, Respiratory Effort, CPAP Flow and Pressure, Oximetry was sampled at 50 Hz. Digital video and audio were recorded.      BASELINE STUDY : Lights Out was at 22:51 and Lights On at 04:59.  Total recording time (TRT) was 368.5 minutes, with a total sleep time (TST) of 230  minutes.   The patient's sleep latency was 52 minutes.  REM latency was 37.5 minutes.  The sleep efficiency was 62.4 %.     SLEEP ARCHITECTURE: WASO (Wake after sleep onset) was 99.5 minutes.  There were 107.5 minutes in Stage N1, 66 minutes Stage N2, 30.5 minutes Stage N3 and 26 minutes in Stage REM.  The percentage of Stage N1 was 46.7%, Stage N2 was 28.7%, Stage N3 was 13.3% and Stage R (REM sleep) was 11.3%.   RESPIRATORY ANALYSIS:  There were a total of 410 respiratory events:  1 obstructive apnea, 86 central apneas and 46 mixed apneas with a total of 133 apneas and an apnea index (AI) of 34.7 /hour. There were 277 hypopneas with a hypopnea index of 72.3 /hour.  The total APNEA/HYPOPNEA INDEX (AHI) was 107.0/hour and the total RESPIRATORY DISTURBANCE INDEX was 107.0 /hour.  51 events occurred in REM sleep and 603 events in NREM. The REM AHI was  117.7 /hour, versus a non-REM AHI of 105.6/h. The patient spent 58 minutes of total sleep time in the supine position and 172 minutes in non-supine. The supine AHI was 102.4 versus a non-supine AHI of 108.5/h.  OXYGEN SATURATION & C02:  The Wake baseline 02 saturation was 94%, with the lowest being 71%. Time spent below 89% saturation equaled 225 minutes.  The arousals were noted as: 39 were spontaneous, 0 were  associated with PLMs, 261 were associated with respiratory events. The patient had a total of 0 Periodic Limb Movements.    Audio and video analysis did not show any abnormal or unusual movements, behaviors, phonations or vocalizations.   Thunderous Snoring was noted. EKG was in keeping with normal sinus rhythm (NSR).    IMPRESSION:  1. Severest degree of Central Sleep Apnea (CSA) at AHI of 107/h, with prolonged hypoxemia, very low xygen nadir.  2. Thunderous Snoring 3. Central Sleep Apnea    RECOMMENDATIONS:  1. Advise urgent, full-night, attended, CPAP titration study to optimize therapy.     I certify that I have reviewed the  entire raw data recording prior to the issuance of this report in accordance with the Standards of Accreditation of the American Academy of Sleep Medicine (AASM)   Larey Seat, MD   07-18-2019 Diplomat, American Board of Psychiatry and Neurology  Diplomat, American Board of [place] Director, [place] [facility]

## 2019-07-29 HISTORY — PX: OTHER SURGICAL HISTORY: SHX169

## 2019-08-12 ENCOUNTER — Other Ambulatory Visit (HOSPITAL_COMMUNITY)
Admission: RE | Admit: 2019-08-12 | Discharge: 2019-08-12 | Disposition: A | Discharge status: Home / Self Care | Payer: Medicaid Other | Source: Ambulatory Visit | Attending: Neurology | Admitting: Neurology

## 2019-08-12 DIAGNOSIS — Z20822 Contact with and (suspected) exposure to covid-19: Secondary | ICD-10-CM | POA: Insufficient documentation

## 2019-08-12 DIAGNOSIS — Z01812 Encounter for preprocedural laboratory examination: Secondary | ICD-10-CM | POA: Insufficient documentation

## 2019-08-12 LAB — SARS CORONAVIRUS 2 (TAT 6-24 HRS): SARS Coronavirus 2: NEGATIVE

## 2019-08-14 ENCOUNTER — Other Ambulatory Visit: Payer: Self-pay

## 2019-08-14 ENCOUNTER — Ambulatory Visit (INDEPENDENT_AMBULATORY_CARE_PROVIDER_SITE_OTHER): Payer: Medicaid Other | Admitting: Neurology

## 2019-08-14 DIAGNOSIS — G4711 Idiopathic hypersomnia with long sleep time: Secondary | ICD-10-CM

## 2019-08-14 DIAGNOSIS — N1832 Chronic kidney disease, stage 3b: Secondary | ICD-10-CM

## 2019-08-14 DIAGNOSIS — G4734 Idiopathic sleep related nonobstructive alveolar hypoventilation: Secondary | ICD-10-CM

## 2019-08-14 DIAGNOSIS — G4733 Obstructive sleep apnea (adult) (pediatric): Secondary | ICD-10-CM | POA: Diagnosis not present

## 2019-08-14 DIAGNOSIS — Z9989 Dependence on other enabling machines and devices: Secondary | ICD-10-CM

## 2019-08-14 DIAGNOSIS — R063 Periodic breathing: Secondary | ICD-10-CM

## 2019-08-22 DIAGNOSIS — Z0271 Encounter for disability determination: Secondary | ICD-10-CM

## 2019-08-27 ENCOUNTER — Telehealth: Payer: Self-pay | Admitting: Neurology

## 2019-08-27 NOTE — Procedures (Signed)
PATIENT'S NAME:  Rachel Kim, Rachel Kim DOB:      27-May-1956      MR#:    [id number]     DATE OF RECORDING: 08/14/2019 REFERRING M.D.:  [name], NP Study Performed:   Titration to positive airway pressure HISTORY:  64 y.o. female saw Amy Lomax, NP follow up for OSA on CPAP.  She reports that she is using CPAP every night, however, she has had difficulty with her machine "acting up".  She is currently homeless. She reports that she is sleeping at a friend's house at the time being.  The machine works at times and other times it does not.  She is now insured with Medicaid and is interested in a new CPAP machine.   Her current machine is from 2011.  She did have a sleep study here with [place] last in 2011.  There is no compliance data to review at today's visit.  She is adamant that she uses CPAP nightly.  She endorses significant daytime sleepiness despite compliance with CPAP therapy.    Baseline sleep study from 07/09/2019 revealed : The total APNEA/HYPOPNEA INDEX (AHI) was 107.0/hour and the total RESPIRATORY DISTURBANCE INDEX was 107.0 /hour.  51 events occurred in REM sleep and 603 events in NREM. The REM AHI was 117.7 /hour, versus a non-REM AHI of 105.6/h. The patient spent 58 minutes of total sleep time in the supine position and 172 minutes in non-supine. The supine AHI was 102.4 versus a non-supine AHI of 108.5/h.  The patient endorsed the Epworth Sleepiness Scale at 18 points.   The patient's weight 254 pounds with a height of 65 (inches), resulting in a BMI of 42.2 kg/m2. The patient's neck circumference measured 17.5 inches.  CURRENT MEDICATIONS: Tylenol, Aspirin, Lotensin, Temovate, Lexapro, Apresoline, Lozol, Normodyne, Synthroid, Melatonin, Glycolax, Trulicity, Myrbetriq    PROCEDURE:  This is a multichannel digital polysomnogram utilizing the SomnoStar 11.2 system.  Electrodes and sensors were applied and monitored per AASM Specifications.   EEG, EOG, Chin and Limb EMG, were sampled at 200 Hz.  ECG,  Snore and Nasal Pressure, Thermal Airflow, Respiratory Effort, CPAP Flow and Pressure, Oximetry was sampled at 50 Hz. Digital video and audio were recorded.      CPAP was initiated under a [employer] ESON 2 in medium size- at 5 cmH20 with heated humidity per AASM split night standards and pressure was advanced to 18 cmH20 because of hypopneas, apneas and desaturations. The modality changed to BiPAP at 19/15 and was explored up to 23/18 cm water -  At a PAP pressure of 23/18 cmH20, there was a reduction of the AHI to 0.0/h with improvement of sleep apnea.  Lights Out was at 21:46 and Lights On at 04:52. Total recording time (TRT) was 427 minutes, with a total sleep time (TST) of 272 minutes. The patient's sleep latency was 126.5 minutes. REM latency was 0 minutes.  The sleep efficiency was 63.7 %.    SLEEP ARCHITECTURE: WASO (Wake after sleep onset) was 153 minutes.  There were 62.5 minutes in Stage N1, 161.5 minutes Stage N2, 48 minutes Stage N3 and 0 minutes in Stage REM.  The percentage of Stage N1 was 23.%, Stage N2 was 59.4%, Stage N3 was 17.6% and Stage R (REM sleep) was 0%. The sleep architecture was notably void of REM sleep. .  RESPIRATORY ANALYSIS:  There was a total of 51 respiratory events: 0 obstructive apneas, 0 central apneas and 0 mixed apneas with a total of 0 apneas  and an apnea index (AI) of 0 /hour. There were 51 hypopneas with a hypopnea index of 11.25/hour. The patient also had 0 respiratory event related arousals (RERAs).     The total APNEA/HYPOPNEA INDEX  (AHI) was 11.25 /hour and the total RESPIRATORY DISTURBANCE INDEX was 11.25 /hour  0 events occurred in REM sleep and 51 events in NREM. The REM AHI was 0 /hour versus a non-REM AHI of 11.25 /hour.  The patient spent 57 minutes of total sleep time in the supine position and 215 minutes in non-supine. The supine AHI was 23.2, versus a non-supine AHI of 8.1.  OXYGEN SATURATION & C02:  The baseline 02 saturation was 89%, with  the lowest being 83%. Time spent below 89% saturation equaled 25 minutes.  The arousals were noted as: 106 were spontaneous, 0 were associated with PLMs, 21 were associated with respiratory events. The patient had a total of 0 Periodic Limb Movements.   Audio and video analysis did not show any abnormal or unusual movements, behaviors, phonations or vocalizations.   The patient took bathroom breaks. EKG was in keeping with normal sinus rhythm (NSR).  DIAGNOSIS Complex Sleep Apnea responded to BiPAP at 23/18 cm water. PAP was initiated under a [employer] ESON 2 in medium size- The modality changed to BiPAP at 19/15 and was explored up to 23/18 cm water -  At a PAP pressure of 23/18 cmH20, there was a reduction of the AHI to 0.0/h with improvement of sleep apnea.  PLANS/RECOMMENDATIONS: Ordered as mentioned above.     DISCUSSION: A follow up appointment will be scheduled in the Sleep Clinic at [facility].   Please call [phone] with any questions.      I certify that I have reviewed the entire raw data recording prior to the issuance of this report in accordance with the Standards of Accreditation of the American Academy of Sleep Medicine (AASM)  Larey Seat, M.D. Diplomat, Tax adviser of Psychiatry and Neurology  Diplomat, Tax adviser of Sleep Medicine Market researcher, [employer] [facility] [employer]

## 2019-08-27 NOTE — Telephone Encounter (Signed)
I called pt. I advised pt that Dr. Brett Fairy reviewed their sleep study results and found that pt best tolerated BiPAP. Dr. Brett Fairy recommends that pt starts BiPAP. I reviewed PAP compliance expectations with the pt. Pt is agreeable to starting a CPAP. I advised pt that an order will be sent to a DME, [facility], and [place] will call the pt within about one week after they file with the pt's insurance. [place] will show the pt how to use the machine, fit for masks, and troubleshoot the CPAP if needed. A follow up appt was made for insurance purposes with Joaquim Lai, NP on April 19,2021 at 3 pm. Pt verbalized understanding to arrive 15 minutes early and bring their CPAP. A letter with all of this information in it will be mailed to the pt as a reminder. I verified with the pt that the address we have on file is correct. Pt verbalized understanding of results. Pt had no questions at this time but was encouraged to call back if questions arise. I have sent the order to [facility] and have received confirmation that they have received the order.

## 2019-08-27 NOTE — Telephone Encounter (Signed)
-----   Message from Larey Seat, MD sent at 08/27/2019 10:13 AM EST ----- PATIENT'S NAME:  Rachel Kim, Rachel Kim DOB:      1955/09/10      MR#:    [id number]     DATE OF RECORDING: 08/14/2019 REFERRING M.D.:  [name], NP Baseline sleep study from 07/09/2019 revealed : The total APNEA/HYPOPNEA INDEX (AHI) was 107.0/hour and the total RESPIRATORY DISTURBANCE INDEX was 107.0 /hour.  51 events occurred in REM sleep and 603 events in NREM. The REM AHI was 117.7 /hour, versus a non-REM AHI of 105.6/h. The patient spent 58 minutes of total sleep time in the supine position and 172 minutes in non-supine. The supine AHI was 102.4 versus a non-supine AHI of 108.5/h.  The patient endorsed the Epworth Sleepiness Scale at 18 points.   The patient's weight 254 pounds with a height of 65 (inches), resulting in a BMI of 42.2 kg/m2. The patient's neck circumference measured 17.5 inches.   CPAP was initiated under a [employer] ESON 2 in medium size- at 5 cmH20 with heated humidity per AASM split night standards and pressure was advanced to 18 cmH20 because of hypopneas, apneas and desaturations. The modality changed to BiPAP at 19/15 and was explored up to 23/18 cm water -  At a PAP pressure of 23/18 cmH20, there was a reduction of the AHI to 0.0/h with improvement of sleep apnea.    DIAGNOSIS Complex Sleep Apnea failed to respond to CPAP at highest pressure responded to BiPAP at 23/18 cm water. PAP was initiated under a [employer] ESON 2 in medium size- The modality changed to BiPAP at 19/15 and was explored up to 23/18 cm water -  At a PAP pressure of 23/18 cmH20, there was a reduction of the AHI to 0.0/h with improvement of sleep apnea.  PLANS/RECOMMENDATIONS: Ordered as mentioned above.     DISCUSSION: A follow up appointment will be scheduled in the Sleep Clinic at [facility].   Please call [phone] with any questions.      I certify that I have reviewed the entire raw data  recording prior to the issuance of this report in accordance with the Standards of Accreditation of the American Academy of Sleep Medicine (AASM)  Larey Seat, M.D. Diplomat, Tax adviser of Psychiatry and Neurology  Diplomat, Tax adviser of Sleep Medicine Market researcher, [employer] [facility] [employer]

## 2019-08-27 NOTE — Addendum Note (Signed)
Addended by: Larey Seat on: 08/27/2019 10:13 AM   Modules accepted: Orders

## 2019-08-27 NOTE — Progress Notes (Signed)
PATIENT'S NAME:  Rachel Kim, Rachel Kim DOB:      Sep 03, 1955      MR#:    [id number]     DATE OF RECORDING: 08/14/2019 REFERRING M.D.:  [name], NP Baseline sleep study from 07/09/2019 revealed : The total APNEA/HYPOPNEA INDEX (AHI) was 107.0/hour and the total RESPIRATORY DISTURBANCE INDEX was 107.0 /hour.  51 events occurred in REM sleep and 603 events in NREM. The REM AHI was 117.7 /hour, versus a non-REM AHI of 105.6/h. The patient spent 58 minutes of total sleep time in the supine position and 172 minutes in non-supine. The supine AHI was 102.4 versus a non-supine AHI of 108.5/h.  The patient endorsed the Epworth Sleepiness Scale at 18 points.   The patient's weight 254 pounds with a height of 65 (inches), resulting in a BMI of 42.2 kg/m2. The patient's neck circumference measured 17.5 inches.   CPAP was initiated under a [employer] ESON 2 in medium size- at 5 cmH20 with heated humidity per AASM split night standards and pressure was advanced to 18 cmH20 because of hypopneas, apneas and desaturations. The modality changed to BiPAP at 19/15 and was explored up to 23/18 cm water -  At a PAP pressure of 23/18 cmH20, there was a reduction of the AHI to 0.0/h with improvement of sleep apnea.    DIAGNOSIS Complex Sleep Apnea failed to respond to CPAP at highest pressure responded to BiPAP at 23/18 cm water. PAP was initiated under a [employer] ESON 2 in medium size- The modality changed to BiPAP at 19/15 and was explored up to 23/18 cm water -  At a PAP pressure of 23/18 cmH20, there was a reduction of the AHI to 0.0/h with improvement of sleep apnea.  PLANS/RECOMMENDATIONS: Ordered as mentioned above.     DISCUSSION: A follow up appointment will be scheduled in the Sleep Clinic at [facility].   Please call [phone] with any questions.      I certify that I have reviewed the entire raw data recording prior to the issuance of this report in accordance with the  Standards of Accreditation of the American Academy of Sleep Medicine (AASM)  Larey Seat, M.D. Diplomat, Tax adviser of Psychiatry and Neurology  Diplomat, Tax adviser of Sleep Medicine Market researcher, [employer] [facility] [employer]

## 2019-11-18 ENCOUNTER — Encounter: Payer: Self-pay | Admitting: Family Medicine

## 2019-11-18 ENCOUNTER — Ambulatory Visit: Payer: Medicaid Other | Admitting: Family Medicine

## 2019-11-18 VITALS — BP 153/86 | HR 71 | Temp 97.4°F | Ht 65.0 in | Wt 266.0 lb

## 2019-11-18 DIAGNOSIS — G4733 Obstructive sleep apnea (adult) (pediatric): Secondary | ICD-10-CM

## 2019-11-18 DIAGNOSIS — Z9989 Dependence on other enabling machines and devices: Secondary | ICD-10-CM

## 2019-11-18 NOTE — Progress Notes (Signed)
PATIENT: Rachel Kim DOB: 1956-05-23  REASON FOR VISIT: follow up HISTORY FROM: patient  Chief Complaint  Patient presents with  . Follow-up    initial BiPAP check; since she started it she has  more days where she doesn't wake up feeling sick. However she has more days where she wakes up with swollen face and hands (not ankles). It resolves once she gets up and moving, about 3 hours or so. It's very uncomfortable. She loves her new machine.   Marland Kitchen Room 4    here alone has mask on     HISTORY OF PRESENT ILLNESS: Today 11/18/19 Rachel Kim is a 64 y.o. female here today for follow up for OSA on BiPAP therapy.  Bea reports that she is doing very well on BiPAP therapy.  She is received her new machine and all supplies needed.  She denies any concerns with BiPAP therapy.  She has noted more swelling of her hands over the past 2 months.  She was approved for disability in December and is now living in her own apartment.  She does admit to significant weight gain as she reports celebrating living on her own.  She does state that she has had more fast food recently.  Blood pressures have been mildly elevated.  She is followed by primary care for management.  Compliance report dated 10/19/2019 through 11/17/2019 reveals that she used BiPAP therapy 30 of the past 30 days for compliance of 100%.  She used BiPAP greater than 4 hours 30 of the past 30 days for compliance 100%.  Average usage was 9 hours and 43 minutes.  Residual AHI was 4.2 with IPAP of 23 cm of water and EPAP of 18 cm of water, pressure support of 4 cm water.  There was no leak noted.  HISTORY: (copied from my note on 06/11/2019)  Rachel Kim is a 64 y.o. female here today for follow up for OSA on CPAP.  She reports that she is using CPAP every night, however, she has had difficulty with her machine "acting up".  She is currently homeless.  She reports that she is sleeping at a friend's house at the time being.  She has lost some  of her supplies.  The machine works at times and other times it does not.  She is now insured with Medicaid and is interested in a new CPAP machine.  Her current machine is from 2011.  She did have a sleep study here with [place] last in 2011.  There is no compliance data to review at today's visit.  She is adamant that she uses CPAP nightly.  She endorses significant daytime sleepiness despite compliance with CPAP therapy.  She is always tired.  Some days she does not sleep at all and others she sleeps all day.   REVIEW OF SYSTEMS: Out of a complete 14 system review of symptoms, the patient complains only of the following symptoms, swelling of hands and all other reviewed systems are negative.  ALLERGIES: Allergies  Allergen Reactions  . Doxazosin Swelling  . Labetalol Swelling    In doses >100 mg tablet has severe, painful swelling  . Actos [Pioglitazone Hydrochloride] Swelling  . Amlodipine Swelling  . Cozaar [Losartan Potassium] Other (See Comments)    Causes drowsiness/zombie like  . Glipizide Swelling  . Imdur [Isosorbide Dinitrate] Other (See Comments)    Causes severe headaches  . Clindamycin Hcl Rash    HOME MEDICATIONS: Outpatient Medications Prior to Visit  Medication Sig Dispense Refill  . acetaminophen (TYLENOL) 650 MG CR tablet Take 650 mg 2 (two) times daily by mouth.    . Adalimumab 40 MG/0.4ML PNKT Inject 40 mg into the skin every 14 (fourteen) days.    Marland Kitchen aspirin 81 MG tablet Take 1 tablet (81 mg total) by mouth daily. 30 tablet 0  . benazepril (LOTENSIN) 20 MG tablet TAKE 1 TABLET BY MOUTH ONCE DAILY 90 tablet 1  . Cholecalciferol (VITAMIN D) 2000 UNITS CAPS Take by mouth daily.      . clobetasol (TEMOVATE) 0.05 % external solution Apply 1 application topically as needed.    Marland Kitchen escitalopram (LEXAPRO) 20 MG tablet TAKE ONE AND ONE-HALF (1 AND1/2) TABLET BY MOUTH DAILY 45 tablet 0  . FLUTICASONE PROPIONATE NA Place 2 puffs into the nose daily.    . hydrALAZINE (APRESOLINE)  100 MG tablet Take 1 tablet (100 mg total) 3 (three) times daily by mouth. 270 tablet 3  . indapamide (LOZOL) 1.25 MG tablet Take 1 tablet (1.25 mg total) by mouth daily. 90 tablet 1  . labetalol (NORMODYNE) 100 MG tablet Take 1 tablet (100 mg total) by mouth 2 (two) times daily. 180 tablet 1  . levothyroxine (SYNTHROID, LEVOTHROID) 125 MCG tablet Take 1 tablet (125 mcg total) by mouth daily before breakfast. 90 tablet 1  . MAGNESIUM OXIDE PO Takes 250 mg x 2 daily    . Melatonin 1 MG TABS Take 0.5-1 mg by mouth at bedtime.     . Multiple Vitamins-Minerals (PRESERVISION AREDS 2 PO) Take 2 capsules by mouth daily.     Vladimir Faster Glycol-Propyl Glycol (SYSTANE) 0.4-0.3 % SOLN Place 2 drops into both eyes 2 (two) times daily.    . polyethylene glycol (MIRALAX / GLYCOLAX) 17 g packet Take 17 g by mouth as needed.    Marland Kitchen UNABLE TO FIND Take 1 capsule by mouth in the morning and at bedtime. Med Name: SONUS complete (herbs)    . atorvastatin (LIPITOR) 20 MG tablet Take 1 tablet (20 mg total) by mouth daily. 90 tablet 3  . sulfaSALAzine (AZULFIDINE) 500 MG tablet Take by mouth. 500 mg daily and 1000 mg at bedtime    . MAGNESIUM CITRATE PO Take 150 mg by mouth daily.     No facility-administered medications prior to visit.    PAST MEDICAL HISTORY: Past Medical History:  Diagnosis Date  . Acute on chronic respiratory failure with hypoxia ([place]) 11/27/2014  . Anxiety   . Chronic kidney disease (CKD), stage III (moderate)   . CSA (central sleep apnea)   . Depression   . Diabetes mellitus   . GERD (gastroesophageal reflux disease)   . H/O breast biopsy 02/10/10   calcification , no tumors  . High cholesterol   . Hyperlipidemia   . Hypertension   . Hyperthyroidism    diagnosed 1989  . LVH (left ventricular hypertrophy)    echo 02/25/10 EF >55%  . OSA (obstructive sleep apnea)    severe complex apnea  . Pneumonia 04/04  . RBBB   . Sarcoidosis of lung ([place])   . Sleep apnea    uses CPAP  .  Thyroid disease    hypothyroidism    PAST SURGICAL HISTORY: Past Surgical History:  Procedure Laterality Date  . ABDOMINAL HYSTERECTOMY  12/07  . APPENDECTOMY  1970  . CARPAL TUNNEL RELEASE     right and left  . CHOLECYSTECTOMY  1998  . ELBOW FRACTURE SURGERY     with pins  and pins removed  . laminectomy c5-c6  2001  . laminectomy l5, s1     [place]  . RETROPERITONEAL LYMPH NODE EXCISION     right underarm  . RHINOPLASTY  1985   has had several revisions  . rt knee arthroscopy  1997  . TONSILLECTOMY  1960  . tube placement L ear Left 07/29/2019  . tubinates trimmed  1986    FAMILY HISTORY: Family History  Problem Relation Age of Onset  . Colitis Father   . Thyroid disease Father   . Colon cancer Maternal Grandmother   . Hypertension Mother   . Stroke Mother   . Thyroid disease Mother   . Thyroid disease Sister   . Thyroid disease Brother   . Thyroid disease Sister   . Thyroid disease Brother     SOCIAL HISTORY: Social History   Socioeconomic History  . Marital status: Single    Spouse name: n/a  . Number of children: 0  . Years of education: Master's  . Highest education level: Not on file  Occupational History  . Occupation: Corporate investment banker  Tobacco Use  . Smoking status: Never Smoker  . Smokeless tobacco: Never Used  Substance and Sexual Activity  . Alcohol use: No  . Drug use: No  . Sexual activity: Never    Partners: Male    Birth control/protection: Surgical    Comment: hysterectomy  Other Topics Concern  . Not on file  Social History Narrative   Lives alone with 2 kitties.   Unemployed currently - she had a temporary job that ended in Dec 2018   Retired          Investment banker, operational of Radio broadcast assistant Strain:   . Difficulty of Paying Living Expenses:   Food Insecurity:   . Worried About Charity fundraiser in the Last Year:   . Arboriculturist in the Last Year:   Transportation Needs:   . Film/video editor  (Medical):   Marland Kitchen Lack of Transportation (Non-Medical):   Physical Activity:   . Days of Exercise per Week:   . Minutes of Exercise per Session:   Stress:   . Feeling of Stress :   Social Connections:   . Frequency of Communication with Friends and Family:   . Frequency of Social Gatherings with Friends and Family:   . Attends Religious Services:   . Active Member of Clubs or Organizations:   . Attends Archivist Meetings:   Marland Kitchen Marital Status:   Intimate Partner Violence:   . Fear of Current or Ex-Partner:   . Emotionally Abused:   Marland Kitchen Physically Abused:   . Sexually Abused:       PHYSICAL EXAM  Vitals:   11/18/19 1450  BP: (!) 153/86  Pulse: 71  Temp: (!) 97.4 F (36.3 C)  Weight: 266 lb (120.7 kg)  Height: 5\' 5"  (1.651 m)   Body mass index is 44.26 kg/m.  Generalized: Well developed, in no acute distress  Cardiology: normal rate and rhythm, no murmur noted Respiratory: clear to auscultation bilaterally  Neurological examination  Mentation: Alert oriented to time, place, history taking. Follows all commands speech and language fluent Cranial nerve II-XII: Pupils were equal round reactive to light. Extraocular movements were full, visual field were full on confrontational test. Facial sensation and strength were normal. Uvula tongue midline. Head turning and shoulder shrug  were normal and symmetric. Motor: The motor testing reveals 5 over  5 strength of all 4 extremities. Good symmetric motor tone is noted throughout.  Gait and station: Gait is normal.  No peripheral edema noted today on exam.  DIAGNOSTIC DATA (LABS, IMAGING, TESTING) - I reviewed patient records, labs, notes, testing and imaging myself where available.  No flowsheet data found.   Lab Results  Component Value Date   WBC 6.9 04/20/2017   HGB 12.5 04/20/2017   HCT 38.4 04/20/2017   MCV 87 04/20/2017   PLT 175 04/20/2017      Component Value Date/Time   NA 144 09/12/2017 1547   K 4.2  09/12/2017 1547   CL 102 09/12/2017 1547   CO2 23 09/12/2017 1547   GLUCOSE 127 (H) 09/12/2017 1547   GLUCOSE 90 04/18/2016 1729   BUN 27 09/12/2017 1547   CREATININE 1.74 (H) 09/12/2017 1547   CREATININE 1.53 (H) 04/18/2016 1729   CALCIUM 9.6 09/12/2017 1547   PROT 6.8 09/12/2017 1547   ALBUMIN 4.4 09/12/2017 1547   AST 17 09/12/2017 1547   ALT 14 09/12/2017 1547   ALKPHOS 93 09/12/2017 1547   BILITOT 0.3 09/12/2017 1547   GFRNONAA 31 (L) 09/12/2017 1547   GFRNONAA 37 (L) 04/18/2016 1729   GFRAA 36 (L) 09/12/2017 1547   GFRAA 42 (L) 04/18/2016 1729   Lab Results  Component Value Date   CHOL 154 09/12/2017   HDL 35 (L) 09/12/2017   LDLCALC 85 09/12/2017   TRIG 171 (H) 09/12/2017   CHOLHDL 4.4 09/12/2017   Lab Results  Component Value Date   HGBA1C 6.1 (H) 09/12/2017   Lab Results  Component Value Date   VITAMINB12 461 10/26/2013   Lab Results  Component Value Date   TSH 4.370 09/12/2017       ASSESSMENT AND PLAN 64 y.o. year old female  has a past medical history of Acute on chronic respiratory failure with hypoxia ([place]) (11/27/2014), Anxiety, Chronic kidney disease (CKD), stage III (moderate), CSA (central sleep apnea), Depression, Diabetes mellitus, GERD (gastroesophageal reflux disease), H/O breast biopsy (02/10/10), High cholesterol, Hyperlipidemia, Hypertension, Hyperthyroidism, LVH (left ventricular hypertrophy), OSA (obstructive sleep apnea), Pneumonia (04/04), RBBB, Sarcoidosis of lung ([place]), Sleep apnea, and Thyroid disease. here with     ICD-10-CM   1. OSA on CPAP  G47.33    Z99.89     Chandelle Dinga is doing very well on BiPAP therapy.  She has adjusted well to her new machine and denies any difficulty with usage.  Compliance report reveals excellent compliance.  She was encouraged to continue using BiPAP nightly and for greater than 4 hours each night.  We have discussed her concerns of swelling with both hands.  She noticed this around the same time she  started gaining weight.  She has admittedly been eating more fast food since getting a new home and being approved for disability.  She realizes that her blood pressures have been elevated.  I have advised that she follow-up closely with primary care.  I am not certain that this correlates with BiPAP therapy but we will continue to monitor closely.  She was advised to limit sodium intake.  She will keep an eye on her blood pressures at home.  She will call PCP to follow-up as her blood pressure is elevated today.  She will call for urgent medical attention with any chest pain, shortness of breath or trouble breathing.  She will return to see [place] in 1 year, sooner if needed.  She verbalizes understanding and agreement with this plan.  No orders of the defined types were placed in this encounter.    No orders of the defined types were placed in this encounter.     I spent 20 minutes with the patient. 50% of this time was spent counseling and educating patient on plan of care and medications.    [name], FNP-C 11/18/2019, 4:38 PM [facility] [address], [place], [place] [address] [phone]

## 2019-11-18 NOTE — Patient Instructions (Signed)
Please continue using your BiPAP regularly. While your insurance requires that you use BiPAP at least 4 hours each night on 70% of the nights, I recommend, that you not skip any nights and use it throughout the night if you can. Getting used to BiPAP and staying with the treatment long term does take time and patience and discipline. Untreated obstructive sleep apnea when it is moderate to severe can have an adverse impact on cardiovascular health and raise her risk for heart disease, arrhythmias, hypertension, congestive heart failure, stroke and diabetes. Untreated obstructive sleep apnea causes sleep disruption, nonrestorative sleep, and sleep deprivation. This can have an impact on your day to day functioning and cause daytime sleepiness and impairment of cognitive function, memory loss, mood disturbance, and problems focussing. Using BiPAP regularly can improve these symptoms.  Follow up closely with PCP regarding swelling. Make sure BP is well managed.   Follow up in 1 year, sooner if needed   CPAP and BPAP Information CPAP and BPAP are methods of helping a person breathe with the use of air pressure. CPAP stands for "continuous positive airway pressure." BPAP stands for "bi-level positive airway pressure." In both methods, air is blown through your nose or mouth and into your air passages to help you breathe well. CPAP and BPAP use different amounts of pressure to blow air. With CPAP, the amount of pressure stays the same while you breathe in and out. With BPAP, the amount of pressure is increased when you breathe in (inhale) so that you can take larger breaths. Your health care provider will recommend whether CPAP or BPAP would be more helpful for you. Why are CPAP and BPAP treatments used? CPAP or BPAP can be helpful if you have:  Sleep apnea.  Chronic obstructive pulmonary disease (COPD).  Heart failure.  Medical conditions that weaken the muscles of the chest including muscular  dystrophy, or neurological diseases such as amyotrophic lateral sclerosis (ALS).  Other problems that cause breathing to be weak, abnormal, or difficult. CPAP is most commonly used for obstructive sleep apnea (OSA) to keep the airways from collapsing when the muscles relax during sleep. How is CPAP or BPAP administered? Both CPAP and BPAP are provided by a small machine with a flexible plastic tube that attaches to a plastic mask. You wear the mask. Air is blown through the mask into your nose or mouth. The amount of pressure that is used to blow the air can be adjusted on the machine. Your health care provider will determine the pressure setting that should be used based on your individual needs. When should CPAP or BPAP be used? In most cases, the mask only needs to be worn during sleep. Generally, the mask needs to be worn throughout the night and during any daytime naps. People with certain medical conditions may also need to wear the mask at other times when they are awake. Follow instructions from your health care provider about when to use the machine. What are some tips for using the mask?   Because the mask needs to be snug, some people feel trapped or closed-in (claustrophobic) when first using the mask. If you feel this way, you may need to get used to the mask. One way to do this is by holding the mask loosely over your nose or mouth and then gradually applying the mask more snugly. You can also gradually increase the amount of time that you use the mask.  Masks are available in various types and sizes.  Some fit over your mouth and nose while others fit over just your nose. If your mask does not fit well, talk with your health care provider about getting a different one.  If you are using a mask that fits over your nose and you tend to breathe through your mouth, a chin strap may be applied to help keep your mouth closed.  The CPAP and BPAP machines have alarms that may sound if the mask  comes off or develops a leak.  If you have trouble with the mask, it is very important that you talk with your health care provider about finding a way to make the mask easier to tolerate. Do not stop using the mask. Stopping the use of the mask could have a negative impact on your health. What are some tips for using the machine?  Place your CPAP or BPAP machine on a secure table or stand near an electrical outlet.  Know where the on/off switch is located on the machine.  Follow instructions from your health care provider about how to set the pressure on your machine and when you should use it.  Do not eat or drink while the CPAP or BPAP machine is on. Food or fluids could get pushed into your lungs by the pressure of the CPAP or BPAP.  Do not smoke. Tobacco smoke residue can damage the machine.  For home use, CPAP and BPAP machines can be rented or purchased through home health care companies. Many different brands of machines are available. Renting a machine before purchasing may help you find out which particular machine works well for you.  Keep the CPAP or BPAP machine and attachments clean. Ask your health care provider for specific instructions. Get help right away if:  You have redness or open areas around your nose or mouth where the mask fits.  You have trouble using the CPAP or BPAP machine.  You cannot tolerate wearing the CPAP or BPAP mask.  You have pain, discomfort, and bloating in your abdomen. Summary  CPAP and BPAP are methods of helping a person breathe with the use of air pressure.  Both CPAP and BPAP are provided by a small machine with a flexible plastic tube that attaches to a plastic mask.  If you have trouble with the mask, it is very important that you talk with your health care provider about finding a way to make the mask easier to tolerate. This information is not intended to replace advice given to you by your health care provider. Make sure you  discuss any questions you have with your health care provider. Document Revised: 11/07/2018 Document Reviewed: 06/06/2016 [employer] Patient Education  [place].

## 2020-08-08 ENCOUNTER — Other Ambulatory Visit: Payer: Self-pay

## 2020-08-08 ENCOUNTER — Other Ambulatory Visit: Payer: Self-pay | Admitting: Cardiovascular Disease

## 2020-08-14 ENCOUNTER — Encounter: Payer: Self-pay | Admitting: Cardiovascular Disease

## 2020-08-14 ENCOUNTER — Other Ambulatory Visit: Payer: Self-pay

## 2020-08-14 ENCOUNTER — Ambulatory Visit (INDEPENDENT_AMBULATORY_CARE_PROVIDER_SITE_OTHER): Payer: Medicaid Other | Admitting: Cardiovascular Disease

## 2020-08-14 VITALS — BP 150/70 | HR 81 | Ht 65.0 in | Wt 261.4 lb

## 2020-08-14 DIAGNOSIS — E782 Mixed hyperlipidemia: Secondary | ICD-10-CM

## 2020-08-14 DIAGNOSIS — K559 Vascular disorder of intestine, unspecified: Secondary | ICD-10-CM

## 2020-08-14 DIAGNOSIS — N1832 Chronic kidney disease, stage 3b: Secondary | ICD-10-CM

## 2020-08-14 DIAGNOSIS — I10 Essential (primary) hypertension: Secondary | ICD-10-CM

## 2020-08-14 DIAGNOSIS — E1165 Type 2 diabetes mellitus with hyperglycemia: Secondary | ICD-10-CM

## 2020-08-14 DIAGNOSIS — Z794 Long term (current) use of insulin: Secondary | ICD-10-CM

## 2020-08-14 DIAGNOSIS — Z9989 Dependence on other enabling machines and devices: Secondary | ICD-10-CM

## 2020-08-14 DIAGNOSIS — IMO0002 Reserved for concepts with insufficient information to code with codable children: Secondary | ICD-10-CM

## 2020-08-14 DIAGNOSIS — E118 Type 2 diabetes mellitus with unspecified complications: Secondary | ICD-10-CM

## 2020-08-14 DIAGNOSIS — G4733 Obstructive sleep apnea (adult) (pediatric): Secondary | ICD-10-CM

## 2020-08-14 MED ORDER — LABETALOL HCL 100 MG PO TABS: 100.0000 mg | ORAL_TABLET | Freq: Three times a day (TID) | ORAL | 1 refills | Status: DC

## 2020-08-14 MED ORDER — LABETALOL HCL 100 MG PO TABS: 100.0000 mg | ORAL_TABLET | Freq: Three times a day (TID) | ORAL | 5 refills | Status: DC

## 2020-08-14 NOTE — Progress Notes (Addendum)
Cardiology Office Note    Date:  08/16/2020   ID:  Rachel Kim, DOB [birth year], MRN [id number]  PCP:  Rachel Sakai, DO  Cardiologist:   Rachel Klein, MD   Chief Complaint  Patient presents with  . Hyperlipidemia    History of Present Illness:  Rachel Kim is a 65 y.o. female with morbid obesity associated with multiple complications including hypertension, diabetes mellitus requiring insulin, hyperlipidemia and complex central and obstructive sleep apnea.  She has a new home and is in considerably better spirits than last time we met.  On the other hand she had a bad case of influenza in December evaluated in the emergency room.  She tested negative for COVID.  Glycemic control has deteriorated and her most recent hemoglobin A1c was 13.8% in November.  She has not had a lipid profile since April when her LDL was excellent at 61, but she had moderate hypertriglyceridemia at 398 and a low HDL of 37.  Blood pressure control has also deteriorated monitor shows values of 154/78-181/94.  However, when we compare this with our office monitor, her home monitor appears to be overestimating her true blood pressure by about 30 points, possibly because the cuff is too small for her arm; it is also a very small device with a very weak motor that takes several minutes to fully inflate.  Clinically she feels well.  She denies change in her exertional dyspnea which remains functional class II, occasionally class III.  Sometimes she has shortness of breath when making the bed.  She denies lower extremity edema.  She does not have palpitations, dizziness or syncope.  She denies orthopnea or PND and chest pain.  She has not had any focal neurological complaints or intermittent claudication.  In August 2020 she had an episode of ischemic colitis and it was recommended for her to have coronary evaluation: She had a normal nuclear stress test in December 2020.  She has chronic kidney disease and  her nephrologist is Dr. Edrick Kim.  On July 12, 2021 her creatinine was 1.65 (estimated GFR 35).  She has extensive problems with psoriasis and psoriatic arthritis.  She is receiving Humira.  She reports 100% compliance with CPAP and she denies daytime hypersomnolence.   Past Medical History:  Diagnosis Date  . Acute on chronic respiratory failure with hypoxia ([place]) 11/27/2014  . Anxiety   . Chronic kidney disease (CKD), stage III (moderate) (HCC)   . CSA (central sleep apnea)   . Depression   . Diabetes mellitus   . GERD (gastroesophageal reflux disease)   . H/O breast biopsy 02/10/10   calcification , no tumors  . High cholesterol   . Hyperlipidemia   . Hypertension   . Hyperthyroidism    diagnosed 1989  . LVH (left ventricular hypertrophy)    echo 02/25/10 EF >55%  . OSA (obstructive sleep apnea)    severe complex apnea  . Pneumonia 04/04  . RBBB   . Sarcoidosis of lung ([place])   . Sleep apnea    uses CPAP  . Thyroid disease    hypothyroidism    Past Surgical History:  Procedure Laterality Date  . ABDOMINAL HYSTERECTOMY  12/07  . APPENDECTOMY  1970  . CARPAL TUNNEL RELEASE     right and left  . CHOLECYSTECTOMY  1998  . ELBOW FRACTURE SURGERY     with pins and pins removed  . laminectomy c5-c6  2001  . laminectomy l5, s1  [place]  . RETROPERITONEAL LYMPH NODE EXCISION     right underarm  . RHINOPLASTY  1985   has had several revisions  . rt knee arthroscopy  1997  . TONSILLECTOMY  1960  . tube placement L ear Left 07/29/2019  . tubinates trimmed  1986    Current Medications: Outpatient Medications Prior to Visit  Medication Sig Dispense Refill  . Adalimumab 40 MG/0.4ML PNKT Inject 40 mg into the skin every 14 (fourteen) days.    Marland Kitchen aspirin 81 MG tablet Take 1 tablet (81 mg total) by mouth daily. 30 tablet 0  . atorvastatin (LIPITOR) 20 MG tablet TAKE 1 TABLET BY MOUTH DAILY. 90 tablet 3  . benazepril (LOTENSIN) 20 MG tablet TAKE 1 TABLET BY MOUTH  ONCE DAILY 90 tablet 1  . busPIRone (BUSPAR) 10 MG tablet Take 10 mg by mouth 3 (three) times daily.    . Cholecalciferol (VITAMIN D) 2000 UNITS CAPS Take by mouth daily.    . clobetasol (TEMOVATE) 0.05 % external solution Apply 1 application topically as needed.    . Dulaglutide (TRULICITY) 1.5 WV/3.7TG SOPN Inject into the skin.    . hydrALAZINE (APRESOLINE) 100 MG tablet Take 1 tablet (100 mg total) 3 (three) times daily by mouth. 270 tablet 3  . indapamide (LOZOL) 1.25 MG tablet Take 1 tablet (1.25 mg total) by mouth daily. 90 tablet 1  . insulin lispro protamine-lispro (HUMALOG 75/25 MIX) (75-25) 100 UNIT/ML SUSP injection Inject into the skin.    Marland Kitchen levothyroxine (SYNTHROID, LEVOTHROID) 125 MCG tablet Take 1 tablet (125 mcg total) by mouth daily before breakfast. 90 tablet 1  . Melatonin 1 MG TABS Take 0.5-1 mg by mouth at bedtime.     . Multiple Vitamins-Minerals (PRESERVISION AREDS 2 PO) Take 2 capsules by mouth daily.     Vladimir Faster Glycol-Propyl Glycol (SYSTANE) 0.4-0.3 % SOLN Place 2 drops into both eyes 2 (two) times daily.    . polyethylene glycol (MIRALAX / GLYCOLAX) 17 g packet Take 17 g by mouth as needed.    . labetalol (NORMODYNE) 100 MG tablet Take 1 tablet (100 mg total) by mouth 2 (two) times daily. 180 tablet 1  . sulfaSALAzine (AZULFIDINE) 500 MG tablet Take by mouth. 500 mg daily and 1000 mg at bedtime    . UNABLE TO FIND Take 1 capsule by mouth in the morning and at bedtime. Med Name: SONUS complete (herbs)    . acetaminophen (TYLENOL) 650 MG CR tablet Take 650 mg 2 (two) times daily by mouth.    . escitalopram (LEXAPRO) 20 MG tablet TAKE ONE AND ONE-HALF (1 AND1/2) TABLET BY MOUTH DAILY 45 tablet 0  . FLUTICASONE PROPIONATE NA Place 2 puffs into the nose daily.    Marland Kitchen MAGNESIUM OXIDE PO Takes 250 mg x 2 daily     No facility-administered medications prior to visit.     Allergies:   Doxazosin, Labetalol, Actos [pioglitazone hydrochloride], Amlodipine, Cozaar [losartan  potassium], Glipizide, Imdur [isosorbide dinitrate], and Clindamycin hcl   Social History   Socioeconomic History  . Marital status: Single    Spouse name: n/a  . Number of children: 0  . Years of education: Master's  . Highest education level: Not on file  Occupational History  . Occupation: Corporate investment banker  Tobacco Use  . Smoking status: Never Smoker  . Smokeless tobacco: Never Used  Vaping Use  . Vaping Use: Never used  Substance and Sexual Activity  . Alcohol use: No  . Drug  use: No  . Sexual activity: Never    Partners: Male    Birth control/protection: Surgical    Comment: hysterectomy  Other Topics Concern  . Not on file  Social History Narrative   Lives alone with 2 kitties.   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   ROS:   Please see the history of present illness.    ROS All other systems are reviewed and are negative.  PHYSICAL EXAM:   VS:  BP (!) 150/70   Pulse 81   Ht $R'5\' 5"'wj$  (1.651 m)   Wt 261 lb 6.4 oz (118.6 kg)   LMP 07/01/2006   SpO2 97%   BMI 43.50 kg/m   General: Alert, oriented x3, no distress, morbidly obese Head: no evidence of trauma, PERRL, EOMI, no exophtalmos or lid lag, no myxedema, no xanthelasma; normal ears, nose and oropharynx Neck: normal jugular venous pulsations and no hepatojugular reflux; brisk carotid pulses without delay and no carotid bruits Chest: clear to auscultation, no signs of consolidation by  percussion or palpation, normal fremitus, symmetrical and full respiratory excursions Cardiovascular: normal position and quality of the apical impulse, regular rhythm, normal first and widely split second heart sounds, no murmurs, rubs or gallops Abdomen: no tenderness or distention, no masses by palpation, no abnormal pulsatility or arterial bruits, normal bowel sounds, no hepatosplenomegaly Extremities: no clubbing, cyanosis or edema; 2+ radial, ulnar and brachial pulses bilaterally; 2+ right femoral, posterior tibial and dorsalis pedis pulses; 2+ left femoral, posterior tibial and dorsalis pedis pulses; no subclavian or femoral bruits Neurological: grossly nonfocal Psych: Normal mood and affect    Wt Readings from Last 3 Encounters:  08/14/20 261 lb 6.4 oz (118.6 kg)  11/18/19 266 lb (120.7 kg)  07/11/19 252 lb (114.3 kg)      Studies/Labs Reviewed:   ECHO (05/23/2018 [facility]) The left ventricle is normal in size. There is moderate concentric left ventricular hypertrophy. The left ventricular ejection fraction is normal (55-60%). The left ventricular wall motion is normal. Grade I mild diastolic dysfunction; abnormal relaxation pattern. The left atrium is mildly dilated. The tricuspid valve leaflets are structurally normal with trace regurgitation. Estimation of right ventricular systolic pressure is not possible. The aortic valve is not well visualized, but is grossly normal. The aortic valve is trileaflet.  EKG:  EKG is ordered today.  It was unchanged from previous tracing, showing normal sinus rhythm and chronic bifascicular block (RBBB plus LAFB).  No ischemic repolarization changes.  Normal QT when adjusted for conduction abnormality.  Recent Labs: No results found for requested labs within last 8760 hours.  Care everywhere  03/08/2019 Hemoglobin A1c 4.8% 03/29/2019 Glucose 140, BUN 30, creatinine 1.79, potassium 4.1, normal LFTs 04/04/2019 Creatinine 1.91t, normal  LFTs 06/15/2020 hemoglobin A1c 13.8% 08/12/2020 Creatinine 1.65 (GFR 35)   Lipid Panel    Component Value Date/Time   CHOL 154 09/12/2017 1547   TRIG 171 (  H) 09/12/2017 1547   HDL 35 (L) 09/12/2017 1547   CHOLHDL 4.4 09/12/2017 1547   CHOLHDL 5.1 (H) 04/18/2016 1729   VLDL 34 (H) 04/18/2016 1729   LDLCALC 85 09/12/2017 1547  09/14/2018 Total cholesterol 160, triglycerides 187, HDL 40, LDL 83 05/23/2018 Total cholesterol 110, triglycerides 128 HDL 36, LDL 48 11/05/2019 Total cholesterol 159, HDL 37, LDL 60, triglycerides 398  ASSESSMENT:    1. Essential hypertension   2. Mixed hyperlipidemia   3. Uncontrolled diabetes mellitus with complication, with long-term current use of insulin ([place])   4. Morbid obesity ([place])   5. Stage 3b chronic kidney disease ([place])   6. OSA on CPAP   7. Ischemic colitis ([place])      PLAN:  In order of problems listed above:  1. HTN: I do not think her home blood pressure monitor is accurate.  She needs a larger cuff (at least size 12, probably a size 13) and a pump that can be inflated faster to avoid excessive engorgement of her arm during cuff inflation.  We will try to get her a better piece of equipment.  Having said that, her blood pressure is indeed above target and I ended increasing benazepril to 40 mg daily and also increase the labetalol to 3 times daily which will provide a smoother blood pressure curve.  Continue indapamide and hydralazine.  Avoid higher doses of thiazide since this will interfere both with renal function and glycemic control. 2. HLP: Excellent LDL on statin.  The elevation in triglycerides is due to poor glycemic control. 3. DM: There has been a tremendous worsening of her glycemic control but I do not think can be attributed to her recent episode of flu, but may be related to poor compliance with dietary restrictions.  I am very pleased that she is on a GLP-1 agonist.  She is to follow-up with her endocrinologist. 4. CKD:  Renal function appears to be stable at a GFR of approximately 35. 5. Morbid obesity: If she can get insurance coverage, she has expressed interest in bariatric surgery. 6. OSA: She is compliant with CPAP.  The daytime hypersomnolence appears to be a less significant complaints today. 7. Ischemic colitis: The presence of PAD increases the likelihood that she has coronary problems, but she does not have angina and there was no evidence of coronary insufficiency on her nuclear stress test about a year ago.  The focus remains on risk factor modification.   Medication Adjustments/Labs and Tests Ordered: Current medicines are reviewed at length with the patient today.  Concerns regarding medicines are outlined above.  Medication changes, Labs and Tests ordered today are listed in the Patient Instructions below. Patient Instructions   Medication Instructions:  INCREASE the Labetalol to 100 mg three times a day  Dr. Sallyanne Kuster would like you to check your blood pressure daily for the next  week.  Keep a journal of these daily blood pressure and heart rate readings and call our office or send a message through [place] with the results. Thank you!  It is best to check your BP 1-2 hours after taking your medications to see the medications effectiveness on your BP.    Here are some tips that our clinical pharmacists share for home BP monitoring:          Rest 5 minutes before taking your blood pressure.          Don't smoke or drink caffeinated beverages for at least 30 minutes before.  Take your blood pressure before (not after) you eat.          Sit comfortably with your back supported and both feet on the floor (don't cross your legs).          Elevate your arm to heart level on a table or a desk.          Use the proper sized cuff. It should fit smoothly and snugly around your bare upper arm. There should be enough room to slip a fingertip under the cuff. The bottom edge of the cuff should  be 1 inch above the crease of the elbow.   *If you need a refill on your cardiac medications before your next appointment, please call your pharmacy*   Lab Work: None ordered If you have labs (blood work) drawn today and your tests are completely normal, you will receive your results only by: Marland Kitchen MyChart Message (if you have MyChart) OR . A paper copy in the mail If you have any lab test that is abnormal or we need to change your treatment, we will call you to review the results.   Testing/Procedures: None ordered   Follow-Up: At [facility], you and your health needs are our priority.  As part of our continuing mission to provide you with exceptional heart care, we have created designated Provider Care Teams.  These Care Teams include your primary Cardiologist (physician) and Advanced Practice Providers (APPs -  Physician Assistants and Nurse Practitioners) who all work together to provide you with the care you need, when you need it.  We recommend signing up for the patient portal called "MyChart".  Sign up information is provided on this After Visit Summary.  MyChart is used to connect with patients for Virtual Visits (Telemedicine).  Patients are able to view lab/test results, encounter notes, upcoming appointments, etc.  Non-urgent messages can be sent to your provider as well.   To learn more about what you can do with MyChart, go to [email or web address].    Your next appointment:   6 month(s)  The format for your next appointment:   In Person  Provider:   You may see Rachel Klein, MD or one of the following Advanced Practice Providers on your designated Care Team:    Almyra Deforest, PA-C  Fabian Sharp, [place] or   Roby Lofts, [place]    Other Instructions  Diabetes Mellitus Basics  Diabetes mellitus, or diabetes, is a long-term (chronic) disease. It occurs when the body does not properly use sugar (glucose) that is released from food after you eat. Diabetes mellitus  may be caused by one or both of these problems:  Your pancreas does not make enough of a hormone called insulin.  Your body does not react in a normal way to the insulin that it makes. Insulin lets glucose enter cells in your body. This gives you energy. If you have diabetes, glucose cannot get into cells. This causes high blood glucose (hyperglycemia). How to treat and manage diabetes You may need to take insulin or other diabetes medicines daily to keep your glucose in balance. If you are prescribed insulin, you will learn how to give yourself insulin by injection. You may need to adjust the amount of insulin you take based on the foods that you eat. You will need to check your blood glucose levels using a glucose monitor as told by your health care provider. The readings can help determine if you have low or high blood glucose. Generally,  you should have these blood glucose levels:  Before meals (preprandial): 80-130 mg/dL (4.4-7.2 mmol/L).  After meals (postprandial): below 180 mg/dL (10 mmol/L).  Hemoglobin A1c (HbA1c) level: less than 7%. Your health care provider will set treatment goals for you. Keep all follow-up visits. This is important. Follow these instructions at home: Diabetes medicines Take your diabetes medicines every day as told by your health care provider. List your diabetes medicines here:  Name of medicine: ______________________________ ? Amount (dose): _______________ Time (a.m./p.m.): _______________ Notes: ___________________________________  Name of medicine: ______________________________ ? Amount (dose): _______________ Time (a.m./p.m.): _______________ Notes: ___________________________________  Name of medicine: ______________________________ ? Amount (dose): _______________ Time (a.m./p.m.): _______________ Notes: ___________________________________ Insulin If you use insulin, list the types of insulin you use here:  Insulin type:  ______________________________ ? Amount (dose): _______________ Time (a.m./p.m.): _______________Notes: ___________________________________  Insulin type: ______________________________ ? Amount (dose): _______________ Time (a.m./p.m.): _______________ Notes: ___________________________________  Insulin type: ______________________________ ? Amount (dose): _______________ Time (a.m./p.m.): _______________ Notes: ___________________________________  Insulin type: ______________________________ ? Amount (dose): _______________ Time (a.m./p.m.): _______________ Notes: ___________________________________  Insulin type: ______________________________ ? Amount (dose): _______________ Time (a.m./p.m.): _______________ Notes: ___________________________________ Managing blood glucose Check your blood glucose levels using a glucose monitor as told by your health care provider. Write down the times that you check your glucose levels here:  Time: _______________ Notes: ___________________________________  Time: _______________ Notes: ___________________________________  Time: _______________ Notes: ___________________________________  Time: _______________ Notes: ___________________________________  Time: _______________ Notes: ___________________________________  Time: _______________ Notes: ___________________________________   Low blood glucose Low blood glucose (hypoglycemia) is when glucose is at or below 70 mg/dL (3.9 mmol/L). Symptoms may include:  Feeling: ? Hungry. ? Sweaty and clammy. ? Irritable or easily upset. ? Dizzy. ? Sleepy.  Having: ? A fast heartbeat. ? A headache. ? A change in your vision. ? Numbness around the mouth, lips, or tongue.  Having trouble with: ? Moving (coordination). ? Sleeping. Treating low blood glucose To treat low blood glucose, eat or drink something containing sugar right away. If you can think clearly and swallow safely, follow the 15:15  rule:  Take 15 grams of a fast-acting carb (carbohydrate), as told by your health care provider.  Some fast-acting carbs are: ? Glucose tablets: take 3-4 tablets. ? Hard candy: eat 3-5 pieces. ? Fruit juice: drink 4 oz (120 mL). ? Regular (not diet) soda: drink 4-6 oz (120-180 mL). ? Honey or sugar: eat 1 Tbsp (15 mL).  Check your blood glucose levels 15 minutes after you take the carb.  If your glucose is still at or below 70 mg/dL (3.9 mmol/L), take 15 grams of a carb again.  If your glucose does not go above 70 mg/dL (3.9 mmol/L) after 3 tries, get help right away.  After your glucose goes back to normal, eat a meal or a snack within 1 hour. Treating very low blood glucose If your glucose is at or below 54 mg/dL (3 mmol/L), you have very low blood glucose (severe hypoglycemia). This is an emergency. Do not wait to see if the symptoms will go away. Get medical help right away. Call your local emergency services (911 in the [place] Do not drive yourself to the hospital. Questions to ask your health care provider  Should I talk with a diabetes educator?  What equipment will I need to care for myself at home?  What diabetes medicines do I need? When should I take them?  How often do I need to check my blood glucose levels?  What number can  I call if I have questions?  When is my follow-up visit?  Where can I find a support group for people with diabetes? Where to find more information  American Diabetes Association: www.diabetes.org  Association of Diabetes Care and Education Specialists: www.diabeteseducator.org Contact a health care provider if:  Your blood glucose is at or above 240 mg/dL (13.3 mmol/L) for 2 days in a row.  You have been sick or have had a fever for 2 days or more, and you are not getting better.  You have any of these problems for more than 6 hours: ? You cannot eat or drink. ? You feel nauseous. ? You vomit. ? You have diarrhea. Get help right  away if:  Your blood glucose is lower than 54 mg/dL (3 mmol/L).  You get confused.  You have trouble thinking clearly.  You have trouble breathing. These symptoms may represent a serious problem that is an emergency. Do not wait to see if the symptoms will go away. Get medical help right away. Call your local emergency services (911 in the [place] Do not drive yourself to the hospital. Summary  Diabetes mellitus is a chronic disease that occurs when the body does not properly use sugar (glucose) that is released from food after you eat.  Take insulin and diabetes medicines as told.  Check your blood glucose every day, as often as told.  Keep all follow-up visits. This is important. This information is not intended to replace advice given to you by your health care provider. Make sure you discuss any questions you have with your health care provider. Document Revised: 11/19/2019 Document Reviewed: 11/19/2019 Elsevier Patient Education  2021 [place], Rachel Klein, MD  08/16/2020 11:49 AM    [facility] [address], [place], [place]  [address] Phone: [phone]; Fax: [phone]

## 2020-08-14 NOTE — Patient Instructions (Addendum)
Medication Instructions:  INCREASE the Labetalol to 100 mg three times a day  Dr. Sallyanne Kuster would like you to check your blood pressure daily for the next  week.  Keep a journal of these daily blood pressure and heart rate readings and call our office or send a message through [facility] with the results. Thank you!  It is best to check your BP 1-2 hours after taking your medications to see the medications effectiveness on your BP.    Here are some tips that our clinical pharmacists share for home BP monitoring:          Rest 5 minutes before taking your blood pressure.          Don't smoke or drink caffeinated beverages for at least 30 minutes before.          Take your blood pressure before (not after) you eat.          Sit comfortably with your back supported and both feet on the floor (don't cross your legs).          Elevate your arm to heart level on a table or a desk.          Use the proper sized cuff. It should fit smoothly and snugly around your bare upper arm. There should be enough room to slip a fingertip under the cuff. The bottom edge of the cuff should be 1 inch above the crease of the elbow.   *If you need a refill on your cardiac medications before your next appointment, please call your pharmacy*   Lab Work: None ordered If you have labs (blood work) drawn today and your tests are completely normal, you will receive your results only by: [facility] MyChart Message (if you have MyChart) OR . A paper copy in the mail If you have any lab test that is abnormal or we need to change your treatment, we will call you to review the results.   Testing/Procedures: None ordered   Follow-Up: At [facility], you and your health needs are our priority.  As part of our continuing mission to provide you with exceptional heart care, we have created designated Provider Care Teams.  These Care Teams include your primary Cardiologist (physician) and Advanced Practice Providers (APPs -   Physician Assistants and Nurse Practitioners) who all work together to provide you with the care you need, when you need it.  We recommend signing up for the patient portal called "MyChart".  Sign up information is provided on this After Visit Summary.  MyChart is used to connect with patients for Virtual Visits (Telemedicine).  Patients are able to view lab/test results, encounter notes, upcoming appointments, etc.  Non-urgent messages can be sent to your provider as well.   To learn more about what you can do with MyChart, go to [email or web address].    Your next appointment:   6 month(s)  The format for your next appointment:   In Person  Provider:   You may see Sanda Klein, MD or one of the following Advanced Practice Providers on your designated Care Team:    Almyra Deforest, PA-C  Fabian Sharp, [place] or   Roby Lofts, [place]    Other Instructions  Diabetes Mellitus Basics  Diabetes mellitus, or diabetes, is a long-term (chronic) disease. It occurs when the body does not properly use sugar (glucose) that is released from food after you eat. Diabetes mellitus may be caused by one or both of these problems:  Your pancreas  does not make enough of a hormone called insulin.  Your body does not react in a normal way to the insulin that it makes. Insulin lets glucose enter cells in your body. This gives you energy. If you have diabetes, glucose cannot get into cells. This causes high blood glucose (hyperglycemia). How to treat and manage diabetes You may need to take insulin or other diabetes medicines daily to keep your glucose in balance. If you are prescribed insulin, you will learn how to give yourself insulin by injection. You may need to adjust the amount of insulin you take based on the foods that you eat. You will need to check your blood glucose levels using a glucose monitor as told by your health care provider. The readings can help determine if you have low or high blood  glucose. Generally, you should have these blood glucose levels:  Before meals (preprandial): 80-130 mg/dL (4.4-7.2 mmol/L).  After meals (postprandial): below 180 mg/dL (10 mmol/L).  Hemoglobin A1c (HbA1c) level: less than 7%. Your health care provider will set treatment goals for you. Keep all follow-up visits. This is important. Follow these instructions at home: Diabetes medicines Take your diabetes medicines every day as told by your health care provider. List your diabetes medicines here:  Name of medicine: ______________________________ ? Amount (dose): _______________ Time (a.m./p.m.): _______________ Notes: ___________________________________  Name of medicine: ______________________________ ? Amount (dose): _______________ Time (a.m./p.m.): _______________ Notes: ___________________________________  Name of medicine: ______________________________ ? Amount (dose): _______________ Time (a.m./p.m.): _______________ Notes: ___________________________________ Insulin If you use insulin, list the types of insulin you use here:  Insulin type: ______________________________ ? Amount (dose): _______________ Time (a.m./p.m.): _______________Notes: ___________________________________  Insulin type: ______________________________ ? Amount (dose): _______________ Time (a.m./p.m.): _______________ Notes: ___________________________________  Insulin type: ______________________________ ? Amount (dose): _______________ Time (a.m./p.m.): _______________ Notes: ___________________________________  Insulin type: ______________________________ ? Amount (dose): _______________ Time (a.m./p.m.): _______________ Notes: ___________________________________  Insulin type: ______________________________ ? Amount (dose): _______________ Time (a.m./p.m.): _______________ Notes: ___________________________________ Managing blood glucose Check your blood glucose levels using a glucose monitor as  told by your health care provider. Write down the times that you check your glucose levels here:  Time: _______________ Notes: ___________________________________  Time: _______________ Notes: ___________________________________  Time: _______________ Notes: ___________________________________  Time: _______________ Notes: ___________________________________  Time: _______________ Notes: ___________________________________  Time: _______________ Notes: ___________________________________   Low blood glucose Low blood glucose (hypoglycemia) is when glucose is at or below 70 mg/dL (3.9 mmol/L). Symptoms may include:  Feeling: ? Hungry. ? Sweaty and clammy. ? Irritable or easily upset. ? Dizzy. ? Sleepy.  Having: ? A fast heartbeat. ? A headache. ? A change in your vision. ? Numbness around the mouth, lips, or tongue.  Having trouble with: ? Moving (coordination). ? Sleeping. Treating low blood glucose To treat low blood glucose, eat or drink something containing sugar right away. If you can think clearly and swallow safely, follow the 15:15 rule:  Take 15 grams of a fast-acting carb (carbohydrate), as told by your health care provider.  Some fast-acting carbs are: ? Glucose tablets: take 3-4 tablets. ? Hard candy: eat 3-5 pieces. ? Fruit juice: drink 4 oz (120 mL). ? Regular (not diet) soda: drink 4-6 oz (120-180 mL). ? Honey or sugar: eat 1 Tbsp (15 mL).  Check your blood glucose levels 15 minutes after you take the carb.  If your glucose is still at or below 70 mg/dL (3.9 mmol/L), take 15 grams of a carb again.  If your glucose does not go above 70  mg/dL (3.9 mmol/L) after 3 tries, get help right away.  After your glucose goes back to normal, eat a meal or a snack within 1 hour. Treating very low blood glucose If your glucose is at or below 54 mg/dL (3 mmol/L), you have very low blood glucose (severe hypoglycemia). This is an emergency. Do not wait to see if the  symptoms will go away. Get medical help right away. Call your local emergency services (911 in the [place] Do not drive yourself to the hospital. Questions to ask your health care provider  Should I talk with a diabetes educator?  What equipment will I need to care for myself at home?  What diabetes medicines do I need? When should I take them?  How often do I need to check my blood glucose levels?  What number can I call if I have questions?  When is my follow-up visit?  Where can I find a support group for people with diabetes? Where to find more information  American Diabetes Association: www.diabetes.org  Association of Diabetes Care and Education Specialists: www.diabeteseducator.org Contact a health care provider if:  Your blood glucose is at or above 240 mg/dL (13.3 mmol/L) for 2 days in a row.  You have been sick or have had a fever for 2 days or more, and you are not getting better.  You have any of these problems for more than 6 hours: ? You cannot eat or drink. ? You feel nauseous. ? You vomit. ? You have diarrhea. Get help right away if:  Your blood glucose is lower than 54 mg/dL (3 mmol/L).  You get confused.  You have trouble thinking clearly.  You have trouble breathing. These symptoms may represent a serious problem that is an emergency. Do not wait to see if the symptoms will go away. Get medical help right away. Call your local emergency services (911 in the [place] Do not drive yourself to the hospital. Summary  Diabetes mellitus is a chronic disease that occurs when the body does not properly use sugar (glucose) that is released from food after you eat.  Take insulin and diabetes medicines as told.  Check your blood glucose every day, as often as told.  Keep all follow-up visits. This is important. This information is not intended to replace advice given to you by your health care provider. Make sure you discuss any questions you have with your  health care provider. Document Revised: 11/19/2019 Document Reviewed: 11/19/2019 Elsevier Patient Education  2021 [employer].

## 2020-08-16 ENCOUNTER — Encounter: Payer: Self-pay | Admitting: Cardiovascular Disease

## 2020-08-18 ENCOUNTER — Other Ambulatory Visit: Payer: Self-pay | Admitting: *Deleted

## 2020-08-18 DIAGNOSIS — I10 Essential (primary) hypertension: Secondary | ICD-10-CM

## 2020-08-18 MED ORDER — BENAZEPRIL HCL 40 MG PO TABS: ORAL_TABLET | ORAL | Status: DC

## 2020-08-21 ENCOUNTER — Encounter: Payer: Self-pay | Admitting: Family Medicine

## 2020-08-26 ENCOUNTER — Encounter: Payer: Self-pay | Admitting: *Deleted

## 2020-09-02 NOTE — Telephone Encounter (Signed)
Considering that the home BP cuff seemed to overestimate the SBP by about 30 mmHg, I like that BP also. Stay on current meds please.

## 2020-10-22 DIAGNOSIS — R5383 Other fatigue: Secondary | ICD-10-CM | POA: Insufficient documentation

## 2020-11-17 ENCOUNTER — Ambulatory Visit: Payer: Medicaid Other | Admitting: Family Medicine

## 2020-11-17 ENCOUNTER — Encounter: Payer: Self-pay | Admitting: Family Medicine

## 2020-11-17 VITALS — BP 101/63 | HR 75 | Ht 65.0 in | Wt 241.0 lb

## 2020-11-17 DIAGNOSIS — G4711 Idiopathic hypersomnia with long sleep time: Secondary | ICD-10-CM | POA: Diagnosis not present

## 2020-11-17 DIAGNOSIS — R063 Periodic breathing: Secondary | ICD-10-CM

## 2020-11-17 DIAGNOSIS — G4733 Obstructive sleep apnea (adult) (pediatric): Secondary | ICD-10-CM | POA: Diagnosis not present

## 2020-11-17 NOTE — Patient Instructions (Addendum)
Please continue using your BiPAP regularly. While your insurance requires that you use BiPAP at least 4 hours each night on 70% of the nights, I recommend, that you not skip any nights and use it throughout the night if you can. Getting used to BiPAP and staying with the treatment long term does take time and patience and discipline. Untreated obstructive sleep apnea when it is moderate to severe can have an adverse impact on cardiovascular health and raise her risk for heart disease, arrhythmias, hypertension, congestive heart failure, stroke and diabetes. Untreated obstructive sleep apnea causes sleep disruption, nonrestorative sleep, and sleep deprivation. This can have an impact on your day to day functioning and cause daytime sleepiness and impairment of cognitive function, memory loss, mood disturbance, and problems focussing. Using BiPAP regularly can improve these symptoms.   Continue working on healthy lifestyle habits. I am so proud of you for your weight loss efforts. Keep up the good work. I will send orders for a mask refitting and supplies. Continue close follow up with PCP for co morbidity management.   Follow up with me in 1 year    Sleep Apnea Sleep apnea affects breathing during sleep. It causes breathing to stop for a short time or to become shallow. It can also increase the risk of:  Heart attack.  Stroke.  Being very overweight (obese).  Diabetes.  Heart failure.  Irregular heartbeat. The goal of treatment is to help you breathe normally again. What are the causes? There are three kinds of sleep apnea:  Obstructive sleep apnea. This is caused by a blocked or collapsed airway.  Central sleep apnea. This happens when the brain does not send the right signals to the muscles that control breathing.  Mixed sleep apnea. This is a combination of obstructive and central sleep apnea. The most common cause of this condition is a collapsed or blocked airway. This can happen  if:  Your throat muscles are too relaxed.  Your tongue and tonsils are too large.  You are overweight.  Your airway is too small.   What increases the risk?  Being overweight.  Smoking.  Having a small airway.  Being older.  Being female.  Drinking alcohol.  Taking medicines to calm yourself (sedatives or tranquilizers).  Having family members with the condition. What are the signs or symptoms?  Trouble staying asleep.  Being sleepy or tired during the day.  Getting angry a lot.  Loud snoring.  Headaches in the morning.  Not being able to focus your mind (concentrate).  Forgetting things.  Less interest in sex.  Mood swings.  Personality changes.  Feelings of sadness (depression).  Waking up a lot during the night to pee (urinate).  Dry mouth.  Sore throat. How is this diagnosed?  Your medical history.  A physical exam.  A test that is done when you are sleeping (sleep study). The test is most often done in a sleep lab but may also be done at home. How is this treated?  Sleeping on your side.  Using a medicine to get rid of mucus in your nose (decongestant).  Avoiding the use of alcohol, medicines to help you relax, or certain pain medicines (narcotics).  Losing weight, if needed.  Changing your diet.  Not smoking.  Using a machine to open your airway while you sleep, such as: ? An oral appliance. This is a mouthpiece that shifts your lower jaw forward. ? A CPAP device. This device blows air through a mask  when you breathe out (exhale). ? An EPAP device. This has valves that you put in each nostril. ? A BPAP device. This device blows air through a mask when you breathe in (inhale) and breathe out.  Having surgery if other treatments do not work. It is important to get treatment for sleep apnea. Without treatment, it can lead to:  High blood pressure.  Coronary artery disease.  In men, not being able to have an erection  (impotence).  Reduced thinking ability.   Follow these instructions at home: Lifestyle  Make changes that your doctor recommends.  Eat a healthy diet.  Lose weight if needed.  Avoid alcohol, medicines to help you relax, and some pain medicines.  Do not use any products that contain nicotine or tobacco, such as cigarettes, e-cigarettes, and chewing tobacco. If you need help quitting, ask your doctor. General instructions  Take over-the-counter and prescription medicines only as told by your doctor.  If you were given a machine to use while you sleep, use it only as told by your doctor.  If you are having surgery, make sure to tell your doctor you have sleep apnea. You may need to bring your device with you.  Keep all follow-up visits as told by your doctor. This is important. Contact a doctor if:  The machine that you were given to use during sleep bothers you or does not seem to be working.  You do not get better.  You get worse. Get help right away if:  Your chest hurts.  You have trouble breathing in enough air.  You have an uncomfortable feeling in your back, arms, or stomach.  You have trouble talking.  One side of your body feels weak.  A part of your face is hanging down. These symptoms may be an emergency. Do not wait to see if the symptoms will go away. Get medical help right away. Call your local emergency services (911 in the [place] Do not drive yourself to the hospital. Summary  This condition affects breathing during sleep.  The most common cause is a collapsed or blocked airway.  The goal of treatment is to help you breathe normally while you sleep. This information is not intended to replace advice given to you by your health care provider. Make sure you discuss any questions you have with your health care provider. Document Revised: 05/04/2018 Document Reviewed: 03/13/2018 Elsevier Patient Education  [place].

## 2020-11-17 NOTE — Progress Notes (Signed)
PATIENT: Rachel Kim DOB: [birth year]  REASON FOR VISIT: follow up HISTORY FROM: patient  Chief Complaint  Patient presents with  . Obstructive Sleep Apnea    RM 1 alone Pt is well, CPAP helps sleep but still wakes up fatigued.      HISTORY OF PRESENT ILLNESS: 11/17/20 ALL:  She returns for follow up for OSA on BiPAP. She continues to do well on therapy. She is using BiPAP nightly. She denies any concerns with her machine. She does continue to note a leak. She is using F[employer]P Eson II size M. [employer] in [place], [place] [phone]. Otherwise, doing well.   She continues to have fatigue. She feels this is related to her weight. She was recently hospitalized for UTI and was educated on how to monitor carbs. She feels this is helping. She has lost 25 pounds since last being seen. She is working closely with psychiatry. She was switched to Prozac. Also on trazodone 150mg  daily. She has taken medication to help with increasing energy in the past that made her feel jittery. Wellbutrin was one she tried. Uncertain of others. BP has been well managed.      11/18/2019 ALL:  Rachel Kim is a 65 y.o. female here today for follow up for OSA on BiPAP therapy.  Vika reports that she is doing very well on BiPAP therapy.  She is received her new machine and all supplies needed.  She denies any concerns with BiPAP therapy.  She has noted more swelling of her hands over the past 2 months.  She was approved for disability in December and is now living in her own apartment.  She does admit to significant weight gain as she reports celebrating living on her own.  She does state that she has had more fast food recently.  Blood pressures have been mildly elevated.  She is followed by primary care for management.  Compliance report dated 10/19/2019 through 11/17/2019 reveals that she used BiPAP therapy 30 of the past 30 days for compliance of 100%.  She used BiPAP greater than 4 hours 30 of the past 30 days for  compliance 100%.  Average usage was 9 hours and 43 minutes.  Residual AHI was 4.2 with IPAP of 23 cm of water and EPAP of 18 cm of water, pressure support of 4 cm water.  There was no leak noted.  HISTORY: (copied from my note on 06/11/2019)  Rachel Kim is a 65 y.o. female here today for follow up for OSA on CPAP.  She reports that she is using CPAP every night, however, she has had difficulty with her machine "acting up".  She is currently homeless.  She reports that she is sleeping at a friend's house at the time being.  She has lost some of her supplies.  The machine works at times and other times it does not.  She is now insured with Medicaid and is interested in a new CPAP machine.  Her current machine is from 2011.  She did have a sleep study here with [place] last in 2011.  There is no compliance data to review at today's visit.  She is adamant that she uses CPAP nightly.  She endorses significant daytime sleepiness despite compliance with CPAP therapy.  She is always tired.  Some days she does not sleep at all and others she sleeps all day.   REVIEW OF SYSTEMS: Out of a complete 14 system review of symptoms, the patient complains  only of the following symptoms, fatigue, daytime sleepiness, and all other reviewed systems are negative.  ESS: 15 FSS: 63  ALLERGIES: Allergies  Allergen Reactions  . Doxazosin Swelling  . Labetalol Swelling    In doses >100 mg tablet has severe, painful swelling  . Actos [Pioglitazone Hydrochloride] Swelling  . Amlodipine Swelling  . Cozaar [Losartan Potassium] Other (See Comments)    Causes drowsiness/zombie like  . Glipizide Swelling  . Imdur [Isosorbide Dinitrate] Other (See Comments)    Causes severe headaches  . Clindamycin Hcl Rash    HOME MEDICATIONS: Outpatient Medications Prior to Visit  Medication Sig Dispense Refill  . Adalimumab 40 MG/0.4ML PNKT Inject 40 mg into the skin every 14 (fourteen) days.    Marland Kitchen aspirin 81 MG tablet Take 1 tablet  (81 mg total) by mouth daily. 30 tablet 0  . atorvastatin (LIPITOR) 20 MG tablet TAKE 1 TABLET BY MOUTH DAILY. 90 tablet 3  . benazepril (LOTENSIN) 40 MG tablet TAKE 1 TABLET BY MOUTH ONCE DAILY (Patient taking differently: 20 mg. TAKE 1 TABLET BY MOUTH ONCE DAILY)    . Cholecalciferol (VITAMIN D3) 75 MCG (3000 UT) TABS Take by mouth.    . Dulaglutide (TRULICITY) 1.5 TO/6.7TI SOPN Inject into the skin.    Marland Kitchen FLUoxetine (PROZAC) 20 MG capsule Take one capsule (20 mg dose) by mouth daily.    . hydrALAZINE (APRESOLINE) 100 MG tablet Take 1 tablet (100 mg total) 3 (three) times daily by mouth. 270 tablet 3  . indapamide (LOZOL) 1.25 MG tablet Take 1 tablet (1.25 mg total) by mouth daily. 90 tablet 1  . insulin lispro protamine-lispro (HUMALOG 75/25 MIX) (75-25) 100 UNIT/ML SUSP injection Inject into the skin.    Marland Kitchen labetalol (NORMODYNE) 100 MG tablet Take 1 tablet (100 mg total) by mouth 3 (three) times daily. 270 tablet 1  . levothyroxine (SYNTHROID, LEVOTHROID) 125 MCG tablet Take 1 tablet (125 mcg total) by mouth daily before breakfast. (Patient taking differently: Take 137 mcg by mouth daily before breakfast.) 90 tablet 1  . Melatonin 1 MG TABS Take 0.5-1 mg by mouth at bedtime.     . Multiple Vitamins-Minerals (PRESERVISION AREDS 2 PO) Take 2 capsules by mouth daily.     . pantoprazole (PROTONIX) 40 MG tablet Take by mouth.    Rachel Kim Glycol-Propyl Glycol (SYSTANE) 0.4-0.3 % SOLN Place 2 drops into both eyes 2 (two) times daily.    . polyethylene glycol (MIRALAX / GLYCOLAX) 17 g packet Take 17 g by mouth as needed.    . traZODone (DESYREL) 100 MG tablet Take 150 mg by mouth.    . sulfaSALAzine (AZULFIDINE) 500 MG tablet Take by mouth. 500 mg daily and 1000 mg at bedtime    . busPIRone (BUSPAR) 10 MG tablet Take 10 mg by mouth 3 (three) times daily.    . Cholecalciferol (VITAMIN D) 2000 UNITS CAPS Take by mouth daily.    . clobetasol (TEMOVATE) 0.05 % external solution Apply 1 application  topically as needed.     No facility-administered medications prior to visit.    PAST MEDICAL HISTORY: Past Medical History:  Diagnosis Date  . Acute on chronic respiratory failure with hypoxia ([place]) 11/27/2014  . Anxiety   . Chronic kidney disease (CKD), stage III (moderate) (HCC)   . CSA (central sleep apnea)   . Depression   . Diabetes mellitus   . GERD (gastroesophageal reflux disease)   . H/O breast biopsy 02/10/10   calcification , no tumors  .  High cholesterol   . Hyperlipidemia   . Hypertension   . Hyperthyroidism    diagnosed 1989  . LVH (left ventricular hypertrophy)    echo 02/25/10 EF >55%  . OSA (obstructive sleep apnea)    severe complex apnea  . Pneumonia 04/04  . RBBB   . Sarcoidosis of lung ([place])   . Sleep apnea    uses CPAP  . Thyroid disease    hypothyroidism    PAST SURGICAL HISTORY: Past Surgical History:  Procedure Laterality Date  . ABDOMINAL HYSTERECTOMY  12/07  . APPENDECTOMY  1970  . CARPAL TUNNEL RELEASE     right and left  . CHOLECYSTECTOMY  1998  . ELBOW FRACTURE SURGERY     with pins and pins removed  . laminectomy c5-c6  2001  . laminectomy l5, s1     [place]  . RETROPERITONEAL LYMPH NODE EXCISION     right underarm  . RHINOPLASTY  1985   has had several revisions  . rt knee arthroscopy  1997  . TONSILLECTOMY  1960  . tube placement L ear Left 07/29/2019  . tubinates trimmed  1986    FAMILY HISTORY: Family History  Problem Relation Age of Onset  . Colitis Father   . Thyroid disease Father   . Colon cancer Maternal Grandmother   . Hypertension Mother   . Stroke Mother   . Thyroid disease Mother   . Thyroid disease Sister   . Thyroid disease Brother   . Thyroid disease Sister   . Thyroid disease Brother     SOCIAL HISTORY: Social History   Socioeconomic History  . Marital status: Single    Spouse name: n/a  . Number of children: 0  . Years of education: Master's  . Highest education level: Not on file   Occupational History  . Occupation: Corporate investment banker  Tobacco Use  . Smoking status: Never Smoker  . Smokeless tobacco: Never Used  Vaping Use  . Vaping Use: Never used  Substance and Sexual Activity  . Alcohol use: No  . Drug use: No  . Sexual activity: Never    Partners: Male    Birth control/protection: Surgical    Comment: hysterectomy  Other Topics Concern  . Not on file  Social History Narrative   Lives alone with 2 kitties.   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  Generalized: Well developed, in no acute distress  Cardiology: normal rate and rhythm, no murmur noted Respiratory: clear to auscultation bilaterally  Neurological examination  Mentation: Alert oriented to time, place, history taking. Follows all commands speech and language fluent Cranial nerve II-XII: Pupils were equal round reactive to light. Extraocular movements were full, visual field were full on confrontational test. Facial sensation and strength were normal. Uvula tongue midline. Head turning and shoulder shrug  were normal and symmetric. Motor: The motor testing reveals  5 over 5 strength of all 4 extremities. Good symmetric motor tone is noted throughout.  Gait and station: Gait is normal.  No peripheral edema noted today on exam.  DIAGNOSTIC DATA (LABS, IMAGING, TESTING) - I reviewed patient records, labs, notes, testing and imaging myself where available.  No flowsheet data found.   Lab Results  Component Value Date    WBC 6.9 04/20/2017   HGB 12.5 04/20/2017   HCT 38.4 04/20/2017   MCV 87 04/20/2017   PLT 175 04/20/2017      Component Value Date/Time   NA 144 09/12/2017 1547   K 4.2 09/12/2017 1547   CL 102 09/12/2017 1547   CO2 23 09/12/2017 1547   GLUCOSE 127 (H) 09/12/2017 1547   GLUCOSE 90 04/18/2016 1729   BUN 27 09/12/2017 1547   CREATININE 1.74 (H) 09/12/2017 1547   CREATININE 1.53 (H) 04/18/2016 1729   CALCIUM 9.6 09/12/2017 1547   PROT 6.8 09/12/2017 1547   ALBUMIN 4.4 09/12/2017 1547   AST 17 09/12/2017 1547   ALT 14 09/12/2017 1547   ALKPHOS 93 09/12/2017 1547   BILITOT 0.3 09/12/2017 1547   GFRNONAA 31 (L) 09/12/2017 1547   GFRNONAA 37 (L) 04/18/2016 1729   GFRAA 36 (L) 09/12/2017 1547   GFRAA 42 (L) 04/18/2016 1729   Lab Results  Component Value Date   CHOL 154 09/12/2017   HDL 35 (L) 09/12/2017   LDLCALC 85 09/12/2017   TRIG 171 (H) 09/12/2017   CHOLHDL 4.4 09/12/2017   Lab Results  Component Value Date   HGBA1C 6.1 (H) 09/12/2017   Lab Results  Component Value Date   VITAMINB12 461 10/26/2013   Lab Results  Component Value Date   TSH 4.370 09/12/2017       ASSESSMENT AND PLAN 65 y.o. year old female  has a past medical history of Acute on chronic respiratory failure with hypoxia ([place]) (11/27/2014), Anxiety, Chronic kidney disease (CKD), stage III (moderate) (HCC), CSA (central sleep apnea), Depression, Diabetes mellitus, GERD (gastroesophageal reflux disease), H/O breast biopsy (02/10/10), High cholesterol, Hyperlipidemia, Hypertension, Hyperthyroidism, LVH (left ventricular hypertrophy), OSA (obstructive sleep apnea), Pneumonia (04/04), RBBB, Sarcoidosis of lung ([place]), Sleep apnea, and Thyroid disease. here with     ICD-10-CM   1. OSA treated with BiPAP  G47.33 For home use only DME Bipap    For home use only DME Bipap  2. Central sleep apnea with Cheyne-Stokes respiration  R06.3 For home use only DME Bipap    For home use only DME Bipap  3.  Hypersomnia with long sleep time, idiopathic  G47.11      Rachel Kim is doing very well on BiPAP therapy. She can not sleep without using BiPAP. Compliance is excellent. She does have a large leak. I will send orders for a mask refit. She really likes current mask. Will also update supply orders. We have discussed ESS and FSS scores. She continues to work on healthy lifestyle habits. She is following closely with psychiatry and PCP. She is hoping to decrease BP medications to see if this helps. We have discussed consideration of modafinil or armodafinil if BP remains well managed. She will return to see [place] in 1 year, sooner if needed.  She verbalizes understanding and agreement with this plan.   Orders Placed This Encounter  Procedures  . For home use only DME Bipap    Mask refitting    Order Specific Question:   Length of Need    Answer:   Lifetime  Order Specific Question:   Inspiratory pressure    Answer:   OTHER SEE COMMENTS    Order Specific Question:   Expiratory pressure    Answer:   OTHER SEE COMMENTS  . For home use only DME Bipap    Supplies    Order Specific Question:   Length of Need    Answer:   Lifetime    Order Specific Question:   Inspiratory pressure    Answer:   OTHER SEE COMMENTS    Order Specific Question:   Expiratory pressure    Answer:   OTHER SEE COMMENTS     No orders of the defined types were placed in this encounter.     I spent 20 minutes with the patient. 50% of this time was spent counseling and educating patient on plan of care and medications.    [name], FNP-C 11/17/2020, 1:55 PM [facility] [address], [place], [place] [address] [phone]

## 2020-11-19 NOTE — Progress Notes (Signed)
Cm sent to [facility]

## 2020-12-22 ENCOUNTER — Ambulatory Visit (INDEPENDENT_AMBULATORY_CARE_PROVIDER_SITE_OTHER): Payer: Medicaid Other | Admitting: Obstetrics & Gynecology

## 2020-12-22 ENCOUNTER — Encounter (HOSPITAL_BASED_OUTPATIENT_CLINIC_OR_DEPARTMENT_OTHER): Payer: Self-pay | Admitting: Obstetrics & Gynecology

## 2020-12-22 ENCOUNTER — Other Ambulatory Visit: Payer: Self-pay

## 2020-12-22 VITALS — BP 152/77 | HR 68 | Ht 63.5 in | Wt 234.0 lb

## 2020-12-22 DIAGNOSIS — R5382 Chronic fatigue, unspecified: Secondary | ICD-10-CM

## 2020-12-22 DIAGNOSIS — Z6841 Body Mass Index (BMI) 40.0 and over, adult: Secondary | ICD-10-CM

## 2020-12-22 DIAGNOSIS — G4733 Obstructive sleep apnea (adult) (pediatric): Secondary | ICD-10-CM

## 2020-12-22 DIAGNOSIS — N183 Chronic kidney disease, stage 3 unspecified: Secondary | ICD-10-CM

## 2020-12-22 DIAGNOSIS — E039 Hypothyroidism, unspecified: Secondary | ICD-10-CM

## 2020-12-22 DIAGNOSIS — E1169 Type 2 diabetes mellitus with other specified complication: Secondary | ICD-10-CM | POA: Diagnosis not present

## 2020-12-22 NOTE — Progress Notes (Signed)
GYNECOLOGY  VISIT  CC:   Discuss fatigue  HPI: 65 y.o. G0P0000 Single White or Caucasian female here to discuss fatigue.  Pt has TAH/BSO in 2007.  Did well after surgery.  Pt continued to follow up with PCP.  Denies any vaginal bleeding since surgery.  Denies vaginal discharge.  Really not gyn complaints.  Pathology reviewed with pt.  No need for routine screening pap smears.  Pt is having breast cancer screening with mammography.  Most recent was Reports slowly over last 15 years, she has developed fatigue and wants to know if related to her hysterectomy.  I do not think so.  Reviewed chart including colonoscopy in 2020, lab work done earlier this year, recent mammogram, hepatitis testing, PCP notes as well.  Pt has hx of diabetes and is on insulin.  H/O elevated hbA1C last fall when medications were stopped as she was under such good control.  hba1C got above 13.  Now back to 6.6.  Has additional follow up scheduled.  Pt's recent hb was 11.0.  Suggest she have iron studies done as well.  If iron deficient, may need to consider repeat colonoscopy to assess for source of blood loss.  Also, recent TSH is elevated.  Free t4 is in normal range but trending down over the past few years.  May need adjustment in thyroid medication.  May benefit from endocrinologist.  Pt does have sleep apnea as well.  Uses CPAP faithfully.  Feels this does really help.  Has cardiologist.  Having increased palpitations this year.  Has appt in June/July time frame.  No chest pain.  Recommended she share with cardiologist fatigue concerns as well as palpitations.  Reviewed medications as well and discussed medications that have fatigue as a side effect.  Overall, feel there are several possible reasons for fatigue that are most likely not gyn related.  GYNECOLOGIC HISTORY: Patient's last menstrual period was 07/01/2006. Contraception: PMP Menopausal hormone therapy: none  MMG:  04/2020.  Done at [facility]. Colonoscopy:  01/11/2019.   Dr. Cari Sella.  Follow up 10 years.  Patient Active Problem List   Diagnosis Date Noted  . Chronic kidney disease (CKD), stage III (moderate) (HCC)   . Diabetes mellitus ([place])   . OSA (obstructive sleep apnea)   . Fatigue 10/22/2020  . Central sleep apnea with Cheyne-Stokes respiration 07/18/2019  . Hypoxia, sleep related 07/18/2019  . Dysthymia 01/16/2018  . Hypersomnia with long sleep time, idiopathic 01/16/2018  . Elevated cholesterol 09/12/2017  . Psoriasis 05/30/2017  . Nocturia 01/28/2015  . CKD (chronic kidney disease) stage 3, GFR 30-59 ml/min (HCC) 11/27/2014  . CKD stage 4 due to type 2 diabetes mellitus ([place]) 11/27/2014  . Dermatitis seborrheica 12/26/2012  . OSA on CPAP 12/19/2012  . Acne 07/04/2011  . Personal history of diseases of skin or subcutaneous tissue 07/04/2011  . Neurodermatitis 07/04/2011  . Hyperpigmentation of skin, postinflammatory 07/04/2011  . 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 Hypothyroidism 09/28/2006  . Controlled diabetes mellitus type 2 with complications ([place]) 86/76/1950  . Mixed hyperlipidemia 09/28/2006  . Morbid obesity ([place]) 09/28/2006  . HYPERTENSION, BENIGN SYSTEMIC 09/28/2006    Past Medical History:  Diagnosis Date  . Acute on chronic respiratory failure with hypoxia ([place]) 11/27/2014  . Anxiety   . Chronic kidney disease (CKD), stage III (moderate) (HCC)   . CSA (central sleep apnea)   . Depression   . Diabetes mellitus   . GERD (gastroesophageal reflux disease)   . H/O breast biopsy 02/10/10  calcification , no tumors  . High cholesterol   . Hyperlipidemia   . Hypertension   . Hyperthyroidism    diagnosed 1989  . LVH (left ventricular hypertrophy)    echo 02/25/10 EF >55%  . OSA (obstructive sleep apnea)    severe complex apnea  . Pneumonia 04/04  . RBBB   . Sarcoidosis of lung ([place])   . Sleep apnea    uses CPAP  . Thyroid disease    hypothyroidism    Past Surgical History:  Procedure Laterality Date  . ABDOMINAL  HYSTERECTOMY  12/07  . APPENDECTOMY  1970  . CARPAL TUNNEL RELEASE     right and left  . CHOLECYSTECTOMY  1998  . DG GALL BLADDER  1998  . ELBOW FRACTURE SURGERY     with pins and pins removed  . laminectomy c5-c6  2001  . laminectomy l5, s1     [place]  . RETROPERITONEAL LYMPH NODE EXCISION     right underarm  . RHINOPLASTY  1985   has had several revisions  . rt knee arthroscopy  1997  . TONSILLECTOMY  1960  . tube placement L ear Left 07/29/2019  . tubinates trimmed  1986    MEDS:   Current Outpatient Medications on File Prior to Visit  Medication Sig Dispense Refill  . Adalimumab 40 MG/0.4ML PNKT Inject 40 mg into the skin every 14 (fourteen) days.    Marland Kitchen aspirin 81 MG tablet Take 1 tablet (81 mg total) by mouth daily. 30 tablet 0  . atorvastatin (LIPITOR) 20 MG tablet TAKE 1 TABLET BY MOUTH DAILY. 90 tablet 3  . Cholecalciferol (VITAMIN D3) 75 MCG (3000 UT) TABS Take by mouth.    . Dulaglutide (TRULICITY) 1.5 IR/4.4RX SOPN Inject into the skin.    Marland Kitchen FLUoxetine (PROZAC) 20 MG capsule Take one capsule (20 mg dose) by mouth daily.    . hydrALAZINE (APRESOLINE) 100 MG tablet Take 1 tablet (100 mg total) 3 (three) times daily by mouth. 270 tablet 3  . indapamide (LOZOL) 1.25 MG tablet Take 1 tablet (1.25 mg total) by mouth daily. 90 tablet 1  . insulin lispro protamine-lispro (HUMALOG 75/25 MIX) (75-25) 100 UNIT/ML SUSP injection Inject into the skin.    Marland Kitchen labetalol (NORMODYNE) 100 MG tablet Take 1 tablet (100 mg total) by mouth 3 (three) times daily. 270 tablet 1  . levothyroxine (SYNTHROID, LEVOTHROID) 125 MCG tablet Take 1 tablet (125 mcg total) by mouth daily before breakfast. (Patient taking differently: Take 137 mcg by mouth daily before breakfast.) 90 tablet 1  . Melatonin 1 MG TABS Take 0.5-1 mg by mouth at bedtime.     . metoCLOPramide (REGLAN) 10 MG tablet Take one tablet (10 mg dose) by mouth 3 (three) times daily with meals.    . Multiple Vitamins-Minerals  (PRESERVISION AREDS 2 PO) Take 2 capsules by mouth daily.     . pantoprazole (PROTONIX) 40 MG tablet Take by mouth.    Vladimir Faster Glycol-Propyl Glycol (SYSTANE) 0.4-0.3 % SOLN Place 2 drops into both eyes 2 (two) times daily.    . polyethylene glycol (MIRALAX / GLYCOLAX) 17 g packet Take 17 g by mouth as needed.    . traZODone (DESYREL) 100 MG tablet Take 150 mg by mouth.    . traZODone (DESYREL) 100 MG tablet Take by mouth.    . sulfaSALAzine (AZULFIDINE) 500 MG tablet Take by mouth. 500 mg daily and 1000 mg at bedtime     No current  facility-administered medications on file prior to visit.    ALLERGIES: Amlodipine, Doxazosin, Glipizide, Labetalol, Pioglitazone, Actos [pioglitazone hydrochloride], Cozaar [losartan potassium], Imdur [isosorbide dinitrate], and Clindamycin hcl  Family History  Problem Relation Age of Onset  . Colitis Father   . Thyroid disease Father   . Colon cancer Maternal Grandmother   . Hypertension Mother   . Stroke Mother   . Thyroid disease Mother   . Thyroid disease Sister   . Thyroid disease Brother   . Thyroid disease Sister   . Thyroid disease Brother     SH:  Single, non smoker  Review of Systems  Constitutional: Positive for fatigue.  Respiratory: Negative for shortness of breath.   Cardiovascular: Positive for palpitations.  Gastrointestinal: Negative.   Genitourinary: Negative.   Psychiatric/Behavioral: Negative.     PHYSICAL EXAMINATION:    BP (!) 152/77   Pulse 68   Ht 5' 3.5" (1.613 m)   Wt 234 lb (106.1 kg)   LMP 07/01/2006   BMI 40.80 kg/m     Physical Exam Vitals reviewed.  Constitutional:      Appearance: Normal appearance.  Pulmonary:     Effort: Pulmonary effort is normal.  Skin:    General: Skin is warm.  Neurological:     General: No focal deficit present.     Mental Status: She is alert.  Psychiatric:        Mood and Affect: Mood normal.    Assessment/Plan: 1. Chronic fatigue - suggest follow up again with  PCP - may benefit from endocrinology referral - recommend discussing concerns with cardiologist with upcoming appt  2. Stage 3 chronic kidney disease, unspecified whether stage 3a or 3b CKD ([place])  3. Type 2 diabetes mellitus with other specified complication, without long-term current use of insulin ([place])  4. OSA (obstructive sleep apnea) - on CPAP  5. BMI 40.0-44.9, adult ([place])  6. Hypothyroidism, unspecified type  Total time with pt including documentation:  37 minutes

## 2020-12-23 ENCOUNTER — Encounter (HOSPITAL_BASED_OUTPATIENT_CLINIC_OR_DEPARTMENT_OTHER): Payer: Self-pay | Admitting: Obstetrics & Gynecology

## 2020-12-23 DIAGNOSIS — N183 Chronic kidney disease, stage 3 unspecified: Secondary | ICD-10-CM | POA: Insufficient documentation

## 2020-12-23 DIAGNOSIS — E119 Type 2 diabetes mellitus without complications: Secondary | ICD-10-CM | POA: Insufficient documentation

## 2020-12-23 DIAGNOSIS — G4733 Obstructive sleep apnea (adult) (pediatric): Secondary | ICD-10-CM | POA: Insufficient documentation

## 2020-12-24 ENCOUNTER — Telehealth (HOSPITAL_BASED_OUTPATIENT_CLINIC_OR_DEPARTMENT_OTHER): Payer: Self-pay | Admitting: *Deleted

## 2020-12-24 ENCOUNTER — Other Ambulatory Visit (HOSPITAL_BASED_OUTPATIENT_CLINIC_OR_DEPARTMENT_OTHER): Payer: Self-pay | Admitting: Obstetrics & Gynecology

## 2020-12-24 MED ORDER — NONFORMULARY OR COMPOUNDED ITEM: 3 refills | Status: DC

## 2020-12-24 NOTE — Telephone Encounter (Signed)
LMOVM that Rx for Vit E had been faxed to [facility].

## 2021-01-21 ENCOUNTER — Encounter (HOSPITAL_BASED_OUTPATIENT_CLINIC_OR_DEPARTMENT_OTHER): Payer: Self-pay | Admitting: Obstetrics & Gynecology

## 2021-02-19 ENCOUNTER — Ambulatory Visit (INDEPENDENT_AMBULATORY_CARE_PROVIDER_SITE_OTHER): Payer: 59 | Admitting: Cardiovascular Disease

## 2021-02-19 ENCOUNTER — Other Ambulatory Visit: Payer: Self-pay

## 2021-02-19 ENCOUNTER — Encounter: Payer: Self-pay | Admitting: Cardiovascular Disease

## 2021-02-19 VITALS — BP 137/82 | HR 64 | Ht 64.5 in | Wt 222.6 lb

## 2021-02-19 DIAGNOSIS — N1832 Chronic kidney disease, stage 3b: Secondary | ICD-10-CM

## 2021-02-19 DIAGNOSIS — K559 Vascular disorder of intestine, unspecified: Secondary | ICD-10-CM

## 2021-02-19 DIAGNOSIS — I10 Essential (primary) hypertension: Secondary | ICD-10-CM

## 2021-02-19 DIAGNOSIS — E118 Type 2 diabetes mellitus with unspecified complications: Secondary | ICD-10-CM | POA: Diagnosis not present

## 2021-02-19 DIAGNOSIS — E782 Mixed hyperlipidemia: Secondary | ICD-10-CM

## 2021-02-19 DIAGNOSIS — Z9989 Dependence on other enabling machines and devices: Secondary | ICD-10-CM

## 2021-02-19 DIAGNOSIS — G4733 Obstructive sleep apnea (adult) (pediatric): Secondary | ICD-10-CM

## 2021-02-19 DIAGNOSIS — E039 Hypothyroidism, unspecified: Secondary | ICD-10-CM

## 2021-02-19 NOTE — Progress Notes (Signed)
Cardiology Office Note    Date:  02/20/2021   ID:  Rachel Kim, DOB 1955/09/13, MRN [id number]  PCP:  Judi Saa, MD  Cardiologist:   Sanda Klein, MD   Chief Complaint  Patient presents with   dyslipidemia    History of Present Illness:  Rachel Kim is a 65 y.o. female with morbid obesity associated with multiple complications including hypertension, diabetes mellitus requiring insulin, hyperlipidemia and complex central and obstructive sleep apnea.  All told she is doing quite well.  She denies any problems with angina or dyspnea at rest or with activity and has become more physically active.  She continues to lose weight and her BMI is now down to 37.  She complains of feeling "too tired" all the time.  She is 100% compliant with her BiPAP.  Just a few weeks ago, her dose of levothyroxine was increased but the follow-up TSH performed last week was in normal range.  She was also found to have mild anemia and a low B12 level and has started treatment with vitamin B12 shots.  She has not had palpitations, dizziness, syncope, falls, lower extremity edema or focal neurological complaints or intermittent claudication.  She has a lot of complaints related to diabetic gastroparesis.  She has lost another 20 pounds in the last 3 months.  Glycemic control is exceptional and her hemoglobin A1c is down to 4.5%.  However attempts to stop her Trulicity have led to immediate spikes in her glucose levels.  She only uses insulin as needed for spikes in blood glucose and has not required it in the last month.  Lipid profile performed today shows further improvement in her triglyceride levels and HDL levels.  The LDL remains a little high at 84, but this is also steadily improving.  Blood pressure control appears to be fair.  She has difficulty monitoring this, since she cannot find the cuff that is large enough for her arm.  Clinically she feels well.  She denies change in her exertional dyspnea  which remains functional class II, occasionally class III.  Sometimes she has shortness of breath when making the bed.  She denies lower extremity edema.  She does not have palpitations, dizziness or syncope.  She denies orthopnea or PND and chest pain.  She has not had any focal neurological complaints or intermittent claudication.  In August 2020 she had an episode of ischemic colitis and it was recommended for her to have coronary evaluation: She had a normal nuclear stress test in December 2020.  She has chronic kidney disease and her nephrologist is Dr. Edrick Oh.  On July 04 , 2022 her creatinine was 1.67 (estimated GFR 35) and this is unchanged from last December.  She has extensive problems with psoriasis and psoriatic arthritis.  She is receiving Humira.  She reports 100% compliance with CPAP and she denies daytime hypersomnolence.   Past Medical History:  Diagnosis Date   Acute on chronic respiratory failure with hypoxia ([place]) 11/27/2014   Anxiety    Chronic kidney disease (CKD), stage III (moderate) (HCC)    CSA (central sleep apnea)    Depression    Diabetes mellitus    GERD (gastroesophageal reflux disease)    H/O breast biopsy 02/10/10   calcification , no tumors   High cholesterol    Hyperlipidemia    Hypertension    Hyperthyroidism    diagnosed 1989   LVH (left ventricular hypertrophy)    echo 02/25/10 EF >55%  OSA (obstructive sleep apnea)    severe complex apnea   Pneumonia 04/04   RBBB    Sarcoidosis of lung ([place])    Sleep apnea    uses CPAP   Thyroid disease    hypothyroidism    Past Surgical History:  Procedure Laterality Date   ABDOMINAL HYSTERECTOMY  12/07   APPENDECTOMY  1970   CARPAL TUNNEL RELEASE     right and left   CHOLECYSTECTOMY  1998   DG GALL BLADDER  1998   ELBOW FRACTURE SURGERY     with pins and pins removed   laminectomy c5-c6  2001   laminectomy l5, s1     [place]     right underarm    RHINOPLASTY  1985   has had several revisions   rt knee arthroscopy  [place]   tube placement L ear Left 07/29/2019   tubinates trimmed  1986    Current Medications: Outpatient Medications Prior to Visit  Medication Sig Dispense Refill   ACCU-CHEK GUIDE test strip in the morning, at noon, in the evening, and at bedtime.     Accu-Chek Softclix Lancets lancets in the morning, at noon, in the evening, and at bedtime.     Adalimumab 40 MG/0.4ML PNKT Inject 40 mg into the skin every 14 (fourteen) days.     aspirin 81 MG tablet Take 1 tablet (81 mg total) by mouth daily. 30 tablet 0   atorvastatin (LIPITOR) 20 MG tablet TAKE 1 TABLET BY MOUTH DAILY. 90 tablet 3   benazepril (LOTENSIN) 20 MG tablet Take 20 mg by mouth daily.     Cholecalciferol (VITAMIN D3) 75 MCG (3000 UT) TABS Take 3,000 Units by mouth daily.     clobetasol (TEMOVATE) 0.05 % external solution Apply topically once a week.     cyanocobalamin (,VITAMIN B-12,) 1000 MCG/ML injection once a week.     Dulaglutide (TRULICITY) 1.5 0000000 SOPN Inject into the skin once a week. Patient injects on Tuesday     FLUoxetine (PROZAC) 20 MG capsule Take one capsule (20 mg dose) by mouth daily.     hydrALAZINE (APRESOLINE) 100 MG tablet Take 1 tablet (100 mg total) 3 (three) times daily by mouth. 270 tablet 3   indapamide (LOZOL) 1.25 MG tablet Take 1 tablet (1.25 mg total) by mouth daily. 90 tablet 1   insulin lispro protamine-lispro (HUMALOG 75/25 MIX) (75-25) 100 UNIT/ML SUSP injection Inject into the skin.     labetalol (NORMODYNE) 100 MG tablet Take 1 tablet (100 mg total) by mouth 3 (three) times daily. 270 tablet 1   levothyroxine (SYNTHROID, LEVOTHROID) 125 MCG tablet Take 1 tablet (125 mcg total) by mouth daily before breakfast. (Patient taking differently: Take 137 mcg by mouth daily before breakfast.) 90 tablet 1   Melatonin 1 MG CHEW Chew 1 mg by mouth at bedtime.     Melatonin 1 MG TABS Take 0.5-1 mg by mouth at  bedtime.      metoCLOPramide (REGLAN) 10 MG tablet Take one tablet (10 mg dose) by mouth 3 (three) times daily with meals.     Multiple Vitamins-Minerals (PRESERVISION AREDS 2 PO) Take 2 capsules by mouth daily.      pantoprazole (PROTONIX) 40 MG tablet Take 40 mg by mouth daily.     Polyethyl Glycol-Propyl Glycol (SYSTANE) 0.4-0.3 % SOLN Place 2 drops into both eyes 2 (two) times daily.     polyethylene glycol (  MIRALAX / GLYCOLAX) 17 g packet Take 17 g by mouth as needed.     sulfaSALAzine (AZULFIDINE) 500 MG tablet Take by mouth. 500 mg daily and 1000 mg at bedtime     traZODone (DESYREL) 100 MG tablet Take by mouth.     traZODone (DESYREL) 100 MG tablet Take by mouth.     NONFORMULARY OR COMPOUNDED ITEM Vitamin E vaginal suppositories 200u/ml.  One pv three times weekly. (Patient not taking: Reported on 02/19/2021) 36 each 3   traZODone (DESYREL) 100 MG tablet Take 150 mg by mouth.     No facility-administered medications prior to visit.     Allergies:   Amlodipine, Doxazosin, Glipizide, Labetalol, Pioglitazone, Actos [pioglitazone hydrochloride], Cozaar [losartan potassium], Imdur [isosorbide dinitrate], and Clindamycin hcl   Social History   Socioeconomic History   Marital status: Single    Spouse name: n/a   Number of children: 0   Years of education: Master's   Highest education level: Not on file  Occupational History   Occupation: Corporate investment banker  Tobacco Use   Smoking status: Never   Smokeless tobacco: Never  Vaping Use   Vaping Use: Never used  Substance and Sexual Activity   Alcohol use: No   Drug use: No   Sexual activity: Not Currently    Partners: Male    Birth control/protection: Surgical    Comment: hysterectomy  Other Topics Concern   Not on file  Social History Narrative   Lives alone with 2 kitties.   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   ROS:   Please see the history of present illness.    ROS All other systems are reviewed and are negative.  PHYSICAL EXAM:   VS:  BP 137/82 (BP Location: Left Arm, Patient Position: Sitting, Cuff Size: Large)   Pulse 64   Ht 5' 4.5" (1.638 m)   Wt 222 lb 9.6 oz (101 kg)   LMP 07/01/2006   SpO2 95%   BMI 37.62 kg/m    General: Alert, oriented x3, no distress, severely obese Head: no evidence of trauma, PERRL, EOMI, no exophtalmos or lid lag, no myxedema, no xanthelasma; normal ears, nose and oropharynx Neck: normal jugular venous pulsations and no hepatojugular reflux; brisk carotid pulses without delay and no carotid bruits Chest: clear to auscultation, no signs of consolidation by percussion or palpation, normal fremitus, symmetrical and full respiratory excursions Cardiovascular: normal position and quality of the apical impulse, regular rhythm, normal first and widely split second heart sounds, no murmurs, rubs or gallops Abdomen: no tenderness or distention, no masses by palpation, no abnormal pulsatility or arterial bruits, normal bowel sounds, no hepatosplenomegaly Extremities: no clubbing, cyanosis or edema; 2+ radial, ulnar and brachial pulses bilaterally; 2+ right femoral, posterior tibial and dorsalis pedis pulses; 2+ left femoral, posterior tibial and dorsalis pedis pulses; no subclavian or femoral bruits Neurological: grossly nonfocal Psych: Normal mood and affect  Wt Readings from Last 3 Encounters:  02/19/21 222 lb 9.6 oz (101 kg)  12/22/20 234 lb (106.1  kg)  11/17/20 241 lb (109.3 kg)      Studies/Labs Reviewed:   ECHO (05/23/2018 [facility]) The left ventricle is normal in size. There is moderate concentric left ventricular hypertrophy. The left ventricular ejection fraction is normal (55-60%). The left ventricular wall motion is normal. Grade I mild diastolic dysfunction; abnormal relaxation pattern. The left atrium is mildly dilated. The tricuspid valve leaflets are structurally normal with trace regurgitation. Estimation of right ventricular systolic pressure is not possible. The aortic valve is not well visualized, but is grossly normal. The aortic valve is trileaflet.  EKG:  EKG is ordered today.  Personally reviewed it appears identical to her previous tracing and shows sinus rhythm with bifascicular block (RBBB plus LAFB.  The QTC is normal at 482 ms.  No ischemic repolarization changes are seen.    Recent Labs: No results found for requested labs within last 8760 hours.  Care everywhere  03/08/2019 Hemoglobin A1c 4.8% 03/29/2019 Glucose 140, BUN 30, creatinine 1.79, potassium 4.1, normal LFTs 04/04/2019 Creatinine 1.91t, normal LFTs 06/15/2020 hemoglobin A1c 13.8% 08/12/2020 Creatinine 1.65 (GFR 35) 02/01/2021 Hemoglobin 10.3, vitamin B12 211 TSH 1.39, Hemoglobin A1c 4.5% Creatinine 1.67   Lipid Panel    Component Value Date/Time   CHOL 149 02/19/2021 1512   TRIG 154 (H) 02/19/2021 1512   HDL 38 (L) 02/19/2021 1512   CHOLHDL 3.9 02/19/2021 1512   CHOLHDL 5.1 (H) 04/18/2016 1729   VLDL 34 (H) 04/18/2016 1729   LDLCALC 84 02/19/2021 1512  09/14/2018 Total cholesterol 160, triglycerides 187, HDL 40, LDL 83 05/23/2018 Total cholesterol 110, triglycerides 128 HDL 36, LDL 48 11/05/2019 Total cholesterol 159, HDL 37, LDL 60, triglycerides 398  ASSESSMENT:    1. Essential hypertension   2. Mixed hyperlipidemia   3. Controlled type 2 diabetes mellitus with complication, without long-term current use of  insulin (HCC)   4. Stage 3b chronic kidney disease ([place])   5. Severe obesity (BMI 35.0-39.9) with comorbidity ([place])   6. OSA on CPAP   7. Ischemic colitis ([place])      PLAN:  In order of problems listed above:  HTN: So far, we have not been able to find her a large cuff (she needs at least a size 12).  Blood pressure control was close to target range although not perfect.  Since her weight loss is continuing the case, I decided not to adjust her medicines any further. HLP: Ideally would get her LDL less than 70, since she appears to have evidence of PAD.  But for the same reason as above decided not to make a change today.  Her commitment to weight loss is exemplary. DM: She has really done very well on GLP-1 agonist therapy with substantial weight loss and amazing improvement in glucose control. CKD: Stable, GFR around 35. Morbid obesity: She has previously expressed interest in bariatric surgery, but to date has been unable to do so due to insurance. OSA: Reports compliance with BiPAP.  Complains of feeling tired, but confounding issues or other recent anemia due to B12 deficiency and the need for adjustment in thyroid hormone therapy.  Would wait several more weeks to see the full impact of these treatments before exploring whether she needs adjustment in CPAP settings. Ischemic colitis: No recent symptoms.  The presence of PAD increases the likelihood that she has coronary problems, but she does not have angina and  there was no evidence of coronary insufficiency on her nuclear stress test about a year ago.  The focus remains on risk factor modification.   Medication Adjustments/Labs and Tests Ordered: Current medicines are reviewed at length with the patient today.  Concerns regarding medicines are outlined above.  Medication changes, Labs and Tests ordered today are listed in the Patient Instructions below. Patient Instructions  Medication Instructions:  No changes *If you need a refill  on your cardiac medications before your next appointment, please call your pharmacy*   Lab Work: Your provider would like for you to have the following labs today: lipid  If you have labs (blood work) drawn today and your tests are completely normal, you will receive your results only by: [place] (if you have MyChart) OR A paper copy in the mail If you have any lab test that is abnormal or we need to change your treatment, we will call you to review the results.   Testing/Procedures: None ordered   Follow-Up: At [facility], you and your health needs are our priority.  As part of our continuing mission to provide you with exceptional heart care, we have created designated Provider Care Teams.  These Care Teams include your primary Cardiologist (physician) and Advanced Practice Providers (APPs -  Physician Assistants and Nurse Practitioners) who all work together to provide you with the care you need, when you need it.  We recommend signing up for the patient portal called "MyChart".  Sign up information is provided on this After Visit Summary.  MyChart is used to connect with patients for Virtual Visits (Telemedicine).  Patients are able to view lab/test results, encounter notes, upcoming appointments, etc.  Non-urgent messages can be sent to your provider as well.   To learn more about what you can do with MyChart, go to [email or web address].    Your next appointment:   12 month(s)  The format for your next appointment:   In Person  Provider:   You may see Sanda Klein, MD or one of the following Advanced Practice Providers on your designated Care Team:   Almyra Deforest, PA-C Fabian Sharp, [place] or  Roby Lofts, PA-C    Signed, Sanda Klein, MD  02/20/2021 11:25 AM    [address], [place], [place]  [address] Phone: [phone]; Fax: [phone]

## 2021-02-19 NOTE — Patient Instructions (Signed)
Medication Instructions:  No changes *If you need a refill on your cardiac medications before your next appointment, please call your pharmacy*   Lab Work: Your provider would like for you to have the following labs today: lipid  If you have labs (blood work) drawn today and your tests are completely normal, you will receive your results only by: [place] (if you have MyChart) OR A paper copy in the mail If you have any lab test that is abnormal or we need to change your treatment, we will call you to review the results.   Testing/Procedures: None ordered   Follow-Up: At [facility], you and your health needs are our priority.  As part of our continuing mission to provide you with exceptional heart care, we have created designated Provider Care Teams.  These Care Teams include your primary Cardiologist (physician) and Advanced Practice Providers (APPs -  Physician Assistants and Nurse Practitioners) who all work together to provide you with the care you need, when you need it.  We recommend signing up for the patient portal called "MyChart".  Sign up information is provided on this After Visit Summary.  MyChart is used to connect with patients for Virtual Visits (Telemedicine).  Patients are able to view lab/test results, encounter notes, upcoming appointments, etc.  Non-urgent messages can be sent to your provider as well.   To learn more about what you can do with MyChart, go to [email or web address].    Your next appointment:   12 month(s)  The format for your next appointment:   In Person  Provider:   You may see Sanda Klein, MD or one of the following Advanced Practice Providers on your designated Care Team:   Almyra Deforest, PA-C Fabian Sharp, PA-C or  Roby Lofts, [place]

## 2021-02-20 ENCOUNTER — Encounter: Payer: Self-pay | Admitting: Cardiovascular Disease

## 2021-02-20 LAB — LIPID PANEL
Chol/HDL Ratio: 3.9 ratio (ref 0.0–4.4)
Cholesterol, Total: 149 mg/dL (ref 100–199)
HDL: 38 mg/dL — ABNORMAL LOW (ref >39)
LDL Chol Calc (NIH): 84 mg/dL (ref 0–99)
Triglycerides: 154 mg/dL — ABNORMAL HIGH (ref 0–149)
VLDL Cholesterol Cal: 27 mg/dL (ref 5–40)

## 2021-02-20 MED ORDER — LEVOTHYROXINE SODIUM 137 MCG PO TABS: 137.0000 ug | ORAL_TABLET | Freq: Every day | ORAL | Status: DC

## 2021-05-18 ENCOUNTER — Other Ambulatory Visit: Payer: Self-pay | Admitting: Cardiovascular Disease

## 2021-05-18 DIAGNOSIS — I10 Essential (primary) hypertension: Secondary | ICD-10-CM

## 2021-06-22 ENCOUNTER — Encounter: Payer: Self-pay | Admitting: Cardiovascular Disease

## 2021-06-22 DIAGNOSIS — I10 Essential (primary) hypertension: Secondary | ICD-10-CM

## 2021-07-08 MED ORDER — BENAZEPRIL HCL 40 MG PO TABS: 40.0000 mg | ORAL_TABLET | Freq: Every day | ORAL | Status: DC

## 2021-07-08 MED ORDER — INDAPAMIDE 1.25 MG PO TABS: 1.2500 mg | ORAL_TABLET | ORAL | 1 refills | Status: DC

## 2021-07-20 MED ORDER — BENAZEPRIL HCL 40 MG PO TABS: 40.0000 mg | ORAL_TABLET | Freq: Every day | ORAL | 1 refills | Status: DC

## 2021-07-20 NOTE — Addendum Note (Signed)
Addended by: Ricci Barker on: 07/20/2021 08:11 AM   Modules accepted: Orders

## 2021-08-26 ENCOUNTER — Other Ambulatory Visit: Payer: Self-pay | Admitting: Cardiovascular Disease

## 2021-10-22 ENCOUNTER — Encounter: Payer: Self-pay | Admitting: Cardiovascular Disease

## 2021-10-22 NOTE — Telephone Encounter (Signed)
-  Returned call to pt ?-She report in the last year, she's experienced 3 episodes of chest tightness and pressure. ?-She denies any other symptoms such as lightheadedness, dizziness, or SOB but report the last episode on 3/19 she was sweating. ?-Pt unable to recognize any alleviating or precipitating factors and state symptoms usually only last a few minutes.  ?-Pt denies symptoms currently ? ?Appointment scheduled for 3/28 with Almyra Deforest, PA for further evaluations. Pt also made aware of ED precaution should any new symptoms develop or worsen and verbalizes understanding.  ? ? ? ?

## 2021-10-26 ENCOUNTER — Encounter: Payer: Self-pay | Admitting: Physician Assistant

## 2021-10-26 ENCOUNTER — Ambulatory Visit (INDEPENDENT_AMBULATORY_CARE_PROVIDER_SITE_OTHER): Payer: 59 | Admitting: Physician Assistant

## 2021-10-26 ENCOUNTER — Other Ambulatory Visit: Payer: Self-pay

## 2021-10-26 VITALS — BP 144/82 | HR 60 | Ht 64.5 in | Wt 208.0 lb

## 2021-10-26 DIAGNOSIS — Z79899 Other long term (current) drug therapy: Secondary | ICD-10-CM

## 2021-10-26 DIAGNOSIS — R079 Chest pain, unspecified: Secondary | ICD-10-CM | POA: Diagnosis not present

## 2021-10-26 DIAGNOSIS — E785 Hyperlipidemia, unspecified: Secondary | ICD-10-CM | POA: Diagnosis not present

## 2021-10-26 DIAGNOSIS — I10 Essential (primary) hypertension: Secondary | ICD-10-CM

## 2021-10-26 DIAGNOSIS — E119 Type 2 diabetes mellitus without complications: Secondary | ICD-10-CM

## 2021-10-26 MED ORDER — INDAPAMIDE 1.25 MG PO TABS: 1.2500 mg | ORAL_TABLET | Freq: Every day | ORAL | 1 refills | Status: DC

## 2021-10-26 NOTE — Patient Instructions (Signed)
Medication Instructions:  ?TAKE Indapamie (Lozol) 1.25 mg daily ?*If you need a refill on your cardiac medications before your next appointment, please call your pharmacy* ? ?Lab Work: ?Your physician recommends that you return for lab work in 2 weeks:  ?BMET ? ?If you have labs (blood work) drawn today and your tests are completely normal, you will receive your results only by: ?MyChart Message (if you have MyChart) OR ?A paper copy in the mail ?If you have any lab test that is abnormal or we need to change your treatment, we will call you to review the results. ? ?Testing/Procedures: ?Your physician has requested that you have a lexiscan myoview. For further information please visit [email or web address]. Please follow instruction sheet, as given. ? ?Please schedule for 2 weeks at the [facility] office  ? ?Follow-Up: ?At [facility], you and your health needs are our priority.  As part of our continuing mission to provide you with exceptional heart care, we have created designated Provider Care Teams.  These Care Teams include your primary Cardiologist (physician) and Advanced Practice Providers (APPs -  Physician Assistants and Nurse Practitioners) who all work together to provide you with the care you need, when you need it. ? ?Your next appointment:   ?6 month(s) ? ?The format for your next appointment:   ?In Person ? ?Provider:   ?Sanda Klein, MD   ? ?Other Instructions ? ? ?

## 2021-10-26 NOTE — Progress Notes (Signed)
?Cardiology Office Note:   ? ?Date:  10/28/2021  ? ?ID:  Rachel Kim, DOB June 03, 1956, MRN [id number] ? ?PCP:  Judi Saa, MD ?  ?[facility] Providers ?Cardiologist:  Sanda Klein, MD    ? ?Referring MD: Judi Saa, MD  ? ?Chief Complaint  ?Patient presents with  ? Follow-up  ? Chest Pain  ? ? ?History of Present Illness:   ? ?Rachel Kim is a 66 y.o. female with a hx of morbid obesity, hypertension, hyperlipidemia, DM2 on insulin, CKD, obstructive sleep apnea, sarcoidosis of lung and RBBB.  Echocardiogram obtained on 11/26/2014 showed EF 60 to 65%, normal wall motion, grade 1 DD, mild MR.  Renal artery Doppler obtained in April 2016 did not show significant renal artery stenosis bilaterally.  She had normal nuclear stress test in December 2020.  She is being followed by Dr. Justin Mend of nephrology service for CKD.  She has quite extensive psoriasis and psoriatic arthritis for which she received Humira.  She also has a history of ischemic colitis however no recent symptoms. ? ?Patient presents today for follow-up and for evaluation of chest discomfort.  She described substernal chest pressure without radiation that occurred once a month for the past 3 months.  So far this year, she has had 3 episodes.  This is actually not the first time she has such chest pressure.  She says in the past, she had intermittent chest pressure that occurs once every several years.  All 3 episodes occurred at rest.  She has been able to walk around in the supermarket without any issue.  There is no exacerbating or relieving factors to her chest discomfort.  It usually resolve by itself after a few minutes.  Given the atypical nature of the chest discomfort, I recommend a Myoview.  Her blood pressure is elevated.  I recommended increasing indapamide to daily dosing instead of every other day.  Most recent blood work and [facility] on 10/14/2021 suggested creatinine 1.42.  She will need a repeat basic metabolic panel in 2 weeks to make  sure her electrolyte and renal function stays the same.  Recent lipid panel obtained on 10/14/2021 showed HDL 69, LDL 75, total cholesterol 160, triglyceride 87.  Her cholesterol seems to have significant improvement when compared to the previous lab work.  I congratulated her on this.  If Myoview looks good and the renal function is stable, she can follow-up with Dr. Sallyanne Kuster in 6 months. ? ?Past Medical History:  ?Diagnosis Date  ? Acute on chronic respiratory failure with hypoxia ([place]) 11/27/2014  ? Anxiety   ? Chronic kidney disease (CKD), stage III (moderate) ([facility])   ? CSA (central sleep apnea)   ? Depression   ? Diabetes mellitus   ? GERD (gastroesophageal reflux disease)   ? H/O breast biopsy 02/10/10  ? calcification , no tumors  ? High cholesterol   ? Hyperlipidemia   ? Hypertension   ? Hyperthyroidism   ? diagnosed 1989  ? LVH (left ventricular hypertrophy)   ? echo 02/25/10 EF >55%  ? OSA (obstructive sleep apnea)   ? severe complex apnea  ? Pneumonia 04/04  ? RBBB   ? Sarcoidosis of lung ([place])   ? Sleep apnea   ? uses CPAP  ? Thyroid disease   ? hypothyroidism  ? ? ?Past Surgical History:  ?Procedure Laterality Date  ? ABDOMINAL HYSTERECTOMY  12/07  ? APPENDECTOMY  1970  ? CARPAL TUNNEL RELEASE    ? right and left  ?  CHOLECYSTECTOMY  1998  ? [place]  ? ELBOW FRACTURE SURGERY    ? with pins and pins removed  ? laminectomy c5-c6  2001  ? laminectomy l5, s1    ? [place]  ? RETROPERITONEAL LYMPH NODE EXCISION    ? right underarm  ? RHINOPLASTY  1985  ? has had several revisions  ? rt knee arthroscopy  1997  ? TONSILLECTOMY  1960  ? tube placement L ear Left 07/29/2019  ? tubinates trimmed  1986  ? ? ?Current Medications: ?Current Meds  ?Medication Sig  ? ACCU-CHEK GUIDE test strip in the morning, at noon, in the evening, and at bedtime.  ? Accu-Chek Softclix Lancets lancets in the morning, at noon, in the evening, and at bedtime.  ? aspirin 81 MG tablet Take 1 tablet (81 mg total) by mouth  daily.  ? Aspirin-Acetaminophen-Caffeine (MIGRAINE RELIEF PO) Take 1 tablet by mouth as needed.  ? atorvastatin (LIPITOR) 20 MG tablet TAKE 1 TABLET BY MOUTH DAILY.  ? benazepril (LOTENSIN) 40 MG tablet Take 1 tablet (40 mg total) by mouth daily.  ? Cholecalciferol (VITAMIN D3) 75 MCG (3000 UT) TABS Take 3,000 Units by mouth daily.  ? clobetasol (TEMOVATE) 0.05 % external solution Apply topically once a week.  ? Dulaglutide (TRULICITY) 5.45 GY/5.6LS SOPN Inject into the skin.  ? FLUoxetine (PROZAC) 20 MG capsule Take one capsule (20 mg dose) by mouth daily.  ? fluticasone (FLONASE) 50 MCG/ACT nasal spray Place 2 sprays into both nostrils daily.  ? insulin lispro protamine-lispro (HUMALOG 75/25 MIX) (75-25) 100 UNIT/ML SUSP injection Inject into the skin.  ? labetalol (NORMODYNE) 100 MG tablet Take 1 tablet (100 mg total) by mouth 3 (three) times daily. (Patient taking differently: Take 100 mg by mouth 2 (two) times daily.)  ? levothyroxine (SYNTHROID) 112 MCG tablet Take 112 mcg by mouth daily before breakfast.  ? liothyronine (CYTOMEL) 25 MCG tablet Take 25 mcg by mouth daily.  ? Multiple Vitamins-Minerals (PRESERVISION AREDS 2 PO) Take 2 capsules by mouth daily.   ? pantoprazole (PROTONIX) 40 MG tablet Take 40 mg by mouth daily.  ? Polyethyl Glycol-Propyl Glycol (SYSTANE) 0.4-0.3 % SOLN Place 2 drops into both eyes 2 (two) times daily.  ? ramelteon (ROZEREM) 8 MG tablet Take 8 mg by mouth at bedtime.  ? Secukinumab (COSENTYX) 150 MG/ML SOSY Inject into the skin.  ? traZODone (DESYREL) 100 MG tablet Take 150 mg by mouth.  ? [DISCONTINUED] indapamide (LOZOL) 1.25 MG tablet Take 1 tablet (1.25 mg total) by mouth every other day.  ?  ? ?Allergies:   Amlodipine, Doxazosin, Glipizide, Labetalol, Pioglitazone, Actos [pioglitazone hydrochloride], Cozaar [losartan potassium], Imdur [isosorbide dinitrate], and Clindamycin hcl  ? ?Social History  ? ?Socioeconomic History  ? Marital status: Single  ?  Spouse name: n/a  ?  Number of children: 0  ? Years of education: Master's  ? Highest education level: Not on file  ?Occupational History  ? Occupation: Corporate investment banker  ?Tobacco Use  ? Smoking status: Never  ? Smokeless tobacco: Never  ?Vaping Use  ? Vaping Use: Never used  ?Substance and Sexual Activity  ? Alcohol use: No  ? Drug use: No  ? Sexual activity: Not Currently  ?  Partners: Male  ?  Birth control/protection: Surgical  ?  Comment: hysterectomy  ?Other Topics Concern  ? Not on file  ?Social History Narrative  ? Lives alone with 2 kitties.  ? Unemployed currently - she  had a temporary job that ended in Dec 2018  ? Retired   ?   ?   ? ?Social Determinants of Health  ? ?Financial Resource Strain: Not on file  ?Food Insecurity: Not on file  ?Transportation Needs: Not on file  ?Physical Activity: Not on file  ?Stress: Not on file  ?Social Connections: Not on file  ?  ? ?Family History: ?The patient's family history includes Colitis in her father; Colon cancer in her maternal grandmother; Hypertension in her mother; Stroke in her mother; Thyroid disease in her brother, brother, father, mother, sister, and sister. ? ?ROS:   ?Please see the history of present illness.    ? All other systems reviewed and are negative. ? ?EKGs/Labs/Other Studies Reviewed:   ? ?The following studies were reviewed today: ? ?Myoview 07/12/2019 ?The left ventricular ejection fraction is hyperdynamic (>65%). ?Nuclear stress EF: 72%. No wall motion abnormalities. ?There was no ST segment deviation noted during stress. ?Subtle anterior defect seen at both rest and stress-breast attenuation artifact ?The study is normal. There are no obvious perfusion defects identified. ? ?EKG:  EKG is ordered today.  The ekg ordered today demonstrates sinus rhythm, right bundle branch block with left anterior fascicular block. ? ?Recent Labs: ?No results found for requested labs within last 8760 hours.  ?Recent Lipid Panel ?   ?Component Value Date/Time  ? CHOL 149  02/19/2021 1512  ? TRIG 154 (H) 02/19/2021 1512  ? HDL 38 (L) 02/19/2021 1512  ? CHOLHDL 3.9 02/19/2021 1512  ? CHOLHDL 5.1 (H) 04/18/2016 1729  ? VLDL 34 (H) 04/18/2016 1729  ? [place] 84 02/19/2021 1512  ? ? ? ?Ri

## 2021-11-05 ENCOUNTER — Telehealth (HOSPITAL_COMMUNITY): Payer: Self-pay | Admitting: *Deleted

## 2021-11-05 NOTE — Telephone Encounter (Signed)
Left message on voicemail per DPR in reference to upcoming appointment scheduled on 11/11/2021 at 10:45 with detailed instructions given per Myocardial Perfusion Study Information Sheet for the test. LM to arrive 15 minutes early, and that it is imperative to arrive on time for appointment to keep from having the test rescheduled. If you need to cancel or reschedule your appointment, please call the office within 24 hours of your appointment. Failure to do so may result in a cancellation of your appointment, and a $50 no show fee. Phone number given for call back for any questions.  ? ?

## 2021-11-10 LAB — BASIC METABOLIC PANEL
BUN/Creatinine Ratio: 16 (ref 12–28)
BUN: 26 mg/dL (ref 8–27)
CO2: 29 mmol/L (ref 20–29)
Calcium: 9.4 mg/dL (ref 8.7–10.3)
Chloride: 102 mmol/L (ref 96–106)
Creatinine, Ser: 1.59 mg/dL — ABNORMAL HIGH (ref 0.57–1.00)
Glucose: 92 mg/dL (ref 70–99)
Potassium: 4.5 mmol/L (ref 3.5–5.2)
Sodium: 145 mmol/L — ABNORMAL HIGH (ref 134–144)
eGFR: 36 mL/min/{1.73_m2} — ABNORMAL LOW (ref >59)

## 2021-11-11 ENCOUNTER — Ambulatory Visit (HOSPITAL_COMMUNITY): Payer: 59 | Attending: Internal Medicine

## 2021-11-11 VITALS — Ht 64.5 in | Wt 208.0 lb

## 2021-11-11 DIAGNOSIS — R079 Chest pain, unspecified: Secondary | ICD-10-CM | POA: Diagnosis present

## 2021-11-11 DIAGNOSIS — R112 Nausea with vomiting, unspecified: Secondary | ICD-10-CM | POA: Diagnosis present

## 2021-11-11 LAB — MYOCARDIAL PERFUSION IMAGING
LV dias vol: 124 mL (ref 46–106)
LV sys vol: 66 mL
Nuc Stress EF: 47 %
Peak HR: 148 {beats}/min
Rest HR: 69 {beats}/min
Rest Nuclear Isotope Dose: 10.8 mCi
SDS: 1
SRS: 5
SSS: 6
ST Depression (mm): 0 mm
Stress Nuclear Isotope Dose: 31.5 mCi
TID: 1.01

## 2021-11-11 MED ORDER — TECHNETIUM TC 99M TETROFOSMIN IV KIT
10.8000 | PACK | Freq: Once | INTRAVENOUS | Status: AC | PRN
  Administered 2021-11-11: 10.8 via INTRAVENOUS
  Filled 2021-11-11: qty 11

## 2021-11-11 MED ORDER — AMINOPHYLLINE 25 MG/ML IV SOLN
75.0000 mg | Freq: Once | INTRAVENOUS | Status: AC
  Administered 2021-11-11: 75 mg via INTRAVENOUS

## 2021-11-11 MED ORDER — TECHNETIUM TC 99M TETROFOSMIN IV KIT
31.5000 | PACK | Freq: Once | INTRAVENOUS | Status: AC | PRN
  Administered 2021-11-11: 31.5 via INTRAVENOUS
  Filled 2021-11-11: qty 32

## 2021-11-11 MED ORDER — REGADENOSON 0.4 MG/5ML IV SOLN
0.4000 mg | Freq: Once | INTRAVENOUS | Status: AC
  Administered 2021-11-11: 0.4 mg via INTRAVENOUS

## 2021-11-16 ENCOUNTER — Telehealth: Payer: Self-pay

## 2021-11-16 NOTE — Telephone Encounter (Signed)
Patient is returning call.  °

## 2021-11-16 NOTE — Telephone Encounter (Addendum)
Left voice message for patient to give office a call back. ? ? ----- Message from Almyra Deforest, [place] sent at 11/16/2021  7:22 AM EDT ----- ?No sign of significant reversible blockage, small area of dark spot that is fixed and not reversible, overall the test is reassuring. But the calculated ejection fraction is lower than expected. This type of stress test is not the best study to check pumping function, recommend a repeat echocardiogram ?

## 2021-11-17 ENCOUNTER — Ambulatory Visit: Payer: Medicaid Other | Admitting: Family Medicine

## 2021-11-17 DIAGNOSIS — I519 Heart disease, unspecified: Secondary | ICD-10-CM

## 2021-11-17 DIAGNOSIS — R079 Chest pain, unspecified: Secondary | ICD-10-CM

## 2021-11-26 DIAGNOSIS — I10 Essential (primary) hypertension: Secondary | ICD-10-CM

## 2021-11-26 MED ORDER — INDAPAMIDE 1.25 MG PO TABS: 1.2500 mg | ORAL_TABLET | Freq: Every day | ORAL | 1 refills | Status: AC

## 2021-12-02 ENCOUNTER — Ambulatory Visit (HOSPITAL_COMMUNITY): Payer: 59 | Attending: Cardiology

## 2021-12-02 DIAGNOSIS — I519 Heart disease, unspecified: Secondary | ICD-10-CM | POA: Insufficient documentation

## 2021-12-02 DIAGNOSIS — R079 Chest pain, unspecified: Secondary | ICD-10-CM | POA: Insufficient documentation

## 2021-12-02 LAB — ECHOCARDIOGRAM COMPLETE
Area-P 1/2: 2.96 cm2
S' Lateral: 3 cm

## 2021-12-13 NOTE — Telephone Encounter (Signed)
Closing note 

## 2021-12-28 ENCOUNTER — Other Ambulatory Visit: Payer: Self-pay

## 2021-12-28 MED ORDER — BENAZEPRIL HCL 40 MG PO TABS: 40.0000 mg | ORAL_TABLET | Freq: Every day | ORAL | 1 refills | Status: DC

## 2021-12-29 ENCOUNTER — Other Ambulatory Visit: Payer: Self-pay

## 2021-12-29 DIAGNOSIS — I10 Essential (primary) hypertension: Secondary | ICD-10-CM

## 2021-12-29 MED ORDER — LABETALOL HCL 100 MG PO TABS: 100.0000 mg | ORAL_TABLET | Freq: Three times a day (TID) | ORAL | 3 refills | Status: AC

## 2022-01-07 ENCOUNTER — Encounter: Payer: Self-pay | Admitting: Cardiovascular Disease

## 2022-01-12 ENCOUNTER — Other Ambulatory Visit: Payer: Self-pay | Admitting: *Deleted

## 2022-01-12 MED ORDER — BENAZEPRIL HCL 40 MG PO TABS: 40.0000 mg | ORAL_TABLET | Freq: Every day | ORAL | 1 refills | Status: AC

## 2022-01-12 MED ORDER — ATORVASTATIN CALCIUM 20 MG PO TABS: 20.0000 mg | ORAL_TABLET | Freq: Every day | ORAL | 1 refills | Status: DC

## 2022-04-28 ENCOUNTER — Ambulatory Visit: Payer: 59 | Admitting: Cardiovascular Disease

## 2022-09-19 ENCOUNTER — Other Ambulatory Visit: Payer: Self-pay

## 2022-09-19 MED ORDER — ATORVASTATIN CALCIUM 20 MG PO TABS: 20.0000 mg | ORAL_TABLET | Freq: Every day | ORAL | 1 refills | Status: AC
